# Patient Record
Sex: Female | Born: 1970 | Race: Black or African American | Hispanic: No | Marital: Single | Smoking: Never smoker
Health system: Southern US, Community
[De-identification: ages and names within clinical notes are randomized; demographics above are authoritative.]

## PROBLEM LIST (undated history)

## (undated) DIAGNOSIS — N938 Other specified abnormal uterine and vaginal bleeding: Secondary | ICD-10-CM

## (undated) DIAGNOSIS — K5900 Constipation, unspecified: Secondary | ICD-10-CM

## (undated) DIAGNOSIS — E119 Type 2 diabetes mellitus without complications: Secondary | ICD-10-CM

## (undated) DIAGNOSIS — F3281 Premenstrual dysphoric disorder: Secondary | ICD-10-CM

## (undated) DIAGNOSIS — F32A Depression, unspecified: Secondary | ICD-10-CM

## (undated) DIAGNOSIS — I10 Essential (primary) hypertension: Secondary | ICD-10-CM

## (undated) DIAGNOSIS — E785 Hyperlipidemia, unspecified: Secondary | ICD-10-CM

## (undated) DIAGNOSIS — D219 Benign neoplasm of connective and other soft tissue, unspecified: Secondary | ICD-10-CM

## (undated) DIAGNOSIS — F329 Major depressive disorder, single episode, unspecified: Secondary | ICD-10-CM

## (undated) HISTORY — DX: Other specified abnormal uterine and vaginal bleeding: N93.8

## (undated) HISTORY — DX: Essential (primary) hypertension: I10

## (undated) HISTORY — DX: Depression, unspecified: F32.A

## (undated) HISTORY — DX: Type 2 diabetes mellitus without complications: E11.9

## (undated) HISTORY — DX: Constipation, unspecified: K59.00

## (undated) HISTORY — DX: Major depressive disorder, single episode, unspecified: F32.9

## (undated) HISTORY — DX: Benign neoplasm of connective and other soft tissue, unspecified: D21.9

## (undated) HISTORY — DX: Premenstrual dysphoric disorder: F32.81

---

## 2006-02-01 HISTORY — PX: OTHER SURGICAL HISTORY: SHX169

## 2007-02-02 HISTORY — PX: APPENDECTOMY: SHX54

## 2008-10-02 ENCOUNTER — Ambulatory Visit: Payer: Self-pay | Admitting: Obstetrics & Gynecology

## 2009-02-06 ENCOUNTER — Ambulatory Visit: Payer: Self-pay | Admitting: Obstetrics and Gynecology

## 2009-02-07 ENCOUNTER — Ambulatory Visit (HOSPITAL_COMMUNITY): Admission: RE | Admit: 2009-02-07 | Discharge: 2009-02-07 | Payer: Self-pay | Admitting: Family Medicine

## 2009-02-20 ENCOUNTER — Other Ambulatory Visit: Admission: RE | Admit: 2009-02-20 | Discharge: 2009-02-20 | Payer: Self-pay | Admitting: Obstetrics & Gynecology

## 2009-02-20 ENCOUNTER — Ambulatory Visit: Payer: Self-pay | Admitting: Obstetrics and Gynecology

## 2009-03-06 ENCOUNTER — Ambulatory Visit: Payer: Self-pay | Admitting: Obstetrics and Gynecology

## 2009-03-25 ENCOUNTER — Ambulatory Visit: Payer: Self-pay | Admitting: Family Medicine

## 2009-04-28 ENCOUNTER — Encounter: Admission: RE | Admit: 2009-04-28 | Discharge: 2009-04-28 | Payer: Self-pay | Admitting: Family Medicine

## 2009-04-28 ENCOUNTER — Telehealth (INDEPENDENT_AMBULATORY_CARE_PROVIDER_SITE_OTHER): Payer: Self-pay | Admitting: *Deleted

## 2009-04-28 ENCOUNTER — Ambulatory Visit: Payer: Self-pay | Admitting: Family Medicine

## 2009-04-28 DIAGNOSIS — M545 Low back pain, unspecified: Secondary | ICD-10-CM | POA: Insufficient documentation

## 2009-04-28 DIAGNOSIS — M549 Dorsalgia, unspecified: Secondary | ICD-10-CM | POA: Insufficient documentation

## 2009-04-28 DIAGNOSIS — M19019 Primary osteoarthritis, unspecified shoulder: Secondary | ICD-10-CM | POA: Insufficient documentation

## 2009-05-02 ENCOUNTER — Encounter (INDEPENDENT_AMBULATORY_CARE_PROVIDER_SITE_OTHER): Payer: Self-pay | Admitting: *Deleted

## 2009-05-15 ENCOUNTER — Telehealth: Payer: Self-pay | Admitting: Family Medicine

## 2009-05-21 ENCOUNTER — Ambulatory Visit: Payer: Self-pay | Admitting: Family Medicine

## 2009-05-21 ENCOUNTER — Encounter: Payer: Self-pay | Admitting: Family Medicine

## 2009-05-21 LAB — CONVERTED CEMR LAB
Cholesterol: 264 mg/dL — ABNORMAL HIGH (ref 0–200)
HDL: 50 mg/dL (ref >39)
LDL Cholesterol: 183 mg/dL — ABNORMAL HIGH (ref 0–99)
Triglycerides: 156 mg/dL — ABNORMAL HIGH (ref <150)

## 2009-05-26 ENCOUNTER — Ambulatory Visit: Payer: Self-pay | Admitting: Family Medicine

## 2009-06-03 ENCOUNTER — Encounter: Admission: RE | Admit: 2009-06-03 | Discharge: 2009-06-26 | Payer: Self-pay | Admitting: Family Medicine

## 2009-06-09 ENCOUNTER — Telehealth: Payer: Self-pay | Admitting: Family Medicine

## 2009-06-09 DIAGNOSIS — E785 Hyperlipidemia, unspecified: Secondary | ICD-10-CM | POA: Insufficient documentation

## 2009-07-25 ENCOUNTER — Encounter: Payer: Self-pay | Admitting: *Deleted

## 2009-08-13 ENCOUNTER — Ambulatory Visit (HOSPITAL_COMMUNITY): Payer: Self-pay | Admitting: Psychology

## 2009-08-20 ENCOUNTER — Ambulatory Visit (HOSPITAL_COMMUNITY): Payer: Self-pay | Admitting: Psychology

## 2009-09-05 ENCOUNTER — Ambulatory Visit: Payer: Self-pay | Admitting: Obstetrics & Gynecology

## 2009-10-10 ENCOUNTER — Ambulatory Visit: Payer: Self-pay | Admitting: Obstetrics & Gynecology

## 2009-10-10 ENCOUNTER — Telehealth: Payer: Self-pay | Admitting: Psychology

## 2009-11-03 ENCOUNTER — Telehealth: Payer: Self-pay | Admitting: Psychology

## 2009-11-25 ENCOUNTER — Ambulatory Visit (HOSPITAL_COMMUNITY): Payer: Self-pay | Admitting: Psychiatry

## 2009-12-04 ENCOUNTER — Ambulatory Visit (HOSPITAL_COMMUNITY): Payer: Self-pay | Admitting: Psychology

## 2009-12-24 ENCOUNTER — Ambulatory Visit (HOSPITAL_COMMUNITY)
Admission: RE | Admit: 2009-12-24 | Discharge: 2009-12-24 | Payer: Self-pay | Source: Home / Self Care | Admitting: Family Medicine

## 2009-12-24 ENCOUNTER — Ambulatory Visit: Payer: Self-pay | Admitting: Family Medicine

## 2009-12-24 ENCOUNTER — Encounter: Payer: Self-pay | Admitting: Family Medicine

## 2009-12-24 DIAGNOSIS — Z8679 Personal history of other diseases of the circulatory system: Secondary | ICD-10-CM | POA: Insufficient documentation

## 2009-12-24 DIAGNOSIS — N943 Premenstrual tension syndrome: Secondary | ICD-10-CM | POA: Insufficient documentation

## 2009-12-24 DIAGNOSIS — B80 Enterobiasis: Secondary | ICD-10-CM

## 2009-12-24 LAB — CONVERTED CEMR LAB
ALT: 26 units/L (ref 0–35)
AST: 26 units/L (ref 0–37)
Alkaline Phosphatase: 64 units/L (ref 39–117)
Bilirubin, Direct: 0.1 mg/dL (ref 0.0–0.3)
Total Bilirubin: 0.7 mg/dL (ref 0.3–1.2)
Total Protein: 6.9 g/dL (ref 6.0–8.3)

## 2009-12-30 ENCOUNTER — Ambulatory Visit (HOSPITAL_COMMUNITY): Payer: Self-pay | Admitting: Psychology

## 2010-01-05 ENCOUNTER — Telehealth: Payer: Self-pay | Admitting: Family Medicine

## 2010-01-16 ENCOUNTER — Ambulatory Visit (HOSPITAL_COMMUNITY): Payer: Self-pay | Admitting: Psychiatry

## 2010-01-16 ENCOUNTER — Ambulatory Visit: Payer: Self-pay | Admitting: Family Medicine

## 2010-01-16 DIAGNOSIS — M79609 Pain in unspecified limb: Secondary | ICD-10-CM | POA: Insufficient documentation

## 2010-01-16 DIAGNOSIS — R109 Unspecified abdominal pain: Secondary | ICD-10-CM | POA: Insufficient documentation

## 2010-01-28 ENCOUNTER — Ambulatory Visit: Payer: Self-pay | Admitting: Family Medicine

## 2010-02-06 ENCOUNTER — Ambulatory Visit: Admit: 2010-02-06 | Payer: Self-pay | Admitting: Obstetrics & Gynecology

## 2010-02-09 ENCOUNTER — Ambulatory Visit (HOSPITAL_BASED_OUTPATIENT_CLINIC_OR_DEPARTMENT_OTHER)
Admission: RE | Admit: 2010-02-09 | Discharge: 2010-02-09 | Payer: Self-pay | Source: Home / Self Care | Attending: Family Medicine | Admitting: Family Medicine

## 2010-02-09 ENCOUNTER — Ambulatory Visit
Admission: RE | Admit: 2010-02-09 | Discharge: 2010-02-09 | Payer: Self-pay | Source: Home / Self Care | Attending: Obstetrics and Gynecology | Admitting: Obstetrics and Gynecology

## 2010-02-12 ENCOUNTER — Ambulatory Visit (HOSPITAL_COMMUNITY)
Admission: RE | Admit: 2010-02-12 | Discharge: 2010-02-12 | Payer: Self-pay | Source: Home / Self Care | Attending: Obstetrics and Gynecology | Admitting: Obstetrics and Gynecology

## 2010-02-23 ENCOUNTER — Ambulatory Visit: Admit: 2010-02-23 | Payer: Self-pay

## 2010-03-02 ENCOUNTER — Ambulatory Visit (HOSPITAL_COMMUNITY): Admit: 2010-03-02 | Payer: Self-pay | Admitting: Psychology

## 2010-03-03 NOTE — Progress Notes (Signed)
Summary: results  Phone Note Call from Patient Call back at Home Phone [phone]   Caller: Patient Summary of Call: pt is wanting results of labs  Initial call taken by: De Nurse,  Jun 09, 2009 3:20 PM  Follow-up for Phone Call        discussed the results of her lipid test. gave her suggestions & examples of how she can lower her cholesterol. urged daily exercise even if it is only 10 minutes in am & in pm. weighs about 170 per pt.   told her mds usually send a letter outlining the results & giving examples. to pcp to send this letter  she will look this up on her computer & get further examples of how to cut cholesterol Follow-up by: Golden Circle RN,  Jun 09, 2009 3:27 PM  Additional Follow-up for Phone Call Additional follow up Details #1::        Discussed results of labs w/ pt as well as plans diet/lifestyle changes in addition to likely starting statin in near future w/ planned CMET prior to starting to evaluate LFTs. Pt agreeable to plan.  Doree Albee MD   New Problems: HYPERLIPIDEMIA (ICD-272.4)   New Problems: HYPERLIPIDEMIA (ICD-272.4)

## 2010-03-03 NOTE — Progress Notes (Signed)
  Phone Note Outgoing Call   Summary of Call: Jevonte Clanton plz call her and tell her xrays were normal Thanks!  Denny Levy MD  April 28, 2009 4:28 PM

## 2010-03-03 NOTE — Progress Notes (Signed)
Summary: labs  Phone Note Call from Patient Call back at Home Phone [phone]   Caller: Patient Summary of Call: would like labs for her chlolesterol & other things that were discussed at last visit.  she made an appt for 4/20 for her labs and would like orders put in for her.  pls advise Initial call taken by: De Nurse,  May 15, 2009 1:33 PM  Follow-up for Phone Call        Lipid prrofile placed in centricity. Plan to see pt on 4/20. Thank You. Doree Albee MD  New Problems: HEALTH SCREENING (ICD-V70.0)   New Problems: HEALTH SCREENING (ICD-V70.0)

## 2010-03-03 NOTE — Progress Notes (Signed)
Summary: Lab Followup  Phone Note Call from Patient   Caller: Patient Call For: [phone] Summary of Call: Please call patient with results of labs recently taken.   Initial call taken by: Abundio Miu,  January 05, 2010 2:01 PM  Follow-up for Phone Call        Call and spoke with pt about lab results. Will start pt on pravachol. Discussed side effect red flags. Will followup in 2 weeks. Pt agreeable to plan.  Doree Albee MD January 05, 2010 3:52 PM      New/Updated Medications: PRAVACHOL 40 MG TABS (PRAVASTATIN SODIUM) 1 tab daily Prescriptions: PRAVACHOL 40 MG TABS (PRAVASTATIN SODIUM) 1 tab daily  #30 x 6   Entered and Authorized by:   Doree Albee MD   Signed by:   Doree Albee MD on 01/05/2010   Method used:   Electronically to        [facility] [address] (retail)       [address]       [place], [place]  [address]       Ph: [phone] or [phone]       Fax: [phone]   RxID:   [id number]

## 2010-03-03 NOTE — Progress Notes (Signed)
Summary: Schedule [facility]  Phone Note Call from Patient   Caller: Patient Call For: Spero Geralds, Psy.D. Summary of Call: Patient called to schedule a therapy appt.  She was seeing Dr. Denyse Amass over at [facility] and he recommended it.  We scheduled for first available for a new patient:  Thursday, October 6th at 2:00. Initial call taken by: Spero Geralds PsyD,  October 10, 2009 10:13 AM

## 2010-03-03 NOTE — Assessment & Plan Note (Signed)
Summary: pin worms/muscle chest pain,df   Vital Signs:  Patient profile:   40 year old female Weight:      176.1 pounds Temp:     98.8 degrees F oral Pulse rate:   87 / minute Pulse rhythm:   regular BP sitting:   126 / 86  (left arm) Cuff size:   large  Vitals Entered By: Loralee Pacas CMA (December 24, 2009 10:26 AM) CC: ? pin worms and muscle strain , Lipid Management   Primary Care Provider:  Doree Albee MD  CC:  ? pin worms and muscle strain  and Lipid Management.  History of Present Illness: Mood: Pt reports improved mood with zoloft. has only been taking medication for 2 weeks vs. 2 months as previously prescribed. Feels that irritability associated w/ PMDD is somewhat improved.   Chest pain: pt reports chest pain for last 4-5 days. CP central in nature, assd w/ movement. Non exetional. No N/V/diaphoresis. No radiation. Feels that pain occured after episode of "aggressive sex".   Pin worms: Pt reports anal itching for last 1-2 months. Worse at night and in am. Has had pin worms in the past and feels that sxs are similar. No recent anal intercourse, hx/o external hemmorhoids.   Lipid Management History:      Negative NCEP/ATP III risk factors include female age less than 81 years old, non-tobacco-user status, non-hypertensive, no ASHD (atherosclerotic heart disease), no prior stroke/TIA, no peripheral vascular disease, and no history of aortic aneurysm.        The patient states that she knows about the "Therapeutic Lifestyle Change" diet.  Her compliance with the TLC diet is poor.  The patient expresses understanding of adjunctive measures for cholesterol lowering.  The patient denies any symptoms to suggest myopathy or liver disease.  Comments include: Pt reports eating high fat diet. Has not done some lifestyle modifcation w/ intermittent exercise. .     Habits & Providers  Alcohol-Tobacco-Diet     Tobacco Status: never  Exercise-Depression-Behavior     Have you felt  down or hopeless? no     Have you felt little pleasure in things? no     Depression Counseling: not indicated; screening negative for depression     Seat Belt Use: always  Social History: Risk analyst Use:  always  Physical Exam  General:  alert and well-developed.   Head:  normocephalic and atraumatic.   Eyes:  vision grossly intact.   Ears:  R ear normal and L ear normal.   Nose:  no external deformity.   Mouth:  good dentition.   Neck:  supple and full ROM.   Chest Wall:  + tenderness to palpation to central chest  Lungs:  normal respiratory effort and normal breath sounds.   Heart:  normal rate, regular rhythm, and no murmur.   Abdomen:  S/NT/+ bowel sounds  Additional Exam:  EKG-NSR   Impression & Recommendations:  Problem # 1:  PMDD (ICD-625.4) Mood improved with zoloft. Will reassess in 2-4 weeks. Pt w/ overall history of poor compliance. Stressed importance of compliance with medication for adequate treamtent. Pt agreeable to plan.   Problem # 2:  CHEST PAIN, HX OF (ICD-V12.50) Likely costochondritis. Instructed to use tylenol as NSAIDs cause stomach pain. EKG in office WNL.  Orders: 12 Lead EKG (12 Lead EKG) [facility]- Est Level  3 (57846)  Problem # 3:  HYPERLIPIDEMIA (ICD-272.4) Readdressed diet and lifestyle modification. Will recheck lipids. WIll likely need statin in future.  Orders: Direct LDL-[facility] (69629-52841) [facility]- Est Level  3 (32440)  Problem # 4:  PINWORMS (ICD-127.4) Will rx antihelminthic for tx. Addressed good hygeine as well as possible need for partner tx if sxs recur. Pt agreeable to plan.   Complete Medication List: 1)  Flexeril 10 Mg Tabs (Cyclobenzaprine hcl) .Marland Kitchen.. 1 by mouth  qhs as needed for back spasm 2)  Flexeril 10 Mg Tabs (Cyclobenzaprine hcl) .... Take one half  by mouth  qhs as needed for back spasm 3)  Mebendazole 100 Mg Chew (Mebendazole) .... Take 1 tablet once and in 2 weeks  Other Orders: T-Hepatic Function (10272-53664)  Lipid  Assessment/Plan:      Based on NCEP/ATP III, the patient's risk factor category is "0-1 risk factors".  The patient's lipid goals are as follows: Total cholesterol goal is 200; LDL cholesterol goal is 160; HDL cholesterol goal is 40; Triglyceride goal is 150.    Patient Instructions: 1)  It was good to see you today 2)  Your chest pain is likely from a strain in your chest wall, but we will do an ekg given your history of high cholesterol 3)  You can use tylenol for pain as ibuprofen hurts your stomach 4)  We will also check some cholesterol labs and you may need medical treatment in the future 5)  I will prescribe some medication for pinworms. Use as directed 6)  May sure to wash all of your sheets as well as use very good hand hygiene 7)  Your mood seems to be doing better, continue with the zoloft regimen and come back to see me in 4 weeks 8)  Otherwise call with any questions,  9)  God Bless and Happy Thanksgiving 10)  Doree Albee MD  Prescriptions: MEBENDAZOLE 100 MG CHEW (MEBENDAZOLE) take 1 tablet once and in 2 weeks  #2 x 0   Entered and Authorized by:   Doree Albee MD   Signed by:   Doree Albee MD on 12/29/2009   Method used:   Electronically to        [facility] [address] (retail)       [address]       [place], [place]  [address]       Ph: [phone] or [phone]       Fax: [phone]   RxID:   [id number]    Orders Added: 1)  T-Hepatic Function [phone] 2)  12 Lead EKG [12 Lead EKG] 3)  Direct LDL-[facility] [[id number]] 4)  [facility]- Est Level  3 [83151]     Prevention & Chronic Care Immunizations   Influenza vaccine: Not documented    Tetanus booster: Not documented   Tetanus booster due: 12/01/2011    Pneumococcal vaccine: Not documented  Other Screening   Pap smear: LOW GRADE SQUAMOUS INTRAEPITHELIAL LESION: CIN-1/ VAIN-1/ HPV. (LSIL)  (02/06/2009)    Mammogram: Not documented   Mammogram due:  04/01/2010   Smoking status: never  (12/24/2009)  Lipids   Total Cholesterol: 264  (05/21/2009)   Lipid panel action/deferral: Lipid Panel ordered   LDL: 183  (05/21/2009)   LDL Direct: Not documented   HDL: 50  (05/21/2009)   Triglycerides: 156  (05/21/2009)    SGOT (AST): Not documented   BMP action: Ordered   SGPT (ALT): Not documented   Alkaline phosphatase: Not documented   Total bilirubin: Not documented    Lipid flowsheet reviewed?: Yes  Progress toward LDL goal: Unchanged  Self-Management Support :    Patient will work on the following items until the next clinic visit to reach self-care goals:     Eating: drink diet soda or water instead of juice or soda, eat more vegetables, eat foods that are low in salt, eat baked foods instead of fried foods, eat fruit for snacks and desserts, limit or avoid alcohol  (12/24/2009)     Activity: take a 30 minute walk every day  (12/24/2009)    Lipid self-management support: Referred for medical nutrition therapy, Referred for medical nutrition therapy  (12/24/2009)    Nursing Instructions: Give Flu vaccine today Refer for medical nutrition therapy (see order) Refer for medical nutrition therapy (see order)    Diabetes Self Management Training Referral Patient Name: Diane Sexton Date Of Birth: 02-23-1970 MRN: [id number] Current Diagnosis:  PINWORMS (ICD-127.4) PMDD (ICD-625.4) CHEST PAIN, HX OF (ICD-V12.50) HYPERLIPIDEMIA (ICD-272.4) HEALTH SCREENING (ICD-V70.0) DEGENERATIVE JOINT DISEASE, 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

## 2010-03-03 NOTE — Assessment & Plan Note (Signed)
Summary: SHOULDER PAIN/MJD   Vital Signs:  Patient profile:   40 year old female BP sitting:   127 / 88  Vitals Entered By: Lillia Pauls CMA (April 28, 2009 2:50 PM)  Primary Care Provider:  Doree Albee MD  CC:  L shoulder pain x 1 mo and LBP x 6 mos.  History of Present Illness: 40 y/o F with L shoulder x 1 mo and back pain x 6 mos  L Shoulder pain:  pain that is constant.  Cannot recall injury.  Cannot sleep because of pain.  Pain is described as throbbing.  Located from clavicle to [facility] joint.  No radiation to arm or hand.  Has tried otc pain meds, but they have not helped.  Has full ROM, no pain with reaching or lifting.    Low back pain: No injury.  Cramping pain that is intermittent.  No urinary or bowel symptoms.  No radiation  to lower legs, no saddle distribution numbness.  Worse when standing still for long period (10-15 minutes).  This pain is relieved with walking.  Pain worse with flexion.  Pain worse after sitting for a long time (1 hr on couch).  Has gained about 12 lbs over the past year and is wondering if that may contribute to the pain.      Past History:  Past Medical History: Last updated: 03/25/2009 Hx/o LGSIL  Hx/o uterine fibroids Hyperlipidemia hx/o PMDD   Past Surgical History: Last updated: 03/25/2009 Uterine artery embolization 2007 Appendectomy 2010  Family History: Last updated: 03/25/2009 Family History Diabetes 1st degree relative Family History Hypertension Hx/o MI in [place]   Social History: Last updated: 03/25/2009 Recently laid off 1/10 Occasional wine use Single Never Smoked Drug use-no Regular exercise-no  Review of Systems       per hpi  Physical Exam  Msk:  L shoulder extreme tenderness over AC joint and this reproduces her pain as does crossover test aggravate pain. rotator cuff muscles are intact for strength, some mild pain w supraspinatus testing. bicep and deltoid function intact. distally neurovascularly  intact  BACK: inspection revealks no deformity. flexion and extension at hips is normal. B SLR in seated and supine position negative. Area of tenderness to deep palpation around T 12-L2.lateral side bendng limited secondary to tight mscles. LE strength at hip flexors and extensors normal. 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

## 2010-03-03 NOTE — Assessment & Plan Note (Signed)
Summary: Diane Sexton,Diane Sexton   Vital Signs:  Patient profile:   40 year old female Height:      65 inches Weight:      177 pounds BMI:     29.56 BSA:     1.88 Temp:     98.4 degrees F Pulse rate:   114 / minute BP sitting:   139 / 82  Vitals Entered By: Jone Baseman CMA (March 25, 2009 2:09 PM)  Serial Vital Signs/Assessments:  Comments: 2:11 PM Manual BP: 122/80 By: Jone Baseman CMA   CC: new patient Is Patient Diabetic? No Pain Assessment Patient in pain? no        CC:  new patient.  History of Present Illness: Diane Sexton here for new pt appt. Pt currently w/o specific complaints. Pt w/ healthcare in [place] previously.  Pt denies any hx/o HTN, DM, Heart disease. Pt does report hx/o hyperlipidemia to which has been intermittently treated w/ statin medication. Pt up to date on pap smear, mammogram, and tetanus shot per pt. Pt transitioning care as pt has been unemployed for greater than 1 year and has lost previous insurance coverage. Pt does report 10-20+ weight gain as pt has been inactive since being laid off from work.   Habits & Providers  Alcohol-Tobacco-Diet     Tobacco Status: never  Exercise-Depression-Behavior     Does Patient Exercise: no     Drug Use: no  Past History:  Past Medical History: Hx/o LGSIL  Hx/o uterine fibroids Hyperlipidemia hx/o PMDD   Past Surgical History: Uterine artery embolization 2007 Appendectomy 2010  Family History: Family History Diabetes 1st degree relative Family History Hypertension Hx/o MI in [place]   Social History: Recently laid off 1/10 Occasional wine use Single Never Smoked Drug use-no Regular exercise-no Smoking Status:  never Drug Use:  no Does Patient Exercise:  no  Review of Systems       The patient complains of weight gain.  The patient denies anorexia, vision loss, decreased hearing, chest pain, syncope, dyspnea on exertion, peripheral edema, headaches, abdominal pain, melena, severe  indigestion/heartburn, incontinence, depression, abnormal bleeding, and angioedema.    Physical Exam  General:  alert, well-developed, and well-nourished.   Head:  normocephalic and atraumatic.   Eyes:  vision grossly intact.   Ears:  R ear normal and L ear normal.   Nose:  no external deformity.   Mouth:  good dentition and pharynx pink and moist.   Neck:  supple and full ROM.   Chest Wall:  no deformities.   Lungs:  normal respiratory effort, no accessory muscle use, and normal breath sounds.   Heart:  normal rate, regular rhythm, no murmur, no gallop, no rub, and no JVD.   Abdomen:  soft, non-tender, normal bowel sounds, no distention, no masses, no guarding, and no rigidity.   Msk:  normal ROM, no joint tenderness, no joint swelling, no joint warmth, and no redness over joints.   Extremities:  No edema Neurologic:  alert & oriented X3, cranial nerves II-XII intact, and strength normal in all extremities.   Skin:  turgor normal.   Cervical Nodes:  no anterior cervical adenopathy.   Axillary Nodes:  no R axillary adenopathy and no L axillary adenopathy.   Psych:  Oriented X3 and memory intact for recent and remote.     Impression & Recommendations:  Problem # 1:  Preventive Health Care (ICD-V70.0) Pt presenting asymptomatic w/ medical screening up to date per [facility] preventative care protocol. Will  plan to obtain lipid panel as pt in setting of hx/o HLD has not been on medication for >1 year and no lipid check in several years per pt. Repeat BP in clinic non concering for HTN in setting of pt denying hx/o. Will also obtain records from previous PCP. In setting of hx/o LGSIL, uterine fibroids, as well as PMDD, pt currently actively seeing [facility] for GYN care. Plan to follow OB-Gyn recomendations. Will aslo plan for pt tp followup w/ nutrition in next 1-2 months for advisement in diet and overall lifestyle management.    Prevention & Chronic Care Immunizations   Influenza  vaccine: Not documented    Tetanus booster: Not documented   Tetanus booster due: 12/01/2011    Pneumococcal vaccine: Not documented  Other Screening   Pap smear: LOW GRADE SQUAMOUS INTRAEPITHELIAL LESION: CIN-1/ VAIN-1/ HPV. (LSIL)  (02/06/2009)    Mammogram: Not documented   Smoking status: never  (03/25/2009)  Lipids   Total Cholesterol: Not documented   LDL: Not documented   LDL Direct: Not documented   HDL: Not documented   Triglycerides: Not documented

## 2010-03-03 NOTE — Assessment & Plan Note (Signed)
Summary: BP check  Nurse Visit patient in for labs and requested BP check. BP checked manually using regular adult cuff. BP LA 120/92 , RA 118/92, pulse 80. will forward message to MD. Theresia Lo RN  May 21, 2009 9:03 AM   Orders Added: 1)  No Charge Patient Arrived (NCPA0) [[id number]]

## 2010-03-03 NOTE — Progress Notes (Signed)
Summary: Diane Sexton appt  Phone Note Call from Patient   Caller: Patient Call For: Spero Geralds, Psy.D. Summary of Call: Left a VM today to cancel her Thursday appt.  I called her back to reschedule, per her request, and left a VM. Initial call taken by: Spero Geralds PsyD,  November 03, 2009 12:12 PM

## 2010-03-03 NOTE — Letter (Signed)
Summary: [facility] Ref[facility] form  [facility] Referral form   Imported By: Marily Memos 05/27/2009 09:45:17  _____________________________________________________________________  External Attachment:    Type:   Image     Comment:   External Document

## 2010-03-03 NOTE — Assessment & Plan Note (Signed)
Summary: F/U,[facility]   Vital Signs:  Patient profile:   40 year old female BP sitting:   127 / 81  Vitals Entered By: Lillia Pauls CMA (May 26, 2009 1:48 PM)  Primary Care Provider:  Doree Albee MD   History of Present Illness: f/u left shoulder pain DATE of INJURY (approximate): 03/31/2009 f/u back pain DATE of INJURY (approximate): 10/29/2009  1) shoulder---99% better. Hurt for a couple of days after injection then has essentaially totally resolved pain. has FROM.   2) back pain--no better. She did exercises for about 2 weeks and it seemed not to help. Pain is B in thoracolumbar area--worse withextended standing. Some better w the flexeril I gave her and she would like some more of that. no radiation to legs, no change in bowel or bladder habits.   Physical Exam  Msk:  B shoulders FROm (painless)  BACK. tender to palpation on thoraco lumbar spine muscles and tehre is some spasm noted B. Limited hyper-extension to about 15 degrees and increases her pain. 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 Will refer for PT and f/u 4 w ok to continue fllexeril we gave her some samples we had here today (# 20 of 10 mg tabs)  Complete Medication List: 1)  Flexeril 10 Mg Tabs (Cyclobenzaprine hcl) .Marland Kitchen.. 1 by mouth  qhs as needed for back spasm 2)  Flexeril 10 Mg Tabs (Cyclobenzaprine hcl) .... Take one half  by mouth  qhs as needed for back spasm

## 2010-03-03 NOTE — Letter (Signed)
Summary: Results Follow-up Letter  [facility]  [address]   [place], [place] [address]   Phone: [phone]  Fax: [phone]    05/02/2009  [address] [place], [place]  [address]  Dear Ms. Sainsbury,   The following are the results of your recent test(s):  Test     Result     Pap Smear    Normal_______  Not Normal_____       Comments: _________________________________________________________ Cholesterol LDL(Bad cholesterol):          Your goal is less than:         HDL (Good cholesterol):        Your goal is more than: _________________________________________________________ Other Tests:  Your x-rays were normal _________________________________________________________  Please call for an appointment Or _________________________________________________________ _________________________________________________________ _________________________________________________________  Sincerely,  Denny Levy, M.D./Alecxis Baltzell Christell Constant [facility]           Appended Document: Results Follow-up Letter

## 2010-03-03 NOTE — Letter (Signed)
Summary: Probation Letter  [facility]  [address]   [place], [place] [address]   Phone: [phone]  Fax: [phone]    07/25/2009  KAWANDA DRUMHELLER Deg[place] [address] [place], [place]  [address]  Dear Ms. Goeser,  With the goal of better serving all our patients the [facility] is following each patient's missed appointments.  You have missed at least 3 appointments with our practice.If you cannot keep your appointment, we expect you to call at least 24 hours before your appointment time.  Missing appointments prevents other patients from seeing [place] and makes it difficult to provide you with the best possible medical care.      1.   If you miss one more appointment, we will only give you limited medical services. This means we will not call in medication refills, complete a form, or make a referral for you except when you are here for a scheduled office visit.    2.   If you miss 2 or more appointments in the next year, we will dismiss you from our practice.    Our office staff can be reached at [phone] Monday through Friday from 8:30 a.m.-5:00 p.m. and will be glad to schedule your appointment as necessary.    Thank you.   [facility]  Appended Document: Probation Letter mailed

## 2010-03-05 NOTE — Assessment & Plan Note (Signed)
Summary: f/u eo   Vital Signs:  Patient profile:   40 year old female Height:      65 inches Weight:      177.9 pounds BMI:     29.71 Temp:     98.3 degrees F oral Pulse rate:   84 / minute BP sitting:   123 / 80  (right arm) Cuff size:   regular  Vitals Entered By: Jimmy Footman, CMA (January 28, 2010 2:49 PM) CC: follow up pinworm, concerned about family history of Dm Is Patient Diabetic? No Pain Assessment Patient in pain? no        Primary Care Provider:  Doree Albee MD  CC:  follow up pinworm and concerned about family history of Dm.  History of Present Illness: Broke up with partner around 12/10. Had menstrual cycle 12/15. Feels that mood has been otherwise stable, but breakup with partner made patient feel very sad and depressed for approximately 1 week.  Unsure if PMDD played a part in mood and breakup.  Doesnt think that  mood has become worse since starting zoloft, but unsure if mood has become significantly better.   Pinworms: No more anal itching/irritation per pt since completing course of mebendazole.    HLD: has not started pravachol as previously instructed. Was concerned about price since she is unemployed. Has not made significant changes in diet.   DM:  Pt concerned about having diabetes as there is a strong family history. No polyuria/polydypsia per pt. Is desiring blood sugar check.   Habits & Providers  Alcohol-Tobacco-Diet     Tobacco Status: never  Allergies: No Known Drug Allergies  Physical Exam  General:  alert and well-developed.   Head:  atraumatic.   Eyes:  vision grossly intact.   Neck:  supple and full ROM.   Lungs:  CTAB, no wheezes, rales, rhoncii Heart:  RRR, no rubs, gallops, murmurs  Abdomen:  S/non tender/+ bowel sounds  Extremities:  2+ peripheral pulses, no edema Neurologic:  cranial nerves II-XII intact.   Psych:  Oriented X3, normally interactive, and good eye contact.   PHQ-9 score of 5    Impression &  Recommendations:  Problem # 1:  PMDD (ICD-625.4) Will continue with zoloft. Will follow up in 1 month to reassess effect of zoloft on PMDD. Zoloft originally rxd by behavioral health. On subtherapeutic dose currently.  Will likely increase at next visit.  Orders: [facility]- Est  Level 4 (13086)  Problem # 2:  PINWORMS (ICD-127.4) Assessment: Improved Resolved  Orders: [facility]- Est  Level 4 (57846)  Problem # 3:  HYPERLIPIDEMIA (ICD-272.4) Assessment: Unchanged Pt instructed to begin taking pravachol. Told pt that medication is on [facility] 4 dollar list. Will follow up in 1 month. Will likely refer pt to nutrition as next step in management.  Her updated medication list for this problem includes:    Pravachol 40 Mg Tabs (Pravastatin sodium) .Marland Kitchen... 1 tab daily  Orders: [facility]- Est  Level 4 (99214)  Complete Medication List: 1)  Flexeril 10 Mg Tabs (Cyclobenzaprine hcl) .Marland Kitchen.. 1 by mouth  qhs as needed for back spasm 2)  Flexeril 10 Mg Tabs (Cyclobenzaprine hcl) .... Take one half  by mouth  qhs as needed for back spasm 3)  Pravachol 40 Mg Tabs (Pravastatin sodium) .Marland Kitchen.. 1 tab daily 4)  Zoloft 25 Mg Tabs (Sertraline hcl) .Marland Kitchen.. 1 by mouth daily per mental health for pmdd  Other Orders: Glucose Cap-[facility] (96295)  Patient Instructions: 1)  Your mood seems  stable.  2)  Come back in 1 month and we will adress your mood again.  3)  Start taking the pravachol as prescribed  4)  Followup with me in 65month 5)  Otherwise call with any questions, 6)  Happy New Year and God [place], 7)  Doree Albee MD    Orders Added: 1)  [facility]- Est  Level 4 [16109] 2)  Glucose Cap-[facility] [60454]     Prevention & Chronic Care Immunizations   Influenza vaccine: Not documented    Tetanus booster: Not documented   Tetanus booster due: 12/01/2011    Pneumococcal vaccine: Not documented  Other Screening   Pap smear: LOW GRADE SQUAMOUS INTRAEPITHELIAL LESION: CIN-1/ VAIN-1/ HPV. (LSIL)  (02/06/2009)    Mammogram: Not  documented   Mammogram due: 04/01/2010   Smoking status: never  (01/28/2010)  Lipids   Total Cholesterol: 264  (05/21/2009)   Lipid panel action/deferral: Lipid Panel ordered   LDL: 183  (05/21/2009)   LDL Direct: 174  (12/24/2009)   HDL: 50  (05/21/2009)   Triglycerides: 156  (05/21/2009)    SGOT (AST): 26  (12/24/2009)   BMP action: Ordered   SGPT (ALT): 26  (12/24/2009)   Alkaline phosphatase: 64  (12/24/2009)   Total bilirubin: 0.7  (12/24/2009)    Lipid flowsheet reviewed?: Yes   Progress toward LDL goal: Unchanged  Self-Management Support :    Lipid self-management support: Referred for medical nutrition therapy, Referred for medical nutrition therapy  (12/24/2009)     Appended Document: f/u eo CBG at 69 on spot check. WNL. Discussed with pt.  Doree Albee MD

## 2010-03-05 NOTE — Assessment & Plan Note (Signed)
Summary: consistent pain on rt side/eo   Vital Signs:  Patient profile:   40 year old female Height:      65 inches Weight:      172.3 pounds Temp:     98.2 degrees F oral Pulse rate:   75 / minute BP sitting:   118 / 87  (right arm) Cuff size:   large  Vitals Entered By: Arlyss Repress CMA, (January 16, 2010 10:14 AM) CC: pain in right bottom of foot, leg...pain started in right abdomen.  Is Patient Diabetic? No Pain Assessment Patient in pain? yes     Location: right foot Intensity: 1 Onset of pain  x1 week   Primary Care Provider:  Doree Albee MD  CC:  pain in right bottom of foot and leg...pain started in right abdomen. Marland Kitchen  History of Present Illness:    Pain in right side of abdomen started 1 week ago woke her from sleep. Intermittant sharp pain in abdomen feels like stabbing, pain radiates to rigjht thigh and sole of foot. No aggraviting factors, no associated Nausea, vomtiing, fever, diarrhea, weakness in leg, no falls, no dysuria, no discharge No history of pains before, no back pain, no recent illness  Denies heartburn or gas, no change in color of stools Note has some loose stools since starting Zoloft but not diarrheal    Pt did state she has stress over the holidays because she has broken up with her Girlfriend again and she does not want to speak to her right now, has family around for the holidays  Habits & Providers  Alcohol-Tobacco-Diet     Tobacco Status: never  Current Medications (verified): 1)  Flexeril 10 Mg Tabs (Cyclobenzaprine Hcl) .Marland Kitchen.. 1 By Mouth  Qhs As Needed For Back Spasm 2)  Flexeril 10 Mg Tabs (Cyclobenzaprine Hcl) .... Take One Half  By Mouth  Qhs As Needed For Back Spasm 3)  Pravachol 40 Mg Tabs (Pravastatin Sodium) .Marland Kitchen.. 1 Tab Daily 4)  Zoloft 25 Mg Tabs (Sertraline Hcl) .Marland Kitchen.. 1 By Mouth Daily Per Mental Health For Pmdd  Allergies (verified): No Known Drug Allergies  Past History:  Past Medical History: Last updated:  03/25/2009 Hx/o LGSIL  Hx/o uterine fibroids Hyperlipidemia hx/o PMDD   Past Surgical History: Last updated: 03/25/2009 Uterine artery embolization 2007 Appendectomy 2010    Review of Systems       Per HPI  Physical Exam  General:  alert and well-developed.  Vital signs noted  Eyes:  EOMI Abdomen:  soft, non-tender, normal bowel sounds, no distention, no masses, no guarding, and no hepatomegaly.   Msk:  No pain with external or internal rotation of hip bilat ankle - normal rom, non tender of achilles, non tender right foot Neurologic:  alert & oriented X3, strength normal in all extremities, sensation intact to light touch, sensation intact to pinprick, gait normal, and DTRs symmetrical and normal.   Skin:  in tact no lesions Psych:  Oriented X3, good eye contact, not anxious appearing, and not depressed appearing.     Impression & Recommendations:  Problem # 1:  ABDOMINAL PAIN, ACUTE (ICD-789.00) Assessment New  unclear cause, normal exam no labs needed no red flags  Orders: [facility]- Est  Level 4 (16109)  Problem # 2:  LEG PAIN (ICD-729.5) Assessment: New  normal exam motor and neurological , pt to keep in mind if any events cause her symptoms  Orders: [facility]- Est  Level 4 (60454)  Complete Medication List: 1)  Flexeril  10 Mg Tabs (Cyclobenzaprine hcl) .Marland Kitchen.. 1 by mouth  qhs as needed for back spasm 2)  Flexeril 10 Mg Tabs (Cyclobenzaprine hcl) .... Take one half  by mouth  qhs as needed for back spasm 3)  Pravachol 40 Mg Tabs (Pravastatin sodium) .Marland Kitchen.. 1 tab daily 4)  Zoloft 25 Mg Tabs (Sertraline hcl) .Marland Kitchen.. 1 by mouth daily per mental health for pmdd  Patient Instructions: 1)  If you have any fever, nausea or vomiting, please return 2)  If you notice any leg weakness or change in your symptoms then return 3)  Otherwise follow-up with Dr. Alvester Morin this month   Orders Added: 1)  [facility]- Est  Level 4 [16109]

## 2010-03-12 ENCOUNTER — Encounter: Payer: Self-pay | Admitting: *Deleted

## 2010-03-20 ENCOUNTER — Other Ambulatory Visit: Payer: Self-pay | Admitting: Obstetrics & Gynecology

## 2010-03-20 ENCOUNTER — Encounter: Payer: Self-pay | Admitting: Obstetrics & Gynecology

## 2010-03-20 DIAGNOSIS — R87619 Unspecified abnormal cytological findings in specimens from cervix uteri: Secondary | ICD-10-CM

## 2010-03-20 LAB — POCT PREGNANCY, URINE: Preg Test, Ur: NEGATIVE

## 2010-04-01 ENCOUNTER — Ambulatory Visit: Payer: Self-pay | Admitting: Family Medicine

## 2010-04-10 ENCOUNTER — Encounter: Payer: Self-pay | Admitting: Family Medicine

## 2010-04-10 ENCOUNTER — Ambulatory Visit (INDEPENDENT_AMBULATORY_CARE_PROVIDER_SITE_OTHER): Payer: Self-pay | Admitting: Family Medicine

## 2010-04-10 VITALS — BP 130/78 | HR 72 | Wt 175.0 lb

## 2010-04-10 DIAGNOSIS — F3281 Premenstrual dysphoric disorder: Secondary | ICD-10-CM

## 2010-04-10 DIAGNOSIS — N943 Premenstrual tension syndrome: Secondary | ICD-10-CM

## 2010-04-10 DIAGNOSIS — L290 Pruritus ani: Secondary | ICD-10-CM

## 2010-04-10 DIAGNOSIS — R220 Localized swelling, mass and lump, head: Secondary | ICD-10-CM

## 2010-04-10 DIAGNOSIS — R221 Localized swelling, mass and lump, neck: Secondary | ICD-10-CM

## 2010-04-10 DIAGNOSIS — E785 Hyperlipidemia, unspecified: Secondary | ICD-10-CM

## 2010-04-10 MED ORDER — LEVONORGEST-ETH ESTRAD 91-DAY 0.15-0.03 &0.01 MG PO TABS: 1.0000 | ORAL_TABLET | Freq: Every day | ORAL | Status: DC

## 2010-04-10 MED ORDER — PRAVASTATIN SODIUM 40 MG PO TABS: 40.0000 mg | ORAL_TABLET | Freq: Every day | ORAL | Status: DC

## 2010-04-10 NOTE — Patient Instructions (Addendum)
Hypertriglyceridemia  Diet for High blood levels of Triglycerides Most fats in food are triglycerides. Triglycerides in your blood are stored as fat in your body. High levels of triglycerides in your blood may put you at a greater risk for heart disease and stroke.  Normal triglyceride levels are less than 150 mg/dL. Borderline high levels are 150-199 mg/dl. High levels are 200 - 499 mg/dL, and very high triglyceride levels are greater than 500 mg/dL. The decision to treat high triglycerides is generally based on the level. For people with borderline or high triglyceride levels, treatment includes weight loss and exercise. Drugs are recommended for people with very high triglyceride levels. Many people who need treatment for high triglyceride levels have metabolic syndrome. This syndrome is a collection of disorders that often include: insulin resistance, high blood pressure, blood clotting problems, high cholesterol and triglycerides. TESTING PROCEDURE FOR TRIGLYCERIDES  You should not eat 4 hours before getting your triglycerides measured. The normal range of triglycerides is between 10 and 250 milligrams per deciliter (mg/dl). Some people may have extreme levels (1000 or above), but your triglyceride level may be too high if it is above 150 mg/dl, depending on what other risk factors you have for heart disease.   People with high blood triglycerides may also have high blood cholesterol levels. If you have high blood cholesterol as well as high blood triglycerides, your risk for heart disease is probably greater than if you only had high triglycerides. High blood cholesterol is one of the main risk factors for heart disease.  CHANGING YOUR DIET  Your weight can affect your blood triglyceride level. If you are more than 20% above your ideal body weight, you may be able to lower your blood triglycerides by losing weight. Eating less and exercising regularly is the best way to combat this. Fat provides  more calories than any other food. The best way to lose weight is to eat less fat. Only 30% of your total calories should come from fat. Less than 7% of your diet should come from saturated fat. A diet low in fat and saturated fat is the same as a diet to decrease blood cholesterol. By eating a diet lower in fat, you may lose weight, lower your blood cholesterol, and lower your blood triglyceride level.  Eating a diet low in fat, especially saturated fat, may also help you lower your blood triglyceride level. Ask your dietitian to help you figure how much fat you can eat based on the number of calories your caregiver has prescribed for you.  Exercise, in addition to helping with weight loss may also help lower triglyceride levels.   Alcohol can increase blood triglycerides. You may need to stop drinking alcoholic beverages.   Too much carbohydrate in your diet may also increase your blood triglycerides. Some complex carbohydrates are necessary in your diet. These may include bread, rice, potatoes, other starchy vegetables and cereals.   Reduce "simple" carbohydrates. These may include pure sugars, candy, honey, and jelly without losing other nutrients. If you have the kind of high blood triglycerides that is affected by the amount of carbohydrates in your diet, you will need to eat less sugar and less high-sugar foods. Your caregiver can help you with this.   Adding 2-4 grams of fish oil (EPA+ DHA) may also help lower triglycerides. Speak with your caregiver before adding any supplements to your regimen.  Following the Diet  Maintain your ideal weight. Your caregivers can help you with a diet. Generally,   eating less food and getting more exercise will help you lose weight. Joining a weight control group may also help. Ask your caregivers for a good weight control group in your area.  Eat low-fat foods instead of high-fat foods. This can help you lose weight too.  These foods are lower in fat. Eat MORE  of these:   Dried beans, peas, and lentils.   Egg whites.   Low-fat cottage cheese.   Fish.   Lean cuts of meat, such as round, sirloin, rump, and flank (cut extra fat off meat you fix).   Whole grain breads, cereals and pasta.   Skim and nonfat dry milk.   Low-fat yogurt.   Poultry without the skin.   Cheese made with skim or part-skim milk, such as mozzarella, parmesan, farmers', ricotta, or pot cheese.   These are higher fat foods. Eat LESS of these:   Whole milk and foods made from whole milk, such as American, blue, cheddar, monterey jack, and swiss cheese   High-fat meats, such as luncheon meats, sausages, knockwurst, bratwurst, hot dogs, ribs, corned beef, ground pork, and regular ground beef.   Fried foods.  Limit saturated fats in your diet. Substituting unsaturated fat for saturated fat may decrease your blood triglyceride level. You will need to read package labels to know which products contain saturated fats.  These foods are high in saturated fat. Eat LESS of these:   Fried pork skins.  Whole milk.   Skin and fat from poultry.   Palm oil.   Butter.   Shortening.   Cream cheese.   Berniece Salines.   Margarines and baked goods made from listed oils.   Vegetable shortenings.   Chitterlings.  Fat from meats.   Coconut oil.   Palm kernel oil.   Lard.   Cream.   Sour cream.   Fatback.   Coffee whiteners and non-dairy creamers made with these oils.   Cheese made from whole milk.   Use unsaturated fats (both polyunsaturated and monounsaturated) moderately. Remember, even though unsaturated fats are better than saturated fats; you still want a diet low in total fat.  These foods are high in unsaturated fat:   Canola oil.  Sunflower oil.   Mayonnaise.   Almonds.   Peanuts.   Pine nuts.   Margarines made with these oils.   Safflower oil.  Olive oil.   Avocados.   Cashews.   Peanut butter.   Sunflower seeds.   Soybean  oil.  Peanut oil.   Olives.   Pecans.   Walnuts.   Pumpkin seeds.   Avoid sugar and other high-sugar foods. This will decrease carbohydrates without decreasing other nutrients. Sugar in your food goes rapidly to your blood. When there is excess sugar in your blood, your liver may use it to make more triglycerides. Sugar also contains calories without other important nutrients.  Eat LESS of these:   Sugar, brown sugar, powdered sugar, jam, jelly, preserves, honey, syrup, molasses, pies, candy, cakes, cookies, frosting, pastries, colas, soft drinks, punches, fruit drinks, and regular gelatin.   Avoid alcohol. Alcohol, even more than sugar, may increase blood triglycerides. In addition, alcohol is high in calories and low in nutrients. Ask for sparkling water, or a diet soft drink instead of an alcoholic beverage.  Suggestions for planning and preparing meals   Bake, broil, grill or roast meats instead of frying.   Remove fat from meats and skin from poultry before cooking.   Add spices, herbs, lemon juice  or vinegar to vegetables instead of salt, rich sauces or gravies.   Use a non-stick skillet without fat or use no-stick sprays.   Cool and refrigerate stews and broth. Then remove the hardened fat floating on the surface before serving.   Refrigerate meat drippings and skim off fat to make low-fat gravies.   Serve more fish.   Use less butter, margarine and other high-fat spreads on bread or vegetables.   Use skim or reconstituted non-fat dry milk for cooking.   Cook with low-fat cheeses.   Substitute low-fat yogurt or cottage cheese for all or part of the sour cream in recipes for sauces, dips or congealed salads.   Use half yogurt/half mayonnaise in salad recipes.   Substitute evaporated skim milk for cream. Evaporated skim milk or reconstituted non-fat dry milk can be whipped and substituted for whipped cream in certain recipes.   Choose fresh fruits for dessert instead  of high-fat foods such as pies or cakes. Fruits are naturally low in fat.  When Dining Out   Order low-fat appetizers such as fruit or vegetable juice, pasta with vegetables or tomato sauce.   Select clear, rather than cream soups.   Ask that dressings and gravies be served on the side. Then use less of them.   Order foods that are baked, broiled, poached, steamed, stir-fried, or roasted.   Ask for margarine instead of butter, and use only a small amount.   Drink sparkling water, unsweetened tea or coffee, or diet soft drinks instead of alcohol or other sweet beverages.  QUESTIONS AND ANSWERS ABOUT OTHER FATS IN THE BLOOD:  SATURATED FAT, TRANS FAT, AND CHOLESTEROL What is trans fat? Trans fat is a type of fat that is formed when vegetable oil is hardened through a process called hydrogenation. This process helps makes foods more solid, gives them shape, and prolongs their shelf life. Trans fats are also called hydrogenated or partially hydrogenated oils.  What do saturated fat, trans fat, and cholesterol in foods have to do with heart disease? Saturated fat, trans fat, and cholesterol in the diet all raise the level of LDL "bad" cholesterol in the blood. The higher the LDL cholesterol, the greater the risk for coronary heart disease (CHD). Saturated fat and trans fat raise LDL similarly.  What foods contain saturated fat, trans fat, and cholesterol? High amounts of saturated fat are found in animal products, such as fatty cuts of meat, chicken skin, and full-fat dairy products like butter, whole milk, cream, and cheese, and in tropical vegetable oils such as palm, palm kernel, and coconut oil. Trans fat is found in some of the same foods as saturated fat, such as vegetable shortening, some margarines (especially hard or stick margarine), crackers, cookies, baked goods, fried foods, salad dressings, and other processed foods made with partially hydrogenated vegetable oils. Small amounts of trans  fat also occur naturally in some animal products, such as milk products, beef, and lamb. Foods high in cholesterol include liver, other organ meats, egg yolks, shrimp, and full-fat dairy products. How can I use the new food label to make heart-healthy food choices? Check the Nutrition Facts panel of the food label. Choose foods lower in saturated fat, trans fat, and cholesterol. For saturated fat and cholesterol, you can also use the Percent Daily Value (%DV): 5% DV or less is low, and 20% DV or more is high. (There is no %DV for trans fat.) Use the Nutrition Facts panel to choose foods low in saturated fat   and cholesterol, and if the trans fat is not listed, read the ingredients and limit products that list shortening or hydrogenated or partially hydrogenated vegetable oil, which tend to be high in trans fat. POINTS TO REMEMBER: YOU NEED A LITTLE TLC (THERAPEUTIC LIFESTYLE CHANGES)  Discuss your risk for heart disease with your caregivers, and take steps to reduce risk factors.   Change your diet. Choose foods that are low in saturated fat, trans fat, and cholesterol.   Add exercise to your daily routine if it is not already being done. Participate in physical activity of moderate intensity, like brisk walking, for at least 30 minutes on most, and preferably all days of the week. No time? Break the 30 minutes into three, 10-minute segments during the day.   Stop smoking. If you do smoke, contact your caregiver to discuss ways in which they can help you quit.   Do not use street drugs.   Maintain a normal weight.   Maintain a healthy blood pressure.   Keep up with your blood work for checking the fats in your blood as directed by your caregiver.  Document Released: 11/06/2003 Document Re-Released: 07/08/2009 [facility] Patient Information 2011 [place], [place]. Stop the zoloft Start on the seasonique Come back to see me in 1 month to tell me how your mood is doing God Bless, Doree Albee MD

## 2010-04-10 NOTE — Progress Notes (Signed)
  Subjective:    Patient ID: Diane Sexton, female    DOB: 05/01/1970, 40 y.o.   MRN: [id number]  HPI Anal Itching: Has recurred. Similar to previous episode. Completed whole course of medication previously for pinworms with resolution in anal itching. No rectal bleeding, hx/o hemorrhoids. No recent sexual activity per pt.   Pap Smear: Per pt, had follow up with [facility] for follow up colposcopy 03/2010, Ob-Gyn stated that repeat colpo was not indicated given low grade findings x2 on colpo per pt.   HLD: Has not been taking pravachol. Pt is afraid that medication will affect her liver. Has made minimal changes in diet. Still high-normal fat diet.   PMDD: Pt states that she d/c'd zoloft because she didn't like the way the medication made her feel. Pt feels that medication made her feel more depressed. No HI/SI. Pt has nt tried other SSRIs in the past.  Pt states that mood stabilizes when not menstruating. Pt has also not tried prolonged OCPs in the past.   Review of Systems     Objective:   Physical Exam Gen: up in bed, NAD HEENT: NCAT CV: RRR PULM: CTAB ABD: S/NT/ND/+ bowel sounds  EXT: 2+ peripheral pulses.        Assessment & Plan:  Anal Itching: Will give additional course of mebendazole HLD: Stressed importance of statin adherence. Spent approx 15 mins discussing meds with patient. Pt agreeable to continuing statin.  PMDD: Will d/c zoloft. WIll place pt on seasonique to see if menstrual avoidance helps with mood.  Pap Smear: Being followed by GYN. Currently no indication for pap smear. Has follow up appt with [facility] in next 5-6 months. Will defer to faculty OB-Gyn

## 2010-04-12 MED ORDER — MEBENDAZOLE 100 MG PO CHEW: CHEWABLE_TABLET | ORAL | Status: DC

## 2010-04-13 LAB — GLUCOSE, CAPILLARY: Glucose-Capillary: 69 mg/dL — ABNORMAL LOW (ref 70–99)

## 2010-04-17 ENCOUNTER — Encounter: Payer: Self-pay | Admitting: Family Medicine

## 2010-04-19 LAB — POCT PREGNANCY, URINE: Preg Test, Ur: NEGATIVE

## 2010-04-21 ENCOUNTER — Telehealth: Payer: Self-pay | Admitting: Family Medicine

## 2010-04-21 DIAGNOSIS — F3281 Premenstrual dysphoric disorder: Secondary | ICD-10-CM

## 2010-04-21 DIAGNOSIS — Z309 Encounter for contraceptive management, unspecified: Secondary | ICD-10-CM

## 2010-04-21 NOTE — Telephone Encounter (Signed)
Called pt and she reports that the meds that Dr.Newton rx'd cost $145 and she has no insurance. I told the pt that I would let Dr.Newton know. Pt said, that she would make OV with Dr.Newton to change meds. I told the pt about the MAP program, but do not know if they have that medication. Fwd. To Dr.Newton Arlyss Repress

## 2010-04-21 NOTE — Telephone Encounter (Signed)
Pt asking to speak with MD re: rx seasonique?

## 2010-04-24 NOTE — Progress Notes (Signed)
NAMEMarland Kitchen  Diane Sexton, Diane Sexton NO.:  [id number]  MEDICAL RECORD NO.:  [id number]           PATIENT TYPE:  A  LOCATION:  [facility]                   FACILITY:  [facility]  PHYSICIAN:  Allie Bossier, MD        DATE OF BIRTH:  [birth year]  DATE OF SERVICE:  03/20/2010                                 CLINIC NOTE  Diane Sexton is a 40 year old lady who has been seen here since January 2011 for low-grade dysplasia Pap smears.  She had a colposcopy done a year ago which had an ECC that was negative and 2 biopsies that showed low-grade dysplasia.  Of note, she is a nonsmoker.  She had a followup Pap smear in July 2011 that showed not surprisingly low-grade dysplasia and she had her Pap smear in December 2011 that had showed low- grade dysplasia.  I feel that in an immunocompetent nonsmoking patients who has not changed their level of dysplasia on Pap smear that she would not benefit from a repeat colposcopy today.  She is a reliable patient who promises to return in 6 months for followup Pap smear.  I have explained that should her Pap smears become of a higher grade (more abnormal), that I would do a repeat colposcopy.     Allie Bossier, MD    MCD/MEDQ  D:  03/20/2010  T:  03/20/2010  Job:  [id number]

## 2010-04-27 ENCOUNTER — Other Ambulatory Visit: Payer: Self-pay

## 2010-04-27 MED ORDER — NORGESTIMATE-ETH ESTRADIOL 0.25-35 MG-MCG PO TABS: ORAL_TABLET | ORAL | Status: DC

## 2010-04-27 NOTE — Telephone Encounter (Signed)
Spoke with pt over the phone about medication. Pt cannot afford seasonique. Will rx sprintec as this is on [facility] 4 dollar list. Told pt to skip blank pills q 2 months to mimic effect of seasonique. Pt agreeable to plan.

## 2010-04-27 NOTE — Telephone Encounter (Signed)
Addended by: Doree Albee on: 04/27/2010 02:20 PM   Modules accepted: Orders

## 2010-04-28 ENCOUNTER — Other Ambulatory Visit: Payer: Self-pay

## 2010-05-04 ENCOUNTER — Ambulatory Visit: Payer: Self-pay | Admitting: Family Medicine

## 2010-05-05 ENCOUNTER — Other Ambulatory Visit: Payer: Self-pay

## 2010-05-05 DIAGNOSIS — R7989 Other specified abnormal findings of blood chemistry: Secondary | ICD-10-CM

## 2010-05-05 LAB — T4, FREE: Free T4: 0.85 ng/dL (ref 0.80–1.80)

## 2010-05-05 LAB — T3, FREE: T3, Free: 2.4 pg/mL (ref 2.3–4.2)

## 2010-05-06 ENCOUNTER — Ambulatory Visit (INDEPENDENT_AMBULATORY_CARE_PROVIDER_SITE_OTHER): Payer: Self-pay | Admitting: Family Medicine

## 2010-05-06 ENCOUNTER — Encounter: Payer: Self-pay | Admitting: Family Medicine

## 2010-05-06 VITALS — BP 125/87 | HR 76 | Temp 98.5°F | Ht 65.5 in | Wt 169.0 lb

## 2010-05-06 DIAGNOSIS — R7989 Other specified abnormal findings of blood chemistry: Secondary | ICD-10-CM

## 2010-05-06 NOTE — Patient Instructions (Signed)
Hypertriglyceridemia    Diet for High blood levels of Triglycerides  Most fats in food are triglycerides. Triglycerides in your blood are stored as fat in your body. High levels of triglycerides in your blood may put you at a greater risk for heart disease and stroke.    Normal triglyceride levels are less than 150 mg/dL. Borderline high levels are 150-199 mg/dl. High levels are 200 - 499 mg/dL, and very high triglyceride levels are greater than 500 mg/dL. The decision to treat high triglycerides is generally based on the level. For people with borderline or high triglyceride levels, treatment includes weight loss and exercise. Drugs are recommended for people with very high triglyceride levels.  Many people who need treatment for high triglyceride levels have metabolic syndrome. This syndrome is a collection of disorders that often include: insulin resistance, high blood pressure, blood clotting problems, high cholesterol and triglycerides.  TESTING PROCEDURE FOR TRIGLYCERIDES   You should not eat 4 hours before getting your triglycerides measured. The normal range of triglycerides is between 10 and 250 milligrams per deciliter (mg/dl). Some people may have extreme levels (1000 or above), but your triglyceride level may be too high if it is above 150 mg/dl, depending on what other risk factors you have for heart disease.    People with high blood triglycerides may also have high blood cholesterol levels. If you have high blood cholesterol as well as high blood triglycerides, your risk for heart disease is probably greater than if you only had high triglycerides. High blood cholesterol is one of the main risk factors for heart disease.   CHANGING YOUR DIET     Your weight can affect your blood triglyceride level. If you are more than 20% above your ideal body weight, you may be able to lower your blood triglycerides by losing weight. Eating less and exercising regularly is the best way to combat this. Fat provides more calories than any other food. The best way to lose weight is to eat less fat. Only 30% of your total calories should come from fat. Less than 7% of your diet should come from saturated fat. A diet low in fat and saturated fat is the same as a diet to decrease blood cholesterol. By eating a diet lower in fat, you may lose weight, lower your blood cholesterol, and lower your blood triglyceride level.    Eating a diet low in fat, especially saturated fat, may also help you lower your blood triglyceride level. Ask your dietitian to help you figure how much fat you can eat based on the number of calories your caregiver has prescribed for you.    Exercise, in addition to helping with weight loss may also help lower triglyceride levels.    Alcohol can increase blood triglycerides. You may need to stop drinking alcoholic beverages.    Too much carbohydrate in your diet may also increase your blood triglycerides. Some complex carbohydrates are necessary in your diet. These may include bread, rice, potatoes, other starchy vegetables and cereals.    Reduce "simple" carbohydrates. These may include pure sugars, candy, honey, and jelly without losing other nutrients. If you have the kind of high blood triglycerides that is affected by the amount of carbohydrates in your diet, you will need to eat less sugar and less high-sugar foods. Your caregiver can help you with this.    Adding 2-4 grams of fish oil (EPA+ DHA) may also help lower triglycerides. Speak with your caregiver before adding any supplements to   your regimen.   Following the Diet    Maintain your ideal weight. Your caregivers can help you with a diet. Generally, eating less food and getting more exercise will help you lose weight. Joining a weight control group may also help. Ask your caregivers for a good weight control group in your area.    Eat low-fat foods instead of high-fat foods. This can help you lose weight too.    These foods are lower in fat. Eat MORE of these:    Dried beans, peas, and lentils.      Egg whites.      Low-fat cottage cheese.      Fish.     Lean cuts of meat, such as round, sirloin, rump, and flank (cut extra fat off meat you fix).     Whole grain breads, cereals and pasta.      Skim and nonfat dry milk.      Low-fat yogurt.      Poultry without the skin.      Cheese made with skim or part-skim milk, such as mozzarella, parmesan, farmers', ricotta, or pot cheese.      These are higher fat foods. Eat LESS of these:    Whole milk and foods made from whole milk, such as American, blue, cheddar, monterey jack, and swiss cheese    High-fat meats, such as luncheon meats, sausages, knockwurst, bratwurst, hot dogs, ribs, corned beef, ground pork, and regular ground beef.    Fried foods.   Limit saturated fats in your diet. Substituting unsaturated fat for saturated fat may decrease your blood triglyceride level. You will need to read package labels to know which products contain saturated fats.    These foods are high in saturated fat. Eat LESS of these:    Fried pork skins.    Whole milk.      Skin and fat from poultry.      Palm oil.      Butter.     Shortening.     Cream cheese.      Bacon.     Margarines and baked goods made from listed oils.      Vegetable shortenings.     Chitterlings.    Fat from meats.      Coconut oil.      Palm kernel oil.      Lard.     Cream.     Sour cream.      Fatback.     Coffee whiteners and non-dairy creamers made with these oils.      Cheese made from whole milk.       Use unsaturated fats (both polyunsaturated and monounsaturated) moderately. Remember, even though unsaturated fats are better than saturated fats; you still want a diet low in total fat.    These foods are high in unsaturated fat:    Canola oil.    Sunflower oil.      Mayonnaise.     Almonds.     Peanuts.     Pine nuts.      Margarines made with these oils.     Safflower oil.    Olive oil.      Avocados.     Cashews.     Peanut butter.      Sunflower seeds.     Soybean oil.    Peanut oil.      Olives.     Pecans.     Walnuts.     Pumpkin seeds.        Avoid sugar and other high-sugar foods. This will decrease carbohydrates without decreasing other nutrients. Sugar in your food goes rapidly to your blood. When there is excess sugar in your blood, your liver may use it to make more triglycerides. Sugar also contains calories without other important nutrients.    Eat LESS of these:    Sugar, brown sugar, powdered sugar, jam, jelly, preserves, honey, syrup, molasses, pies, candy, cakes, cookies, frosting, pastries, colas, soft drinks, punches, fruit drinks, and regular gelatin.    Avoid alcohol. Alcohol, even more than sugar, may increase blood triglycerides. In addition, alcohol is high in calories and low in nutrients. Ask for sparkling water, or a diet soft drink instead of an alcoholic beverage.   Suggestions for planning and preparing meals    Bake, broil, grill or roast meats instead of frying.    Remove fat from meats and skin from poultry before cooking.    Add spices, herbs, lemon juice or vinegar to vegetables instead of salt, rich sauces or gravies.    Use a non-stick skillet without fat or use no-stick sprays.    Cool and refrigerate stews and broth. Then remove the hardened fat floating on the surface before serving.    Refrigerate meat drippings and skim off fat to make low-fat gravies.    Serve more fish.     Use less butter, margarine and other high-fat spreads on bread or vegetables.    Use skim or reconstituted non-fat dry milk for cooking.    Cook with low-fat cheeses.    Substitute low-fat yogurt or cottage cheese for all or part of the sour cream in recipes for sauces, dips or congealed salads.    Use half yogurt/half mayonnaise in salad recipes.    Substitute evaporated skim milk for cream. Evaporated skim milk or reconstituted non-fat dry milk can be whipped and substituted for whipped cream in certain recipes.    Choose fresh fruits for dessert instead of high-fat foods such as pies or cakes. Fruits are naturally low in fat.   When Dining Out    Order low-fat appetizers such as fruit or vegetable juice, pasta with vegetables or tomato sauce.    Select clear, rather than cream soups.    Ask that dressings and gravies be served on the side. Then use less of them.    Order foods that are baked, broiled, poached, steamed, stir-fried, or roasted.    Ask for margarine instead of butter, and use only a small amount.    Drink sparkling water, unsweetened tea or coffee, or diet soft drinks instead of alcohol or other sweet beverages.   QUESTIONS AND ANSWERS ABOUT OTHER FATS IN THE BLOOD:   SATURATED FAT, TRANS FAT, AND CHOLESTEROL  What is trans fat?  Trans fat is a type of fat that is formed when vegetable oil is hardened through a process called hydrogenation. This process helps makes foods more solid, gives them shape, and prolongs their shelf life. Trans fats are also called hydrogenated or partially hydrogenated oils.    What do saturated fat, trans fat, and cholesterol in foods have to do with heart disease?  Saturated fat, trans fat, and cholesterol in the diet all raise the level of LDL "bad" cholesterol in the blood. The higher the LDL cholesterol, the greater the risk for coronary heart disease (CHD). Saturated fat and trans fat raise LDL similarly.     What foods contain saturated fat, trans fat, and cholesterol?  High amounts of saturated fat   are found in animal products, such as fatty cuts of meat, chicken skin, and full-fat dairy products like butter, whole milk, cream, and cheese, and in tropical vegetable oils such as palm, palm kernel, and coconut oil. Trans fat is found in some of the same foods as saturated fat, such as vegetable shortening, some margarines (especially hard or stick margarine), crackers, cookies, baked goods, fried foods, salad dressings, and other processed foods made with partially hydrogenated vegetable oils. Small amounts of trans fat also occur naturally in some animal products, such as milk products, beef, and lamb. Foods high in cholesterol include liver, other organ meats, egg yolks, shrimp, and full-fat dairy products.  How can I use the new food label to make heart-healthy food choices?  Check the Nutrition Facts panel of the food label. Choose foods lower in saturated fat, trans fat, and cholesterol. For saturated fat and cholesterol, you can also use the Percent Daily Value (%DV): 5% DV or less is low, and 20% DV or more is high. (There is no %DV for trans fat.) Use the Nutrition Facts panel to choose foods low in saturated fat and cholesterol, and if the trans fat is not listed, read the ingredients and limit products that list shortening or hydrogenated or partially hydrogenated vegetable oil, which tend to be high in trans fat.  POINTS TO REMEMBER: YOU NEED A LITTLE TLC (THERAPEUTIC LIFESTYLE CHANGES)   Discuss your risk for heart disease with your caregivers, and take steps to reduce risk factors.    Change your diet. Choose foods that are low in saturated fat, trans fat, and cholesterol.     Add exercise to your daily routine if it is not already being done. Participate in physical activity of moderate intensity, like brisk walking, for at least 30 minutes on most, and preferably all days of the week. No time? Break the 30 minutes into three, 10-minute segments during the day.    Stop smoking. If you do smoke, contact your caregiver to discuss ways in which they can help you quit.    Do not use street drugs.    Maintain a normal weight.    Maintain a healthy blood pressure.    Keep up with your blood work for checking the fats in your blood as directed by your caregiver.   Document Released: 11/06/2003 Document Re-Released: 07/08/2009  [employer] Patient Information 2011 [employer].

## 2010-05-06 NOTE — Progress Notes (Signed)
  Subjective:    Patient ID: Diane Sexton, female    DOB: 02/18/1970, 40 y.o.   MRN: [id number]  HPI Dental Pain- Pt with intermittent post mouth dental pain. This has been recurrent over the last year. Feels that she may need her wisdom teeth taken out. No fevers, acute worsening in tooth pain, drainage. Is desiring dental referral.   Mood/PMDD- Recently started sprintec approx 2-3 weeks. On cycle of skipping blanks x 2 sets to mimic menstrual cycle of seasonique. Most recent LMP 1-2 weeks ago per pt. Mood overall stable.   HLD- Most recent LDL @ 190. Still eating high fat diet. Was not taking pravachol because " someone said it woruld hurt my liver".   Review of Systems See HPI     Objective:   Physical Exam Gen: up in bed, NAD  HEENT: NCAT, no intra-oral lesions  CV: RRR  PULM: CTAB  ABD: S/NT/ND/+ bowel sounds  EXT: 2+ peripheral pulses.     Assessment & Plan:  Dental Referral- Discussed with pt that given insurance situation, only indication for dental referral would be acute pain and infection. Pt denies both. Pt is willing to wait for referral.  PMDD- WIll continue with sprintec HLD- Reinforced importance of statin. Approx 20 mins spent of HLD education. Pt agreeable to continuing on statin.  Elevated TSH- Free T3 and T4 checked. Will follow up pending results. Currently asymptomatic.

## 2010-05-15 ENCOUNTER — Telehealth: Payer: Self-pay | Admitting: Family Medicine

## 2010-05-15 NOTE — Telephone Encounter (Signed)
Pt was suppose to have an appt scheduled with the [facility] and haven't heard anything yet.  Please call pt to let her know details on scheduling for clinic and to have appt set up.

## 2010-05-18 ENCOUNTER — Emergency Department (HOSPITAL_BASED_OUTPATIENT_CLINIC_OR_DEPARTMENT_OTHER): Payer: Self-pay

## 2010-05-18 ENCOUNTER — Emergency Department (HOSPITAL_BASED_OUTPATIENT_CLINIC_OR_DEPARTMENT_OTHER)
Admission: EM | Admit: 2010-05-18 | Discharge: 2010-05-18 | Disposition: A | Discharge status: Home / Self Care | Payer: Self-pay | Attending: Emergency Medicine | Admitting: Emergency Medicine

## 2010-05-18 DIAGNOSIS — M79609 Pain in unspecified limb: Secondary | ICD-10-CM | POA: Insufficient documentation

## 2010-05-18 DIAGNOSIS — M25569 Pain in unspecified knee: Secondary | ICD-10-CM | POA: Insufficient documentation

## 2010-05-19 ENCOUNTER — Ambulatory Visit (HOSPITAL_BASED_OUTPATIENT_CLINIC_OR_DEPARTMENT_OTHER)
Admission: RE | Admit: 2010-05-19 | Discharge: 2010-05-19 | Disposition: A | Discharge status: Home / Self Care | Payer: Self-pay | Source: Ambulatory Visit | Attending: Emergency Medicine | Admitting: Emergency Medicine

## 2010-05-19 DIAGNOSIS — M79609 Pain in unspecified limb: Secondary | ICD-10-CM | POA: Insufficient documentation

## 2010-05-22 ENCOUNTER — Telehealth: Payer: Self-pay | Admitting: Family Medicine

## 2010-05-22 ENCOUNTER — Ambulatory Visit: Payer: Self-pay | Admitting: Family Medicine

## 2010-05-22 DIAGNOSIS — R7989 Other specified abnormal findings of blood chemistry: Secondary | ICD-10-CM | POA: Insufficient documentation

## 2010-05-22 NOTE — Telephone Encounter (Signed)
Called and left message for pt. Reiterated that dental referral could not be made unless pt with acute dental pain or infection. Told pt that switching PCP was perfectly acceptable if she desired. Told pt to call with any other questions.

## 2010-05-22 NOTE — Telephone Encounter (Signed)
Message copied by Doree Albee on Fri May 22, 2010 10:00 AM ------      Message from: Charm Rings      Created: Fri May 22, 2010  8:47 AM      Regarding: FW: Requesting PCP change      Contact: [phone]       Could you please call her to discuss this?  Thanks!      ----- Message -----         From: Knox Royalty         Sent: 05/21/2010  10:09 AM           To: Adalberto Cole, RN      Subject: Requesting PCP change                                    Patient is asking to be re-assigned b/c her doctor does not return  phone calls. Is currently assigned to Dr. Alvester Morin.

## 2010-06-11 ENCOUNTER — Ambulatory Visit (INDEPENDENT_AMBULATORY_CARE_PROVIDER_SITE_OTHER): Payer: Self-pay | Admitting: Family Medicine

## 2010-06-11 ENCOUNTER — Encounter: Payer: Self-pay | Admitting: Family Medicine

## 2010-06-11 VITALS — BP 122/86 | HR 118 | Temp 98.1°F | Wt 174.0 lb

## 2010-06-11 DIAGNOSIS — N943 Premenstrual tension syndrome: Secondary | ICD-10-CM

## 2010-06-11 DIAGNOSIS — E785 Hyperlipidemia, unspecified: Secondary | ICD-10-CM

## 2010-06-11 DIAGNOSIS — H6120 Impacted cerumen, unspecified ear: Secondary | ICD-10-CM

## 2010-06-11 DIAGNOSIS — Z202 Contact with and (suspected) exposure to infections with a predominantly sexual mode of transmission: Secondary | ICD-10-CM

## 2010-06-11 NOTE — Assessment & Plan Note (Signed)
Still having mood swings, does not think that Sprintec or zoloft are helping.  Is interested in trying a different mood stabilizer. No SI/HI. Will make f/u appt to discuss this.

## 2010-06-11 NOTE — Progress Notes (Signed)
SUBJECTIVE: Pt comes in today because she is changing PCP's, mostly because she would prefer a female provider for "female" issues.  Biggest concern today is a weird sensation in her left ear sometimes.  Feels like her ear is "swimming" at times.  Usually occurs when she is sitting, not after quick head movements or changing positions.  No pain, no hearing loss, no ear drainage.  Was noted to have large amounts of cerumen in left ear canal at last office visit; curettage and flushing were attempted, but unsuccessful.  Pt has been trying to clean her ear out at home, but feels like she is only pushing cerumen in deeper.   Pt still having mood swings from PMDD, does not feel OCPs or SSRI are helping.  In agreement to set up another appt to discuss this.   Pt would also like to have HIV testing done.  States she gets it every 2 years "just to check" since she was promiscuous in her 70's.  She currently only has one partner and is not worried about re-exposure.  Explained to pt that if she has been exposed to HIV >15 yrs ago, we likely would have a positive test at this point.  However, pt very adamant about having testing done and since this is our first visit together, I told pt that we can do it, but we will discuss if she needs to continue being tested regularly at a future appt.   OBJECTIVE: Physical Exam  Constitutional: She is well-developed, well-nourished, and in no distress. No distress.  HENT:  Head: Normocephalic and atraumatic.  Right Ear: Tympanic membrane, external ear and ear canal normal.  Left Ear: Tympanic membrane, external ear and ear canal normal. No lacerations. No drainage. No foreign bodies.  Eyes: Pupils are equal, round, and reactive to light. Right eye exhibits no discharge.  Neck: Normal range of motion.  Cardiovascular: Normal rate, regular rhythm and normal heart sounds.   No murmur heard. Pulmonary/Chest: Effort normal and breath sounds normal. No respiratory distress.  She has no wheezes.  Lymphadenopathy:    She has no cervical adenopathy.   ASSESSMENT/PLAN: 40 yo F p/w abnormal feeling in her left ear, found to have cerumen impaction with total obstruction of TM and canal s/p irrigation w/ resolution of cerumen impaction and normal TM on repeat exam.  -suggested cerumolytic drops PRN ear fullness in the future -will f/u about PMDD and mood swings -will recheck FLP and make decision about statin at future visit -will check HIV per pt request

## 2010-06-11 NOTE — Assessment & Plan Note (Signed)
Last time pt was here, Dr. Alvester Morin and nursing tried to flush out ear but had very little success.  Has been having odd sensations at times, likely from otoliths, no dizziness or headache, no ear drainage, just annoying.  Ear flushed by Asha and cerumen extracted. L TM is normal, intact light reflex, no signs of trauma to canal or TM.  Suggested cerumolytic drops PRN clogged ears in the future.

## 2010-06-11 NOTE — Assessment & Plan Note (Signed)
Pt has not started taking statin.  Has been 13 months since last FLP.  Will recheck; pt states she has been trying diet and exercise.  If FLP is not improved, pt is agreeable to starting statin.  If it has improved, we will continue w/ diet and exercise, and start OTC fish oil before progressing to statin.

## 2010-06-11 NOTE — Patient Instructions (Signed)
It was great to meet you! Make an appt up front to come back and talk to me about the mood swings/PMDD so we have a whole visit to discuss it. We flushed your ear today.  I would suggest going to [facility] or [employer] or another drug store and getting some over the counter ear drops, called cerumenolytics.  Some examples of these drops are Auro, Cerumol or  CleanEars but  I am sure that there are much cheaper generic brands that work just as well.  Try not to put anything in your ears, to help prevent pushing the wax in deeper!

## 2010-06-12 ENCOUNTER — Ambulatory Visit: Payer: Self-pay | Admitting: Family Medicine

## 2010-06-16 ENCOUNTER — Other Ambulatory Visit: Payer: Self-pay

## 2010-06-16 NOTE — Group Therapy Note (Signed)
NAME:  ALEXSYS, ESKIN NO.:  [id number]   MEDICAL RECORD NO.:  [id number]          PATIENT TYPE:  WOC   LOCATION:  [facility]                   FACILITY:  [facility]   PHYSICIAN:  Elsie Lincoln, MD      DATE OF BIRTH:  Dec 15, 1970   DATE OF SERVICE:                                  CLINIC NOTE   The patient is a 40 year old nulliparous female who was sent to me by  Noland Fordyce at [facility].  The patient was known to have a  torsed fibroid that was diagnosed on laparoscopy.  The patient's fibroid  was untorsed.  The patient was going to have a myomectomy done by Dr.  Ernestina Penna, but she postponed it and then lost her insurance, so Dr.  Ernestina Penna sent the patient to [facility] at [place] back to the  practice.  We discussed the patient's general status pain when she did  not have this large fibroid, in general she does not have a large amount  of pelvic pain.  She has had uterine artery embolization in the past but  would not be apprised as she did not have children.  She talked by  hysterectomy versus myomectomy and she would like to proceed with  myomectomy of the largest fibroid.  The two of the small fibroids do not  bother her and we will leave those in situ.  The patient understands the  risks of procedure, include not limited to bleeding, infection, and  damage to surrounding abdominal organs.  The patient needs to pop  financial aid package to the [facility] for the sliding  scale, she will do that today.   PAST MEDICAL HISTORY:  High cholesterol.   PAST SURGICAL HISTORY:  Uterine artery embolization and appendectomy.   ALLERGIES:  None.   MEDICATIONS:  None.   SOCIAL HISTORY:  Same sex relationship.  No alcohol, drugs, or tobacco  abuse.  The patient is unemployed.   OBSTETRICAL HISTORY:  Pregnant twice with no lab birth.   GYNECOLOGIC HISTORY:  She has had abnormal Pap smear in the past with  LEEP.  Her last normal Pap smear  was in February with Dr. Algie Coffer.  Last mammogram was in 2010 and normal.   FAMILY HISTORY:  Positive for diabetes in her father, heart attack in  her grandmother, high blood pressure in her mother, grandmother, uncles,  and aunt and her mother's side, blood clots.   In this review, it is positive for blood in her stool.  She is to follow  up with her primary care physician for that.   PHYSICAL EXAMINATION:  GENERAL:  Well nourished, well developed in no  apparent distress.  HEENT:  Normocephalic, atraumatic.  VITAL SIGNS:  Temperature 97.3, pulse 78, blood pressure 121/83, weight  173, height 5 feet 5 inches.  ABDOMEN:  Soft, nontender.  No organomegaly.  No hernia.  GENITALIA:  Tanner V.  Urethra and bladder nontender.  Cervix and uterus  mildly tender on deep palpation.  No masses palpated, but difficult  secondary to habitus.  EXTREMITIES:  No edema.   ASSESSMENT/PLAN:  1. A 40 year old female  with pedunculated fibroid for myomectomy.      Myomectomy to be scheduled.  2. Financial package to be filled out.           ______________________________  Elsie Lincoln, MD     KL/MEDQ  D:  10/02/2008  T:  10/03/2008  Job:  [id number]

## 2010-06-24 ENCOUNTER — Ambulatory Visit: Payer: Self-pay | Admitting: Family Medicine

## 2010-06-26 ENCOUNTER — Emergency Department (INDEPENDENT_AMBULATORY_CARE_PROVIDER_SITE_OTHER): Payer: Self-pay

## 2010-06-26 ENCOUNTER — Emergency Department (HOSPITAL_BASED_OUTPATIENT_CLINIC_OR_DEPARTMENT_OTHER)
Admission: EM | Admit: 2010-06-26 | Discharge: 2010-06-26 | Disposition: A | Discharge status: Home / Self Care | Payer: Self-pay | Attending: Emergency Medicine | Admitting: Emergency Medicine

## 2010-06-26 DIAGNOSIS — R748 Abnormal levels of other serum enzymes: Secondary | ICD-10-CM | POA: Insufficient documentation

## 2010-06-26 DIAGNOSIS — R1011 Right upper quadrant pain: Secondary | ICD-10-CM

## 2010-06-28 LAB — HEPATITIS PANEL, ACUTE
HCV Ab: NEGATIVE
Hep A IgM: NEGATIVE
Hep B C IgM: NEGATIVE
Hepatitis B Surface Ag: NEGATIVE

## 2010-07-01 LAB — COMPREHENSIVE METABOLIC PANEL
AST: 56 U/L — ABNORMAL HIGH (ref 0–37)
Alkaline Phosphatase: 73 U/L (ref 39–117)
CO2: 24 mEq/L (ref 19–32)
Chloride: 102 mEq/L (ref 96–112)
Creatinine, Ser: 0.6 mg/dL (ref 0.4–1.2)
GFR calc Af Amer: >60 mL/min (ref >60)
GFR calc non Af Amer: >60 mL/min (ref >60)
Total Bilirubin: 0.7 mg/dL (ref 0.3–1.2)

## 2010-07-01 LAB — CBC
HCT: 38.8 % (ref 36.0–46.0)
Hemoglobin: 13.5 g/dL (ref 12.0–15.0)
MCH: 26.9 pg (ref 26.0–34.0)
MCV: 77.4 fL — ABNORMAL LOW (ref 78.0–100.0)
RBC: 5.01 MIL/uL (ref 3.87–5.11)
WBC: 13.6 10*3/uL — ABNORMAL HIGH (ref 4.0–10.5)

## 2010-07-01 LAB — URINALYSIS, ROUTINE W REFLEX MICROSCOPIC
Glucose, UA: NEGATIVE mg/dL
Ketones, ur: NEGATIVE mg/dL
Nitrite: NEGATIVE
Urobilinogen, UA: 0.2 mg/dL (ref 0.0–1.0)
pH: 6.5 (ref 5.0–8.0)

## 2010-07-01 LAB — DIFFERENTIAL
Eosinophils Absolute: 0.2 10*3/uL (ref 0.0–0.7)
Eosinophils Relative: 2 % (ref 0–5)
Lymphocytes Relative: 24 % (ref 12–46)
Lymphs Abs: 3.3 10*3/uL (ref 0.7–4.0)
Monocytes Absolute: 0.9 10*3/uL (ref 0.1–1.0)
Monocytes Relative: 7 % (ref 3–12)

## 2010-07-01 LAB — URINE MICROSCOPIC-ADD ON

## 2010-07-12 ENCOUNTER — Emergency Department (INDEPENDENT_AMBULATORY_CARE_PROVIDER_SITE_OTHER): Payer: Self-pay

## 2010-07-12 ENCOUNTER — Emergency Department (HOSPITAL_BASED_OUTPATIENT_CLINIC_OR_DEPARTMENT_OTHER)
Admission: EM | Admit: 2010-07-12 | Discharge: 2010-07-12 | Disposition: A | Discharge status: Home / Self Care | Payer: Self-pay | Attending: Emergency Medicine | Admitting: Emergency Medicine

## 2010-07-12 DIAGNOSIS — M545 Low back pain: Secondary | ICD-10-CM

## 2010-07-12 DIAGNOSIS — D259 Leiomyoma of uterus, unspecified: Secondary | ICD-10-CM

## 2010-07-12 DIAGNOSIS — S20229A Contusion of unspecified back wall of thorax, initial encounter: Secondary | ICD-10-CM | POA: Insufficient documentation

## 2010-07-12 DIAGNOSIS — W07XXXA Fall from chair, initial encounter: Secondary | ICD-10-CM | POA: Insufficient documentation

## 2010-07-22 ENCOUNTER — Emergency Department (HOSPITAL_BASED_OUTPATIENT_CLINIC_OR_DEPARTMENT_OTHER)
Admission: EM | Admit: 2010-07-22 | Discharge: 2010-07-22 | Disposition: A | Discharge status: Home / Self Care | Payer: Self-pay | Attending: Emergency Medicine | Admitting: Emergency Medicine

## 2010-07-22 DIAGNOSIS — M549 Dorsalgia, unspecified: Secondary | ICD-10-CM | POA: Insufficient documentation

## 2010-09-18 DIAGNOSIS — D219 Benign neoplasm of connective and other soft tissue, unspecified: Secondary | ICD-10-CM | POA: Insufficient documentation

## 2010-10-02 ENCOUNTER — Ambulatory Visit (INDEPENDENT_AMBULATORY_CARE_PROVIDER_SITE_OTHER): Payer: Self-pay | Admitting: Obstetrics & Gynecology

## 2010-10-02 ENCOUNTER — Encounter: Payer: Self-pay | Admitting: Obstetrics & Gynecology

## 2010-10-02 VITALS — Ht 65.0 in | Wt 170.9 lb

## 2010-10-02 DIAGNOSIS — IMO0002 Reserved for concepts with insufficient information to code with codable children: Secondary | ICD-10-CM

## 2010-10-02 DIAGNOSIS — R87612 Low grade squamous intraepithelial lesion on cytologic smear of cervix (LGSIL): Secondary | ICD-10-CM

## 2010-10-02 NOTE — Progress Notes (Signed)
  Subjective:    Patient ID: Assunta Found, female    DOB: 11-27-1970, 40 y.o.   MRN: [id number]  HPI  Ms. Slivinski has had LGSIL paps since1/2010. She had a colposcopy 12/10 and has been followed with pap smears every six months.  Her annual exam was last done 02/2010.  Review of Systems Her mammogram is due 2013.  She is currently not sexually active. Objective:   Physical Exam   Her vulva, vagina, and cervix appear normal.     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 Mammogram in the Spring.

## 2010-10-14 ENCOUNTER — Telehealth: Payer: Self-pay | Admitting: *Deleted

## 2010-10-14 NOTE — Telephone Encounter (Signed)
Pt left message requesting Pap results from last week.  I called pt and left message that her Pap result is the same as her previous paps from the last 2 years. She should stick with the plan from Dr. Marice Potter of having a repeat pap in 6 mos. She may call back if she has further questions.

## 2010-10-22 NOTE — Progress Notes (Signed)
Quick Note:  Please schedule for a repeat pap in 6 months. ______

## 2010-11-18 ENCOUNTER — Encounter: Payer: Self-pay | Admitting: Family Medicine

## 2010-11-18 ENCOUNTER — Ambulatory Visit (INDEPENDENT_AMBULATORY_CARE_PROVIDER_SITE_OTHER): Payer: Self-pay | Admitting: Family Medicine

## 2010-11-18 ENCOUNTER — Encounter (HOSPITAL_BASED_OUTPATIENT_CLINIC_OR_DEPARTMENT_OTHER): Payer: Self-pay

## 2010-11-18 ENCOUNTER — Other Ambulatory Visit: Payer: Self-pay

## 2010-11-18 ENCOUNTER — Emergency Department (HOSPITAL_BASED_OUTPATIENT_CLINIC_OR_DEPARTMENT_OTHER)
Admission: EM | Admit: 2010-11-18 | Discharge: 2010-11-18 | Disposition: A | Discharge status: Home / Self Care | Payer: Self-pay | Attending: Emergency Medicine | Admitting: Emergency Medicine

## 2010-11-18 ENCOUNTER — Emergency Department (INDEPENDENT_AMBULATORY_CARE_PROVIDER_SITE_OTHER): Payer: Self-pay

## 2010-11-18 VITALS — BP 122/82 | HR 73 | Ht 65.5 in | Wt 174.0 lb

## 2010-11-18 DIAGNOSIS — M549 Dorsalgia, unspecified: Secondary | ICD-10-CM | POA: Insufficient documentation

## 2010-11-18 DIAGNOSIS — R079 Chest pain, unspecified: Secondary | ICD-10-CM

## 2010-11-18 DIAGNOSIS — R0789 Other chest pain: Secondary | ICD-10-CM

## 2010-11-18 DIAGNOSIS — R7989 Other specified abnormal findings of blood chemistry: Secondary | ICD-10-CM

## 2010-11-18 DIAGNOSIS — R071 Chest pain on breathing: Secondary | ICD-10-CM | POA: Insufficient documentation

## 2010-11-18 HISTORY — DX: Hyperlipidemia, unspecified: E78.5

## 2010-11-18 LAB — CARDIAC PANEL(CRET KIN+CKTOT+MB+TROPI)
CK, MB: 1.7 ng/mL (ref 0.3–4.0)
Total CK: 159 U/L (ref 7–177)

## 2010-11-18 LAB — CBC
MCH: 27.2 pg (ref 26.0–34.0)
MCHC: 34.9 g/dL (ref 30.0–36.0)
Platelets: 329 10*3/uL (ref 150–400)
RBC: 5.03 MIL/uL (ref 3.87–5.11)
RDW: 14.5 % (ref 11.5–15.5)

## 2010-11-18 LAB — BASIC METABOLIC PANEL
Calcium: 9.1 mg/dL (ref 8.4–10.5)
GFR calc Af Amer: >90 mL/min (ref >90)
GFR calc non Af Amer: >90 mL/min (ref >90)
Sodium: 139 mEq/L (ref 135–145)

## 2010-11-18 MED ORDER — NITROGLYCERIN 0.4 MG SL SUBL: 0.4000 mg | SUBLINGUAL_TABLET | SUBLINGUAL | Status: DC | PRN

## 2010-11-18 MED ORDER — IBUPROFEN 800 MG PO TABS
800.0000 mg | ORAL_TABLET | Freq: Once | ORAL | Status: AC
  Administered 2010-11-18: 800 mg via ORAL
  Filled 2010-11-18: qty 1

## 2010-11-18 NOTE — Progress Notes (Addendum)
Diane Sexton presents to clinic today for followup of chest pain. She's had a weeks worth of left chest nonradiating sharp short duration and nonexertional chest pain. They come and go. This morning she became frustrated and worried and went to [facility] emergency room. There cardiac enzymes EKG and chest x-ray were normal. She was asked to followup with her primary care provider for a stress test. In clinic today she feels well.  PMH reviewed.  ROS as above otherwise neg Medications reviewed.  Exam:  BP 122/82  Pulse 73  Ht 5' 5.5" (1.664 m)  Wt 174 lb (78.926 kg)  BMI 28.51 kg/m2  LMP 11/06/2010 Gen: Well NAD HEENT: EOMI,  MMM, Enlarged and smooth thyroid Lungs: CTABL Nl WOB Heart: RRR no MRG Abd: NABS, NT, ND Exts: Non edematous BL  LE, warm and well perfused.

## 2010-11-18 NOTE — ED Notes (Signed)
Pt transported to and from radiology. 

## 2010-11-18 NOTE — ED Notes (Signed)
Pt reported pain at IV site.  No redness or signs of infiltration.  Line d/c'd per pt request.

## 2010-11-18 NOTE — ED Notes (Signed)
Pt reports a 1 week history of intermittent chest pressure.  She also reports low back pain from previous injury, unrelieved by Flexeril.

## 2010-11-18 NOTE — ED Provider Notes (Signed)
History     CSN: [id number] Arrival date & time: 11/18/2010  8:08 AM   First MD Initiated Contact with Patient 11/18/10 [phone]      Chief Complaint  Patient presents with  . Chest Pain  . Back Pain    (Consider location/radiation/quality/duration/timing/severity/associated sxs/prior treatment) Patient is a 40 y.o. female presenting with chest pain. The history is provided by the patient.  Chest Pain The chest pain began more than 2 weeks ago. Duration of episode(s) is 2 weeks. Chest pain occurs constantly. The chest pain is unchanged. At its most intense, the pain is at 3/10. The pain is currently at 3/10. The severity of the pain is mild. The quality of the pain is described as aching, pleuritic and tightness. The pain does not radiate. Chest pain is worsened by deep breathing. Pertinent negatives for primary symptoms include no fever, no syncope, no shortness of breath, no cough, no wheezing, no abdominal pain, no nausea, no vomiting and no altered mental status.  Pertinent negatives for associated symptoms include no numbness and no weakness. She tried nothing for the symptoms.  Her past medical history is significant for hyperlipidemia.  Pertinent negatives for past medical history include no CAD, no diabetes and no hypertension.  Her family medical history is significant for CAD in family.     Past Medical History  Diagnosis Date  . Depression     mood swings  . Hyperlipemia     Past Surgical History  Procedure Date  . Appendectomy 2009  . [place] 2008    Family History  Problem Relation Age of Onset  . Hypertension Mother   . Hypertension Father   . Diabetes Father     History  Substance Use Topics  . Smoking status: Never Smoker   . Smokeless tobacco: Never Used  . Alcohol Use: Yes    OB History    Grav Para Term Preterm Abortions TAB SAB Ect Mult Living   3    3 3     0      Review of Systems  Constitutional: Negative for fever.  Respiratory: Negative for  cough, shortness of breath and wheezing.   Cardiovascular: Positive for chest pain. Negative for syncope.  Gastrointestinal: Negative for nausea, vomiting and abdominal pain.  Musculoskeletal: Positive for back pain.       Has had left lower back pain for several months after a fall that is unchanged but no new upper back pain  Neurological: Negative for weakness and numbness.  Psychiatric/Behavioral: Negative for altered mental status.  All other systems reviewed and are negative.    Allergies  Oxycodone  Home Medications   Current Outpatient Rx  Name Route Sig Dispense Refill  . CYCLOBENZAPRINE HCL 10 MG PO TABS Oral Take 10 mg by mouth at bedtime as needed. for back spasm     . CYCLOBENZAPRINE HCL 10 MG PO TABS Oral Take by mouth at bedtime as needed. 1/2 for back spasm     . LEVONORGEST-ETH ESTRAD 91-DAY 0.15-0.03 &0.01 MG PO TABS Oral Take 1 tablet by mouth daily. 91 each 0  . MEBENDAZOLE 100 MG PO CHEW  1 pill once 1 tablet 0  . NORGESTIMATE-ETH ESTRADIOL 0.25-35 MG-MCG PO TABS  Take as prescribed, skipping blank pills every 2 packs as to avoid menstruation 30 tablet 12  . PRAVASTATIN SODIUM 40 MG PO TABS Oral Take 1 tablet (40 mg total) by mouth daily. 30 tablet 3  . SERTRALINE HCL 25 MG PO TABS Oral  Take 25 mg by mouth daily. per mental health for PMDD       BP 129/85  Pulse 91  Temp(Src) 99.3 F (37.4 C) (Oral)  Resp 16  Ht 5\' 5"  (1.651 m)  Wt 171 lb (77.565 kg)  BMI 28.46 kg/m2  SpO2 99%  LMP 11/06/2010  Physical Exam  Nursing note and vitals reviewed. Constitutional: She is oriented to person, place, and time. She appears well-developed and well-nourished. No distress.  HENT:  Head: Normocephalic and atraumatic.  Eyes: EOM are normal. Pupils are equal, round, and reactive to light.  Neck: Normal range of motion. Neck supple.  Cardiovascular: Normal rate, regular rhythm, normal heart sounds and intact distal pulses.  Exam reveals no friction rub.   No murmur  heard. Pulmonary/Chest: Effort normal and breath sounds normal. She has no wheezes. She has no rales. She exhibits tenderness.    Abdominal: Soft. Bowel sounds are normal. She exhibits no distension. There is no tenderness. There is no rebound and no guarding.  Musculoskeletal: Normal range of motion. She exhibits no tenderness.       Right shoulder: She exhibits tenderness.       Arms:      No edema  Neurological: She is alert and oriented to person, place, and time. No cranial nerve deficit.  Skin: Skin is warm and dry. No rash noted.  Psychiatric: She has a normal mood and affect. Her behavior is normal.    ED Course  Procedures (including critical care time)  Labs Reviewed  CBC - Abnormal; Notable for the following:    WBC 10.9 (*)    All other components within normal limits  BASIC METABOLIC PANEL  CARDIAC PANEL(CRET KIN+CKTOT+MB+TROPI)   No results found.   No diagnosis found.   Date: 11/18/2010  Rate: 86  Rhythm: normal sinus rhythm  QRS Axis: normal  Intervals: normal  ST/T Wave abnormalities: normal  Conduction Disutrbances:none  Narrative Interpretation:   Old EKG Reviewed: unchanged    MDM  Pt with atypical story for CP that has now been constant for 2 weeks.  TIMI 0 but risk factors include family hx and HLD.  PERC neg.  EKG wnl.  Ibuprofen given to see if relieves pain.  Doubt dissection, MI, PE given hx and exam and no infectious sx.  CXR, CBC, BMP, CE (0,23min) pending. Pt has not been on oral hormones for 1 year.  All labs wnl.  EKG and CXR wnl.  Pt feeling better after ibuprofen.  Discussed with pt result and to f/u with PCP for stress test given family hx even though TIMI 0.  Pt also understands to return for worsening sx and f/u.        Gwyneth Sprout, MD 11/18/10 [phone]

## 2010-11-18 NOTE — Assessment & Plan Note (Signed)
Chest pain is certainly atypical possibly even non-anginal.  Calculated pretest probability and found to be less than 20% based on age and risk factors. Therefore an exercise stress EKG is unlikely to be helpful. Plan to refer to cardiology where they can determine if she needs and stress echo or nuclear stress test or just a Holter monitor. We'll followup. Also we'll provide nitroglycerin sublingually for use with continued chest pain.

## 2010-11-18 NOTE — Assessment & Plan Note (Signed)
Enlarged thyroid with history of elevated TSH. We'll order TSH and free T4 and follow

## 2010-11-18 NOTE — ED Notes (Signed)
MD at bedside. 

## 2010-11-18 NOTE — Patient Instructions (Addendum)
Thank you for coming in today. We will call about your cardiology appointment.  I wrote a rx for nitroglycerin. If your pain last over 1-2 minutes. Take a nitroglycerin and place it under your tongue. You can repeat this every 5 minutes for three times in a row.  If you do this twice in a row and have pain call 911.  I think you will not need to use this.  Please start taking a baby aspirin a day.  Come back and see Dr. Fara Boros in 2 weeks.

## 2010-11-19 ENCOUNTER — Encounter: Payer: Self-pay | Admitting: Cardiology

## 2010-11-19 ENCOUNTER — Encounter: Payer: Self-pay | Admitting: *Deleted

## 2010-11-19 ENCOUNTER — Ambulatory Visit (INDEPENDENT_AMBULATORY_CARE_PROVIDER_SITE_OTHER): Payer: Self-pay | Admitting: Cardiology

## 2010-11-19 DIAGNOSIS — Z8249 Family history of ischemic heart disease and other diseases of the circulatory system: Secondary | ICD-10-CM

## 2010-11-19 DIAGNOSIS — E785 Hyperlipidemia, unspecified: Secondary | ICD-10-CM

## 2010-11-19 DIAGNOSIS — Z8679 Personal history of other diseases of the circulatory system: Secondary | ICD-10-CM

## 2010-11-19 DIAGNOSIS — R079 Chest pain, unspecified: Secondary | ICD-10-CM

## 2010-11-19 NOTE — Progress Notes (Signed)
   Diane Sexton Date of Birth: [birth year] Medical Record #[id number]  History of Present Illness: Diane Sexton is a pleasant 40 year old African American female seen at the request of Dr. Denyse Amass for evaluation of chest pain. She reports a two-week history of left pectoral pain. It feels like fingers pinching and sharp. It is constant. It lasts throughout the day and at night. She has not taken anything for relief. She became concerned enough that she went to the emergency department at which point blood work, ECG, and x-ray were unremarkable. She relates that she is under a lot of stress. She does have a history of hyperlipidemia and a family history of coronary disease.  Current Outpatient Prescriptions on File Prior to Visit  Medication Sig Dispense Refill  . cyclobenzaprine (FLEXERIL) 10 MG tablet Take 10 mg by mouth at bedtime as needed. for back spasm       . nitroGLYCERIN (NITROSTAT) 0.4 MG SL tablet Place 1 tablet (0.4 mg total) under the tongue every 5 (five) minutes as needed for chest pain.  90 tablet  12    Allergies  Allergen Reactions  . Oxycodone Other (See Comments)    Upset stomach    Past Medical History  Diagnosis Date  . Depression     mood swings  . Hyperlipemia     Past Surgical History  Procedure Date  . Appendectomy 2009  . [place] 2008    History  Smoking status  . Never Smoker   Smokeless tobacco  . Never Used    History  Alcohol Use  . Yes    Family History  Problem Relation Age of Onset  . Hypertension Mother   . Heart attack Mother 48    light MI  . Hypertension Father   . Diabetes Father     Review of Systems: The review of systems is positive for increased stress related to a high stress job and going to school part-time. She is not sleeping well. She is unable to get much exercise..  All other systems were reviewed and are negative.  Physical Exam: BP 140/91  Pulse 89  Ht 5\' 5"  (1.651 m)  Wt 173 lb 1.9 oz (78.527 kg)  BMI 28.81  kg/m2  LMP 11/06/2010 She is an overweight black female in no acute distress.The patient is alert and oriented x 3.  The mood and affect are normal.  The skin is warm and dry.  Color is normal.  The HEENT exam reveals that the sclera are nonicteric.  The mucous membranes are moist.  The carotids are 2+ without bruits.  There is no thyromegaly.  There is no JVD.  The lungs are clear.  The chest wall is non tender.  The heart exam reveals a regular rate with a normal S1 and S2.  There are no murmurs, gallops, or rubs.  The PMI is not displaced.   Abdominal exam reveals good bowel sounds.  There is no guarding or rebound.  There is no hepatosplenomegaly or tenderness.  There are no masses.  Exam of the legs reveal no clubbing, cyanosis, or edema.  The legs are without rashes.  The distal pulses are intact.  Cranial nerves II - XII are intact.  Motor and sensory functions are intact.  The gait is normal.  LABORATORY DATA: ECG was reviewed and demonstrates normal sinus rhythm with nonspecific T-wave abnormality.  Assessment / Plan:

## 2010-11-19 NOTE — Patient Instructions (Signed)
We will schedule you for a stress echo.   

## 2010-11-19 NOTE — Assessment & Plan Note (Signed)
Her chest pain is somewhat atypical. She does have some mild precordial pain to palpation. She does have risk factors of hyperlipidemia and family history of coronary disease. I suspect that her symptoms are more somatic from stress. Given her concerns we will schedule her for a stress echo evaluation.

## 2010-11-27 ENCOUNTER — Other Ambulatory Visit (HOSPITAL_COMMUNITY): Payer: Self-pay

## 2010-11-27 ENCOUNTER — Other Ambulatory Visit (HOSPITAL_COMMUNITY): Payer: Self-pay | Admitting: Radiology

## 2010-12-08 ENCOUNTER — Ambulatory Visit (HOSPITAL_COMMUNITY): Payer: Self-pay | Attending: Internal Medicine

## 2010-12-08 ENCOUNTER — Other Ambulatory Visit (HOSPITAL_COMMUNITY): Payer: Self-pay | Admitting: Radiology

## 2010-12-08 ENCOUNTER — Ambulatory Visit (HOSPITAL_BASED_OUTPATIENT_CLINIC_OR_DEPARTMENT_OTHER): Payer: Self-pay | Admitting: Radiology

## 2010-12-08 DIAGNOSIS — R5381 Other malaise: Secondary | ICD-10-CM | POA: Insufficient documentation

## 2010-12-08 DIAGNOSIS — R0609 Other forms of dyspnea: Secondary | ICD-10-CM | POA: Insufficient documentation

## 2010-12-08 DIAGNOSIS — R0989 Other specified symptoms and signs involving the circulatory and respiratory systems: Secondary | ICD-10-CM | POA: Insufficient documentation

## 2010-12-08 DIAGNOSIS — E785 Hyperlipidemia, unspecified: Secondary | ICD-10-CM | POA: Insufficient documentation

## 2010-12-08 DIAGNOSIS — R079 Chest pain, unspecified: Secondary | ICD-10-CM

## 2010-12-08 DIAGNOSIS — R5383 Other fatigue: Secondary | ICD-10-CM | POA: Insufficient documentation

## 2010-12-08 DIAGNOSIS — R072 Precordial pain: Secondary | ICD-10-CM | POA: Insufficient documentation

## 2010-12-08 MED ORDER — PERFLUTREN LIPID MICROSPHERE 6.52 MG/ML IV SUSP
10.0000 uL/kg | Freq: Once | INTRAVENOUS | Status: AC
  Administered 2010-12-08: 08:00:00 via INTRAVENOUS

## 2010-12-08 MED ORDER — PERFLUTREN LIPID MICROSPHERE 6.52 MG/ML IV SUSP: 10.0000 uL/kg | Freq: Once | INTRAVENOUS | Status: DC

## 2010-12-09 ENCOUNTER — Telehealth: Payer: Self-pay | Admitting: *Deleted

## 2010-12-09 NOTE — Telephone Encounter (Signed)
Lm w/Echo results. Advised to call back if has questions.

## 2010-12-29 ENCOUNTER — Encounter: Payer: Self-pay | Admitting: Family Medicine

## 2011-01-05 ENCOUNTER — Other Ambulatory Visit: Payer: Self-pay | Admitting: Family Medicine

## 2011-01-05 DIAGNOSIS — Z1231 Encounter for screening mammogram for malignant neoplasm of breast: Secondary | ICD-10-CM

## 2011-01-08 ENCOUNTER — Encounter: Payer: Self-pay | Admitting: Family Medicine

## 2011-01-28 ENCOUNTER — Emergency Department (HOSPITAL_BASED_OUTPATIENT_CLINIC_OR_DEPARTMENT_OTHER)
Admission: EM | Admit: 2011-01-28 | Discharge: 2011-01-28 | Disposition: A | Discharge status: Home / Self Care | Payer: Self-pay | Attending: Emergency Medicine | Admitting: Emergency Medicine

## 2011-01-28 ENCOUNTER — Encounter (HOSPITAL_BASED_OUTPATIENT_CLINIC_OR_DEPARTMENT_OTHER): Payer: Self-pay | Admitting: *Deleted

## 2011-01-28 ENCOUNTER — Other Ambulatory Visit: Payer: Self-pay

## 2011-01-28 DIAGNOSIS — R0789 Other chest pain: Secondary | ICD-10-CM | POA: Insufficient documentation

## 2011-01-28 LAB — BASIC METABOLIC PANEL
CO2: 24 mEq/L (ref 19–32)
Chloride: 103 mEq/L (ref 96–112)
Creatinine, Ser: 0.7 mg/dL (ref 0.50–1.10)

## 2011-01-28 LAB — CBC
HCT: 37.2 % (ref 36.0–46.0)
MCV: 78 fL (ref 78.0–100.0)
Platelets: 311 10*3/uL (ref 150–400)
RBC: 4.77 MIL/uL (ref 3.87–5.11)
WBC: 13.6 10*3/uL — ABNORMAL HIGH (ref 4.0–10.5)

## 2011-01-28 LAB — CARDIAC PANEL(CRET KIN+CKTOT+MB+TROPI)
Relative Index: 1.2 (ref 0.0–2.5)
Total CK: 136 U/L (ref 7–177)

## 2011-01-28 NOTE — ED Notes (Signed)
Pt states she is suffering from depression that she was previously treated for by a psychiatrist. States she had counseling sessions and tried zoloft for a couple months "but it made me feel worse". States she has frequent feelings of hopelessness, but denies having ever had any specific plan of self harm. Pt states "i just feel like i dont have anybody there to listen to me when i go through these moods, like nobody will ever understand. And the scary part is that i dont know why i feel this way. I just feel like i have such a dark mood, and i wonder if im ever going to get to a place where im happy again.". Pt denies SI/HI at this time. States intent to work towards emotional health and to f/u with her PMD regarding depression and future treatment.

## 2011-01-28 NOTE — ED Provider Notes (Signed)
History     CSN: [id number]  Arrival date & time 01/28/11  0421   First MD Initiated Contact with Patient 01/28/11 0440      Chief Complaint  Patient presents with  . Chest Pain    (Consider location/radiation/quality/duration/timing/severity/associated sxs/prior treatment) HPI Comments: Pt with 3 month hx of frequent episodes of sharp pain under left breast.  Has episodes every day, all day  Patient is a 40 y.o. female presenting with chest pain. The history is provided by the patient.  Chest Pain Episode onset: 3 months. Chest pain occurs frequently. The chest pain is unchanged. Associated with: not assoicated with breathing, movement, exertion. The severity of the pain is moderate. The quality of the pain is described as brief and sharp. The pain does not radiate. Pertinent negatives for primary symptoms include no fever, no fatigue, no shortness of breath, no cough, no abdominal pain, no nausea, no vomiting and no dizziness.  Pertinent negatives for associated symptoms include no diaphoresis, no numbness and no weakness.     Past Medical History  Diagnosis Date  . Depression     mood swings  . Hyperlipemia     Past Surgical History  Procedure Date  . Appendectomy 2009  . [place] 2008    Family History  Problem Relation Age of Onset  . Hypertension Mother   . Heart attack Mother 48    light MI  . Hypertension Father   . Diabetes Father     History  Substance Use Topics  . Smoking status: Never Smoker   . Smokeless tobacco: Never Used  . Alcohol Use: Yes    OB History    Grav Para Term Preterm Abortions TAB SAB Ect Mult Living   3    3 3     0      Review of Systems  Constitutional: Negative for fever, chills, diaphoresis and fatigue.  HENT: Negative for congestion, rhinorrhea and sneezing.   Eyes: Negative.   Respiratory: Negative for cough, chest tightness and shortness of breath.   Cardiovascular: Positive for chest pain. Negative for leg swelling.    Gastrointestinal: Negative for nausea, vomiting, abdominal pain, diarrhea and blood in stool.  Genitourinary: Negative for frequency, hematuria, flank pain and difficulty urinating.  Musculoskeletal: Negative for back pain and arthralgias.  Skin: Negative for rash.  Neurological: Negative for dizziness, speech difficulty, weakness, numbness and headaches.    Allergies  Oxycodone  Home Medications   Current Outpatient Rx  Name Route Sig Dispense Refill  . CYCLOBENZAPRINE HCL 10 MG PO TABS Oral Take 10 mg by mouth at bedtime as needed. for back spasm     . NITROGLYCERIN 0.4 MG SL SUBL Sublingual Place 1 tablet (0.4 mg total) under the tongue every 5 (five) minutes as needed for chest pain. 90 tablet 12    BP 131/85  Pulse 78  Temp(Src) 98.5 F (36.9 C) (Oral)  Resp 18  Ht 5\' 5"  (1.651 m)  Wt 174 lb (78.926 kg)  BMI 28.96 kg/m2  SpO2 99%  LMP 01/16/2011  Physical Exam  Constitutional: She is oriented to person, place, and time. She appears well-developed and well-nourished.  HENT:  Head: Normocephalic and atraumatic.  Eyes: Pupils are equal, round, and reactive to light.  Neck: Normal range of motion. Neck supple.  Cardiovascular: Normal rate, regular rhythm and normal heart sounds.   Pulmonary/Chest: Effort normal and breath sounds normal. No respiratory distress. She has no wheezes. She has no rales. She exhibits tenderness.  Some reproducible pain to left mid chest  Abdominal: Soft. Bowel sounds are normal. There is no tenderness. There is no rebound and no guarding.  Musculoskeletal: Normal range of motion. She exhibits no edema.  Lymphadenopathy:    She has no cervical adenopathy.  Neurological: She is alert and oriented to person, place, and time.  Skin: Skin is warm and dry. No rash noted.  Psychiatric: She has a normal mood and affect.    ED Course  Procedures (including critical care time) Results for orders placed during the hospital encounter of  01/28/11  CBC      Component Value Range   WBC 13.6 (*) 4.0 - 10.5 (K/uL)   RBC 4.77  3.87 - 5.11 (MIL/uL)   Hemoglobin 12.9  12.0 - 15.0 (g/dL)   HCT 16.1  09.6 - 04.5 (%)   MCV 78.0  78.0 - 100.0 (fL)   MCH 27.0  26.0 - 34.0 (pg)   MCHC 34.7  30.0 - 36.0 (g/dL)   RDW 40.9  81.1 - 91.4 (%)   Platelets 311  150 - 400 (K/uL)  BASIC METABOLIC PANEL      Component Value Range   Sodium 137  135 - 145 (mEq/L)   Potassium 3.3 (*) 3.5 - 5.1 (mEq/L)   Chloride 103  96 - 112 (mEq/L)   CO2 24  19 - 32 (mEq/L)   Glucose, Bld 90  70 - 99 (mg/dL)   BUN 10  6 - 23 (mg/dL)   Creatinine, Ser 7.82  0.50 - 1.10 (mg/dL)   Calcium 8.9  8.4 - 95.6 (mg/dL)   GFR calc non Af Amer >90  >90 (mL/min)   GFR calc Af Amer >90  >90 (mL/min)  CARDIAC PANEL(CRET KIN+CKTOT+MB+TROPI)      Component Value Range   Total CK 136  7 - 177 (U/L)   CK, MB 1.6  0.3 - 4.0 (ng/mL)   Troponin I <0.30  <0.30 (ng/mL)   Relative Index 1.2  0.0 - 2.5    No results found.     Date: 01/28/2011  Rate: 77  Rhythm: normal sinus rhythm  QRS Axis: normal  Intervals: normal  ST/T Wave abnormalities: nonspecific T wave changes  Conduction Disutrbances:none  Narrative Interpretation:   Old EKG Reviewed: unchanged   1. Chest pain, atypical       MDM  Pt with 3 month hx of chest pain, atypical, some reproducibility with palpation, no other associated symptoms.  Has been seen by [facility] cardiology who per notes, felt this to be non-cardiac, stress echo was done last month which was negative.  Pt has FH of CAD, but her pain sounds very atypical.  Nothing to suggest PE, PTX, dissection.  EKG unchanged.  Will check troponin, but have low suspicion of ACS  Troponin negative.  Advised pt to f/u with her cardiologist        Rolan Bucco, MD 01/28/11 [phone]

## 2011-01-28 NOTE — ED Notes (Signed)
Pt states was woken up from her sleep tonight with mid-left sided chest pains that radiates around to the back. Pain is constant. Described as squeezing. Denies associated symptoms. Worsens with laying down. States has been having these same chest pains for the last three months and she has been evaluated for same. She is here tonight because this is the first time she's had the pain radiate around to the back.

## 2011-02-11 ENCOUNTER — Ambulatory Visit (HOSPITAL_BASED_OUTPATIENT_CLINIC_OR_DEPARTMENT_OTHER)
Admission: RE | Admit: 2011-02-11 | Discharge: 2011-02-11 | Disposition: A | Discharge status: Home / Self Care | Payer: Self-pay | Source: Ambulatory Visit | Attending: Family Medicine | Admitting: Family Medicine

## 2011-02-11 DIAGNOSIS — Z1231 Encounter for screening mammogram for malignant neoplasm of breast: Secondary | ICD-10-CM | POA: Insufficient documentation

## 2011-03-20 ENCOUNTER — Encounter (HOSPITAL_BASED_OUTPATIENT_CLINIC_OR_DEPARTMENT_OTHER): Payer: Self-pay

## 2011-03-20 ENCOUNTER — Emergency Department (HOSPITAL_BASED_OUTPATIENT_CLINIC_OR_DEPARTMENT_OTHER)
Admission: EM | Admit: 2011-03-20 | Discharge: 2011-03-20 | Disposition: A | Discharge status: Home / Self Care | Payer: Self-pay | Attending: Emergency Medicine | Admitting: Emergency Medicine

## 2011-03-20 DIAGNOSIS — K089 Disorder of teeth and supporting structures, unspecified: Secondary | ICD-10-CM | POA: Insufficient documentation

## 2011-03-20 DIAGNOSIS — J329 Chronic sinusitis, unspecified: Secondary | ICD-10-CM | POA: Insufficient documentation

## 2011-03-20 DIAGNOSIS — R51 Headache: Secondary | ICD-10-CM | POA: Insufficient documentation

## 2011-03-20 DIAGNOSIS — E785 Hyperlipidemia, unspecified: Secondary | ICD-10-CM | POA: Insufficient documentation

## 2011-03-20 MED ORDER — AZITHROMYCIN 250 MG PO TABS: 250.0000 mg | ORAL_TABLET | Freq: Every day | ORAL | Status: AC

## 2011-03-20 MED ORDER — HYDROCODONE-ACETAMINOPHEN 5-325 MG PO TABS: 2.0000 | ORAL_TABLET | ORAL | Status: AC | PRN

## 2011-03-20 NOTE — Discharge Instructions (Signed)

## 2011-03-20 NOTE — ED Provider Notes (Signed)
Evaluation and management procedures were performed by the PA/NP under my supervision/collaboration.   Felisa Bonier, MD 03/20/11 2039

## 2011-03-20 NOTE — ED Notes (Signed)
Pt states that she woke up this morning with headache, sinus pressure, toothache and sore gums.  Pt states that she took a goody's powder about an hour ago.  No relief.

## 2011-03-20 NOTE — ED Provider Notes (Signed)
History     CSN: [id number]  Arrival date & time 03/20/11  1138   First MD Initiated Contact with Patient 03/20/11 1316      Chief Complaint  Patient presents with  . Dental Pain  . Facial Pain    (Consider location/radiation/quality/duration/timing/severity/associated sxs/prior treatment) Patient is a 41 y.o. female presenting with tooth pain and sinusitis. The history is provided by the patient. No language interpreter was used.  Dental PainThe primary symptoms include mouth pain, headaches and cough. The symptoms began 2 days ago. The symptoms are worsening. The symptoms occur constantly.  Additional symptoms include: gum swelling and gum tenderness.   Sinusitis  Associated symptoms include cough.   Pt reports she has had sinus infection symptoms all week.  Pt reports now wisdom teeth are causing her pain Past Medical History  Diagnosis Date  . Depression     mood swings  . Hyperlipemia     Past Surgical History  Procedure Date  . Appendectomy 2009  . [place] 2008    Family History  Problem Relation Age of Onset  . Hypertension Mother   . Heart attack Mother 48    light MI  . Hypertension Father   . Diabetes Father     History  Substance Use Topics  . Smoking status: Never Smoker   . Smokeless tobacco: Never Used  . Alcohol Use: Yes     occasionally    OB History    Grav Para Term Preterm Abortions TAB SAB Ect Mult Living   3    3 3     0      Review of Systems  HENT: Positive for mouth sores and dental problem.   Respiratory: Positive for cough.   Neurological: Positive for headaches.  All other systems reviewed and are negative.    Allergies  Oxycodone  Home Medications   Current Outpatient Rx  Name Route Sig Dispense Refill  . CYCLOBENZAPRINE HCL 10 MG PO TABS Oral Take 10 mg by mouth at bedtime as needed. for back spasm     . AZITHROMYCIN 250 MG PO TABS Oral Take 1 tablet (250 mg total) by mouth daily. Take first 2 tablets together, then 1  every day until finished. 6 tablet 0  . HYDROCODONE-ACETAMINOPHEN 5-325 MG PO TABS Oral Take 2 tablets by mouth every 4 (four) hours as needed for pain. 10 tablet 0  . NITROGLYCERIN 0.4 MG SL SUBL Sublingual Place 1 tablet (0.4 mg total) under the tongue every 5 (five) minutes as needed for chest pain. 90 tablet 12    BP 122/93  Pulse 89  Temp(Src) 98.6 F (37 C) (Oral)  Resp 16  Ht 5\' 5"  (1.651 m)  Wt 174 lb (78.926 kg)  BMI 28.96 kg/m2  SpO2 99%  LMP 03/04/2011  Physical Exam  Vitals reviewed. Constitutional: She is oriented to person, place, and time. She appears well-developed and well-nourished.  HENT:  Head: Normocephalic and atraumatic.  Eyes: Conjunctivae are normal. Pupils are equal, round, and reactive to light.  Neck: Normal range of motion.  Cardiovascular: Normal rate.   Pulmonary/Chest: Effort normal.  Abdominal: Soft.  Musculoskeletal: Normal range of motion.  Neurological: She is alert and oriented to person, place, and time. She has normal reflexes.  Skin: Skin is warm.  Psychiatric: She has a normal mood and affect.    ED Course  Procedures (including critical care time)  Labs Reviewed - No data to display No results found.   1. Sinusitis  MDM  Pt ask for zithromax.  Pt reports this works better than other antibiotics.   I advised pt to follow up with her MD on Monday for recheck  Also see Oral surgeon for evaluation       Langston Masker, [place] 03/20/11 1439

## 2011-03-25 ENCOUNTER — Encounter: Payer: Self-pay | Admitting: Obstetrics & Gynecology

## 2011-03-25 ENCOUNTER — Ambulatory Visit (INDEPENDENT_AMBULATORY_CARE_PROVIDER_SITE_OTHER): Payer: Self-pay | Admitting: Obstetrics & Gynecology

## 2011-03-25 VITALS — BP 131/85 | HR 83 | Temp 98.2°F | Ht 65.5 in | Wt 179.2 lb

## 2011-03-25 DIAGNOSIS — Z01419 Encounter for gynecological examination (general) (routine) without abnormal findings: Secondary | ICD-10-CM

## 2011-03-25 DIAGNOSIS — Z0000 Encounter for general adult medical examination without abnormal findings: Secondary | ICD-10-CM

## 2011-03-25 DIAGNOSIS — R87612 Low grade squamous intraepithelial lesion on cytologic smear of cervix (LGSIL): Secondary | ICD-10-CM

## 2011-03-25 MED ORDER — CITALOPRAM HYDROBROMIDE 20 MG PO TABS: 20.0000 mg | ORAL_TABLET | Freq: Every day | ORAL | Status: DC

## 2011-03-25 NOTE — Progress Notes (Signed)
Subjective:    Diane Sexton is a 41 y.o. female who presents for an annual exam. She complains of PMDD. She would like to start an SSRI. The patient is sexually active in a same sex relationship. GYN screening history: last pap: was normal. The patient wears seatbelts: yes. The patient participates in regular exercise: not asked. Has the patient ever been transfused or tattooed?: not asked. The patient reports that there is not domestic violence in her life.   Menstrual History: OB History    Grav Para Term Preterm Abortions TAB SAB Ect Mult Living   3    3 3     0      Menarche age: 25 Patient's last menstrual period was 03/04/2011.    The following portions of the patient's history were reviewed and updated as appropriate: allergies, current medications, past family history, past medical history, past social history, past surgical history and problem list.  Review of Systems A comprehensive review of systems was negative. mammogram UTD   Objective:    BP 131/85  Pulse 83  Temp(Src) 98.2 F (36.8 C) (Oral)  Ht 5' 5.5" (1.664 m)  Wt 179 lb 3.2 oz (81.285 kg)  BMI 29.37 kg/m2  LMP 03/04/2011  General Appearance:    Alert, cooperative, no distress, appears stated age  Head:    Normocephalic, without obvious abnormality, atraumatic  Eyes:    PERRL, conjunctiva/corneas clear, EOM's intact, fundi    benign, both eyes  Ears:    Normal TM's and external ear canals, both ears  Nose:   Nares normal, septum midline, mucosa normal, no drainage    or sinus tenderness  Throat:   Lips, mucosa, and tongue normal; teeth and gums normal  Neck:   Supple, symmetrical, trachea midline, no adenopathy;    thyroid:  no enlargement/tenderness/nodules; no carotid   bruit or JVD  Back:     Symmetric, no curvature, ROM normal, no CVA tenderness  Lungs:     Clear to auscultation bilaterally, respirations unlabored  Chest Wall:    No tenderness or deformity   Heart:    Regular rate and rhythm, S1  and S2 normal, no murmur, rub   or gallop  Breast Exam:    No tenderness, masses, or nipple abnormality  Abdomen:     Soft, non-tender, bowel sounds active all four quadrants,    no masses, no organomegaly  Genitalia:    Normal female without lesion, discharge or tenderness     Extremities:   Extremities normal, atraumatic, no cyanosis or edema  Pulses:   2+ and symmetric all extremities  Skin:   Skin color, texture, turgor normal, no rashes or lesions  Lymph nodes:   Cervical, supraclavicular, and axillary nodes normal  Neurologic:   CNII-XII intact, normal strength, sensation and reflexes    throughout  .    Assessment:    Healthy female exam.  PMDD   Plan:     Pap smear.  Celexa 20 mg after she finishes her erythromycin F/U 2 months

## 2011-04-01 ENCOUNTER — Telehealth: Payer: Self-pay | Admitting: *Deleted

## 2011-04-01 NOTE — Telephone Encounter (Signed)
Pt called wanting results of pap smear.

## 2011-04-02 NOTE — Telephone Encounter (Signed)
Called pt and informed her of Pap result is still the same as her previous results. Per Dr. Marice Potter, pt needs to continue with Pap every 6 mos and no further procedures or tests are indicated @ this time. Pt also informed of Neg GC/Chlamydia result. Pt voiced understanding of all information.

## 2011-04-05 ENCOUNTER — Encounter: Payer: Self-pay | Admitting: Family Medicine

## 2011-04-05 ENCOUNTER — Ambulatory Visit (INDEPENDENT_AMBULATORY_CARE_PROVIDER_SITE_OTHER): Payer: Self-pay | Admitting: Family Medicine

## 2011-04-05 VITALS — BP 129/80 | HR 80 | Ht 65.5 in | Wt 174.5 lb

## 2011-04-05 DIAGNOSIS — E785 Hyperlipidemia, unspecified: Secondary | ICD-10-CM

## 2011-04-05 DIAGNOSIS — E669 Obesity, unspecified: Secondary | ICD-10-CM | POA: Insufficient documentation

## 2011-04-05 DIAGNOSIS — N943 Premenstrual tension syndrome: Secondary | ICD-10-CM

## 2011-04-05 DIAGNOSIS — K089 Disorder of teeth and supporting structures, unspecified: Secondary | ICD-10-CM

## 2011-04-05 DIAGNOSIS — K0889 Other specified disorders of teeth and supporting structures: Secondary | ICD-10-CM

## 2011-04-05 NOTE — Assessment & Plan Note (Signed)
No obvious infection or abscess, no need for antibiotics at this time.  Will make routine dental referral for pt for possible wisdom tooth extraction.

## 2011-04-05 NOTE — Patient Instructions (Signed)
It was great to see you today! Keep taking the Celexa, give it a few more weeks to see if the nausea gets better.  If not, we can talk about other medication options.  Come back to have some FASTING labs drawn, we will check your cholesterol, blood sugar, and kidneys to make sure everything looks ok. For your weight loss, you can try a phone app like My Fitness Pal, and see if that helps.  Or, if you would like, you can come back in and we can discuss diet and exercise strategies.  We will put in a referral for the dentist to try to get your wisdom teeth out.  Come back and see me in 2-3 months.

## 2011-04-05 NOTE — Assessment & Plan Note (Signed)
Started on Celexa a few weeks ago by GYN.  Having some nausea/intolerance but willing to continue for another few weeks to see if this subsides.  No SI/HI.

## 2011-04-05 NOTE — Assessment & Plan Note (Addendum)
Weight stable, BMI elevated at 28.6.  Informed pt to f/u if she would like assistance.   Wt Readings from Last 3 Encounters:  04/05/11 174 lb 8 oz (79.153 kg)  03/25/11 179 lb 3.2 oz (81.285 kg)  03/20/11 174 lb (78.926 kg)

## 2011-04-05 NOTE — Assessment & Plan Note (Signed)
Last FLP in 2011, pt not on statin or fish oil.  Pt will return for fasting lipids.

## 2011-04-05 NOTE — Progress Notes (Signed)
S: Pt comes in today for f/u.  TOOTH PAIN Wisdom teeth are giving her problems.  Has soreness in her mouth and face.  No fevers or chills.  No redness in face.  Has noticed swelling on jaw-line area.  Pain is worse first thing in the morning.  Will use Goody powder occasionally when needed, uses this 2x/week.     PMDD Started on Celexa 2/21 for this, but is making her sick-- nauseated.  Is still taking it currently but wondering if she should continue.  Is able to eat and keep food down, just some nausea.  Almost feels like motion sickness.  2 weeks before cycle starts she is crying uncontrollably or very mean/angry or very sensitive.  Occasionally feels like she wants to hurt herself, but has no plans.  No current SI/HI.   HYPERLIPIDEMIA and OBESITY Meds: NONE Last FLP or LDL:  Lab Results  Component Value Date   LDLCALC 183* 05/21/2009   Lab Results  Component Value Date   CHOL 264* 05/21/2009   HDL 50 05/21/2009   LDLCALC 183* 05/21/2009   LDLDIRECT 174* 12/24/2009   TRIG 156* 05/21/2009   CHOLHDL 5.3 Ratio 05/21/2009    Diet: lots of dairy, lots of ranch dressing on salads; mostly chicken and fish, less red meats  Exercise: walks 1/week for 30 min; goes to gym 2x/week  Weight:  Wt Readings from Last 3 Encounters:  04/05/11 174 lb 8 oz (79.153 kg)  03/25/11 179 lb 3.2 oz (81.285 kg)  03/20/11 174 lb (78.926 kg)     ROS: Per HPI  History  Smoking status  . Never Smoker   Smokeless tobacco  . Never Used    O:  Filed Vitals:   04/05/11 1344  BP: 129/80  Pulse: 80    Gen: NAD CV: RRR, no murmur Pulm: CTA bilat, no wheezes or crackles Ext: Warm, no chronic skin changes, no edema   A/P: 41 y.o. female p/w HLD, obesity, dental pain -See problem list -f/u in 3 months

## 2011-04-06 ENCOUNTER — Other Ambulatory Visit: Payer: Self-pay

## 2011-04-07 ENCOUNTER — Telehealth: Payer: Self-pay | Admitting: Family Medicine

## 2011-04-07 NOTE — Telephone Encounter (Signed)
Is asking about referral to dentist to have wisdom teeth pulled - she is having headaches and thinks that's what is causing it.

## 2011-04-07 NOTE — Telephone Encounter (Signed)
Advised pt referral has been sent to Dental Clinic and that she will have to wait for a call from them. Pt understood.

## 2011-04-26 ENCOUNTER — Encounter (HOSPITAL_COMMUNITY): Payer: Self-pay | Admitting: Emergency Medicine

## 2011-04-26 ENCOUNTER — Emergency Department (HOSPITAL_COMMUNITY)
Admission: EM | Admit: 2011-04-26 | Discharge: 2011-04-27 | Disposition: A | Discharge status: Home / Self Care | Payer: Self-pay | Attending: Emergency Medicine | Admitting: Emergency Medicine

## 2011-04-26 DIAGNOSIS — R45851 Suicidal ideations: Secondary | ICD-10-CM | POA: Insufficient documentation

## 2011-04-26 DIAGNOSIS — E785 Hyperlipidemia, unspecified: Secondary | ICD-10-CM | POA: Insufficient documentation

## 2011-04-26 DIAGNOSIS — F329 Major depressive disorder, single episode, unspecified: Secondary | ICD-10-CM | POA: Insufficient documentation

## 2011-04-26 DIAGNOSIS — F3289 Other specified depressive episodes: Secondary | ICD-10-CM | POA: Insufficient documentation

## 2011-04-26 DIAGNOSIS — Z79899 Other long term (current) drug therapy: Secondary | ICD-10-CM | POA: Insufficient documentation

## 2011-04-26 LAB — CBC
HCT: 37.9 % (ref 36.0–46.0)
Hemoglobin: 13.2 g/dL (ref 12.0–15.0)
MCV: 79.3 fL (ref 78.0–100.0)
RBC: 4.78 MIL/uL (ref 3.87–5.11)
RDW: 14.1 % (ref 11.5–15.5)
WBC: 13.2 10*3/uL — ABNORMAL HIGH (ref 4.0–10.5)

## 2011-04-26 LAB — DIFFERENTIAL
Eosinophils Relative: 2 % (ref 0–5)
Lymphocytes Relative: 30 % (ref 12–46)
Lymphs Abs: 4 10*3/uL (ref 0.7–4.0)
Monocytes Absolute: 0.6 10*3/uL (ref 0.1–1.0)
Monocytes Relative: 4 % (ref 3–12)

## 2011-04-26 LAB — BASIC METABOLIC PANEL
CO2: 24 mEq/L (ref 19–32)
Calcium: 9.1 mg/dL (ref 8.4–10.5)
Creatinine, Ser: 0.7 mg/dL (ref 0.50–1.10)
Glucose, Bld: 84 mg/dL (ref 70–99)

## 2011-04-26 LAB — ETHANOL: Alcohol, Ethyl (B): <11 mg/dL (ref 0–11)

## 2011-04-26 MED ORDER — ONDANSETRON HCL 8 MG PO TABS: 4.0000 mg | ORAL_TABLET | Freq: Three times a day (TID) | ORAL | Status: DC | PRN

## 2011-04-26 MED ORDER — ALUM & MAG HYDROXIDE-SIMETH 200-200-20 MG/5ML PO SUSP: 30.0000 mL | ORAL | Status: DC | PRN

## 2011-04-26 MED ORDER — ZOLPIDEM TARTRATE 5 MG PO TABS: 5.0000 mg | ORAL_TABLET | Freq: Every evening | ORAL | Status: DC | PRN

## 2011-04-26 MED ORDER — LORAZEPAM 1 MG PO TABS: 1.0000 mg | ORAL_TABLET | Freq: Three times a day (TID) | ORAL | Status: DC | PRN

## 2011-04-26 MED ORDER — NICOTINE 21 MG/24HR TD PT24: 21.0000 mg | MEDICATED_PATCH | Freq: Every day | TRANSDERMAL | Status: DC

## 2011-04-26 MED ORDER — CITALOPRAM HYDROBROMIDE 20 MG PO TABS
20.0000 mg | ORAL_TABLET | Freq: Every day | ORAL | Status: DC
  Filled 2011-04-26: qty 1

## 2011-04-26 NOTE — ED Notes (Signed)
PT. REPORTS SUICIDAL IDEATION /DEPRESSION / FREQUENT CRYING SPELLS FOR SEVERAL DAYS , (PLANS TO OD ON PILLS). SECURITY NOTIFIED TO WAND PT. PAPER SCRUBS GIVEN TO PT. AT TRIAGE.

## 2011-04-26 NOTE — ED Notes (Signed)
PT [facility] AND SITTER REQUESTED.

## 2011-04-26 NOTE — ED Provider Notes (Signed)
History     CSN: [id number]  Arrival date & time 04/26/11  2219   First MD Initiated Contact with Patient 04/26/11 2303      Chief Complaint  Patient presents with  . Suicidal    (Consider location/radiation/quality/duration/timing/severity/associated sxs/prior treatment) HPI  41yoF history of depression presents with suicidal ideation and worsening depression. She states that she usually has premenstrual depression. She states she's been crying for several days which has been worse today. She's reports uncontrolled crying today. She states she's had thoughts of going to hurt herself. She plans to overdose on pills "to sleep forever". Denies SI currently. She states she had one attempted suicide several years ago with overdose by pills. She reports poor family support "I personally". She denies ethanol, illicit drugs. She denies homicidal ideation, auditory visual hallucinations. She states she has been mostly compliant with her Celexa but that she hasn't taken it for the last 2 days. Denies history of psychiatric admissions. Denies headache/dizziness/cp/sob/abd pain/n/v/f/c   ED Notes, ED Provider Notes from 04/26/11 0000 to 04/26/11 22:28:05       Nada Libman, RN 04/26/2011 22:26      PT. REPORTS SUICIDAL IDEATION /DEPRESSION / FREQUENT CRYING SPELLS FOR SEVERAL DAYS , (PLANS TO OD ON PILLS). SECURITY NOTIFIED TO WAND PT. PAPER SCRUBS GIVEN TO PT. AT TRIAGE.     Past Medical History  Diagnosis Date  . Depression     mood swings  . Hyperlipemia     Past Surgical History  Procedure Date  . Appendectomy 2009  . [place] 2008    Family History  Problem Relation Age of Onset  . Hypertension Mother   . Heart attack Mother 48    light MI  . Hypertension Father   . Diabetes Father     History  Substance Use Topics  . Smoking status: Never Smoker   . Smokeless tobacco: Never Used  . Alcohol Use: Yes     occasionally    OB History    Grav Para Term Preterm  Abortions TAB SAB Ect Mult Living   3    3 3     0      Review of Systems  All other systems reviewed and are negative.   except as noted HPI   Allergies  Oxycodone  Home Medications   Current Outpatient Rx  Name Route Sig Dispense Refill  . CITALOPRAM HYDROBROMIDE 20 MG PO TABS Oral Take 20 mg by mouth daily.    . CYCLOBENZAPRINE HCL 10 MG PO TABS Oral Take 10 mg by mouth at bedtime as needed. for back spasm    . NITROGLYCERIN 0.4 MG SL SUBL Sublingual Place 0.4 mg under the tongue every 5 (five) minutes as needed. For chest pain    . FLUOXETINE HCL 20 MG PO TABS Oral Take 1 tablet (20 mg total) by mouth daily. 30 tablet 0    BP 134/98  Pulse 77  Temp(Src) 98.3 F (36.8 C) (Oral)  Resp 19  SpO2 100%  LMP 03/29/2011  Physical Exam  Nursing note and vitals reviewed. Constitutional: She is oriented to person, place, and time. She appears well-developed.  HENT:  Head: Atraumatic.  Mouth/Throat: Oropharynx is clear and moist.  Eyes: Conjunctivae and EOM are normal. Pupils are equal, round, and reactive to light.  Neck: Normal range of motion. Neck supple.  Cardiovascular: Normal rate, regular rhythm, normal heart sounds and intact distal pulses.   Pulmonary/Chest: Effort normal and breath sounds normal. No respiratory distress.  She has no wheezes. She has no rales.  Abdominal: Soft. She exhibits no distension. There is no tenderness. There is no rebound and no guarding.  Musculoskeletal: Normal range of motion.  Neurological: She is alert and oriented to person, place, and time.  Skin: Skin is warm and dry. No rash noted.  Psychiatric: She has a normal mood and affect.       Good insight    ED Course  Procedures (including critical care time)  Labs Reviewed  CBC - Abnormal; Notable for the following:    WBC 13.2 (*)    All other components within normal limits  DIFFERENTIAL - Abnormal; Notable for the following:    Neutro Abs 8.3 (*)    All other components  within normal limits  BASIC METABOLIC PANEL - Abnormal; Notable for the following:    Potassium 3.3 (*)    All other components within normal limits  SALICYLATE LEVEL - Abnormal; Notable for the following:    Salicylate Lvl <2.0 (*)    All other components within normal limits  ETHANOL  URINE RAPID DRUG SCREEN (HOSP PERFORMED)  ACETAMINOPHEN LEVEL  POCT PREGNANCY, URINE  URINALYSIS, ROUTINE W REFLEX MICROSCOPIC  PREGNANCY, URINE   No results found.   1. Suicidal ideation   2. Depression     MDM  Soft SI and worsening depression with menstrual cycles. ACT consult pending. Needs telepsych. Anticipate medical clearance. Labs pending.   Medically cleared. Telepsych consult complete. Recommend d/c celexa and change to prozac. Ok for discharge home. Discussed with ACT who will give pt outpatient resources for follow up.        Forbes Cellar, MD 04/27/11 [phone]

## 2011-04-26 NOTE — ED Notes (Signed)
Pt reports having hx of "PMDD" and has being having "different emotions every month, sometimes I am angry and sometimes I cry all day for nothing or just don't want to be bother and sit in my house and go no where". Pt reports having suicidal thoughts today and states "today I went to school and came home. I sat in my car and didn't want to go in the house, I was feeling depressed, lonely and feeling nothing. I wanted to go in the house go to sleep and never wake up. I am tired of feeling this way every month".  Pt states "either I went in the house and went to sleep and not wake up or I come here for some help, so I came here". Pt reports having these emotions every month "for years", have seek help but has not seem to find anything for help including medications. Pt is depressed, verbalized suicidal thoughts, denies any HI.

## 2011-04-26 NOTE — ED Notes (Signed)
ACT Team at bedside for pt evaluation. 

## 2011-04-27 LAB — RAPID URINE DRUG SCREEN, HOSP PERFORMED: Benzodiazepines: NOT DETECTED

## 2011-04-27 MED ORDER — FLUOXETINE HCL 20 MG PO TABS: 20.0000 mg | ORAL_TABLET | Freq: Every day | ORAL | Status: DC

## 2011-04-27 MED ORDER — POTASSIUM CHLORIDE 20 MEQ/15ML (10%) PO LIQD
20.0000 meq | Freq: Once | ORAL | Status: AC
  Administered 2011-04-27: 20 meq via ORAL
  Filled 2011-04-27: qty 15

## 2011-04-27 NOTE — ED Notes (Signed)
Tele-psych eval in progress.

## 2011-04-27 NOTE — Discharge Instructions (Signed)
Depression You have signs of depression. This is a common problem. It can occur at any age. It is often hard to recognize. People can suffer from depression and still have moments of enjoyment. Depression interferes with your basic ability to function in life. It upsets your relationships, sleep, eating, and work habits. CAUSES  Depression is believed to be caused by an imbalance in brain chemicals. It may be triggered by an unpleasant event. Relationship crises, a death in the family, financial worries, retirement, or other stressors are normal causes of depression. Depression may also start for no known reason. Other factors that may play a part include medical illnesses, some medicines, genetics, and alcohol or drug abuse. SYMPTOMS   Feeling unhappy or worthless.   Long-lasting (chronic) tiredness or worn-out feeling.   Self-destructive thoughts and actions.   Not being able to sleep or sleeping too much.   Eating more than usual or not eating at all.   Headaches or feeling anxious.   Trouble concentrating or making decisions.   Unexplained physical problems and substance abuse.  TREATMENT  Depression usually gets better with treatment. This can include:  Antidepressant medicines. It can take weeks before the proper dose is achieved and benefits are reached.   Talking with a therapist, clergyperson, counselor, or friend. These people can help you gain insight into your problem and regain control of your life.   Eating a good diet.   Getting regular physical exercise, such as walking for 30 minutes every day.   Not abusing alcohol or drugs.  Treating depression often takes 6 months or longer. This length of treatment is needed to keep symptoms from returning. Call your caregiver and arrange for follow-up care as suggested. SEEK IMMEDIATE MEDICAL CARE IF:   You start to have thoughts of hurting yourself or others.   Call your local emergency services (911 in [place]   Go to  your local medical emergency department.   Call the National Suicide Prevention Lifeline: 1-800-273-TALK (984)827-3452).  Document Released: 01/18/2005 Document Revised: 01/07/2011 Document Reviewed: 06/20/2009 [facility] Patient Information 2012 [place], [place].  RESOURCE GUIDE  Dental Problems  Patients with Medicaid: [facility] [id number]                                           [phone] [address] Phone:  [phone]                                                   Phone:  [phone]  If unable to pay or uninsured, contact:  [facility] or [facility]. to become qualified for the adult dental clinic.  Chronic Pain Problems Contact [facility]  [phone] Patients need to be referred by their primary care doctor.  Insufficient Money for Medicine Contact United Way:  call "211" or [facility] 934-864-2008.  No Primary Care Doctor Call [facility]  [phone] Other agencies that provide inexpensive medical care    [facility]  [phone]    [facility]  [phone]    [facility]  [phone]    [facility]  [phone]    [facility]  [phone]    [facility]  [phone]  Psychological Services [facility]  [phone] [facility]  [phone] [facility]   [phone] (emergency services [phone])  Abuse/Neglect [facility] Child Abuse Hotline [phone] [facility] Child Abuse Hotline 8144072480 (After Hours)  Emergency Shelter [facility] [phone]  Maternity Homes Room at the [place] [facility] [place] [phone] [facility] [phone]  MRSA Hotline #:   680-633-7691    [facility] Resources  [facility]  [employer]                           [facility] Dept. [address] [place]                      [address]         [address]  [place]ell Reveal Phone:  [phone]                                  Phone:  [phone]                   Phone:  [phone]  [facility] Phone:  [phone]  [facility] Child Abuse Hotline 216-296-1368 [phone] (After Hours)
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
## 2011-05-07 ENCOUNTER — Encounter (HOSPITAL_COMMUNITY): Payer: Self-pay | Admitting: Psychiatry

## 2011-05-07 ENCOUNTER — Ambulatory Visit (INDEPENDENT_AMBULATORY_CARE_PROVIDER_SITE_OTHER): Payer: Self-pay | Admitting: Psychiatry

## 2011-05-07 VITALS — BP 138/89 | HR 82 | Ht 65.0 in | Wt 168.0 lb

## 2011-05-07 DIAGNOSIS — F3181 Bipolar II disorder: Secondary | ICD-10-CM

## 2011-05-07 DIAGNOSIS — F3189 Other bipolar disorder: Secondary | ICD-10-CM

## 2011-05-07 MED ORDER — LITHIUM CARBONATE 150 MG PO CAPS: 150.0000 mg | ORAL_CAPSULE | Freq: Two times a day (BID) | ORAL | Status: DC
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
## 2011-05-11 ENCOUNTER — Other Ambulatory Visit: Payer: Self-pay

## 2011-05-11 DIAGNOSIS — N943 Premenstrual tension syndrome: Secondary | ICD-10-CM

## 2011-05-11 DIAGNOSIS — E669 Obesity, unspecified: Secondary | ICD-10-CM

## 2011-05-11 DIAGNOSIS — E785 Hyperlipidemia, unspecified: Secondary | ICD-10-CM

## 2011-05-11 DIAGNOSIS — F3181 Bipolar II disorder: Secondary | ICD-10-CM | POA: Insufficient documentation

## 2011-05-11 LAB — LIPID PANEL
Cholesterol: 239 mg/dL — ABNORMAL HIGH (ref 0–200)
Triglycerides: 193 mg/dL — ABNORMAL HIGH (ref <150)
VLDL: 39 mg/dL (ref 0–40)

## 2011-05-11 LAB — BASIC METABOLIC PANEL
BUN: 10 mg/dL (ref 6–23)
CO2: 22 mEq/L (ref 19–32)
Chloride: 103 mEq/L (ref 96–112)
Glucose, Bld: 85 mg/dL (ref 70–99)
Potassium: 3.7 mEq/L (ref 3.5–5.3)

## 2011-05-11 NOTE — Progress Notes (Signed)
Bmp,flp and hiv done today [facility] Jeliyah Middlebrooks

## 2011-05-12 ENCOUNTER — Encounter: Payer: Self-pay | Admitting: Family Medicine

## 2011-05-13 ENCOUNTER — Telehealth: Payer: Self-pay | Admitting: Family Medicine

## 2011-05-13 NOTE — Telephone Encounter (Signed)
Patient is calling for her lab results.  

## 2011-05-13 NOTE — Telephone Encounter (Signed)
Called pt. Dr.McGill will look at her labs and we will call her. Pt agreed. Marland KitchenLorenda Hatchet, Renato Battles

## 2011-05-14 NOTE — Telephone Encounter (Signed)
Patient is calling back because she still has not heard back from anyone.

## 2011-05-16 NOTE — Telephone Encounter (Signed)
I sent her a letter last week.  Her labs are all normal with the exception of her cholesterol being high.  I would like for her to make an appt with  Me to discuss medication vs lifestyle change options.

## 2011-05-17 NOTE — Telephone Encounter (Signed)
LMOM advising pt of results and to make an appt to F/U.

## 2011-05-18 ENCOUNTER — Telehealth (HOSPITAL_COMMUNITY): Payer: Self-pay

## 2011-05-18 ENCOUNTER — Encounter (HOSPITAL_COMMUNITY): Payer: Self-pay | Admitting: Psychiatry

## 2011-05-18 NOTE — Telephone Encounter (Signed)
Called patient regarding school note.

## 2011-05-19 NOTE — Telephone Encounter (Signed)
Letter has been completed

## 2011-05-21 ENCOUNTER — Ambulatory Visit (HOSPITAL_COMMUNITY): Payer: Self-pay | Admitting: Psychiatry

## 2011-05-27 ENCOUNTER — Encounter (HOSPITAL_COMMUNITY): Payer: Self-pay | Admitting: Psychiatry

## 2011-05-27 ENCOUNTER — Ambulatory Visit (INDEPENDENT_AMBULATORY_CARE_PROVIDER_SITE_OTHER): Payer: Self-pay | Admitting: Psychiatry

## 2011-05-27 VITALS — BP 125/85 | HR 75 | Ht 65.0 in | Wt 168.0 lb

## 2011-05-27 DIAGNOSIS — F3181 Bipolar II disorder: Secondary | ICD-10-CM

## 2011-05-27 DIAGNOSIS — F3189 Other bipolar disorder: Secondary | ICD-10-CM
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
## 2011-06-04 ENCOUNTER — Ambulatory Visit: Payer: Self-pay | Admitting: Obstetrics & Gynecology

## 2011-06-09 ENCOUNTER — Emergency Department (HOSPITAL_BASED_OUTPATIENT_CLINIC_OR_DEPARTMENT_OTHER)
Admission: EM | Admit: 2011-06-09 | Discharge: 2011-06-09 | Disposition: A | Discharge status: Home / Self Care | Payer: Self-pay | Attending: Emergency Medicine | Admitting: Emergency Medicine

## 2011-06-09 ENCOUNTER — Encounter (HOSPITAL_BASED_OUTPATIENT_CLINIC_OR_DEPARTMENT_OTHER): Payer: Self-pay

## 2011-06-09 ENCOUNTER — Emergency Department (INDEPENDENT_AMBULATORY_CARE_PROVIDER_SITE_OTHER): Payer: Self-pay

## 2011-06-09 DIAGNOSIS — IMO0001 Reserved for inherently not codable concepts without codable children: Secondary | ICD-10-CM | POA: Insufficient documentation

## 2011-06-09 DIAGNOSIS — R059 Cough, unspecified: Secondary | ICD-10-CM | POA: Insufficient documentation

## 2011-06-09 DIAGNOSIS — R6883 Chills (without fever): Secondary | ICD-10-CM | POA: Insufficient documentation

## 2011-06-09 DIAGNOSIS — R05 Cough: Secondary | ICD-10-CM

## 2011-06-09 DIAGNOSIS — B9789 Other viral agents as the cause of diseases classified elsewhere: Secondary | ICD-10-CM | POA: Insufficient documentation

## 2011-06-09 DIAGNOSIS — B349 Viral infection, unspecified: Secondary | ICD-10-CM

## 2011-06-09 DIAGNOSIS — R509 Fever, unspecified: Secondary | ICD-10-CM | POA: Insufficient documentation

## 2011-06-09 DIAGNOSIS — R070 Pain in throat: Secondary | ICD-10-CM | POA: Insufficient documentation

## 2011-06-09 LAB — RAPID STREP SCREEN (MED CTR MEBANE ONLY): Streptococcus, Group A Screen (Direct): NEGATIVE

## 2011-06-09 MED ORDER — BENZONATATE 100 MG PO CAPS: 100.0000 mg | ORAL_CAPSULE | Freq: Three times a day (TID) | ORAL | Status: AC

## 2011-06-09 MED ORDER — IBUPROFEN 800 MG PO TABS
ORAL_TABLET | ORAL | Status: AC
  Filled 2011-06-09: qty 1

## 2011-06-09 MED ORDER — IBUPROFEN 800 MG PO TABS
800.0000 mg | ORAL_TABLET | Freq: Once | ORAL | Status: AC
  Administered 2011-06-09: 800 mg via ORAL

## 2011-06-09 NOTE — ED Provider Notes (Signed)
History     CSN: [id number]  Arrival date & time 06/09/11  1851   First MD Initiated Contact with Patient 06/09/11 1907      No chief complaint on file.   (Consider location/radiation/quality/duration/timing/severity/associated sxs/prior treatment) Patient is a 41 y.o. female presenting with URI. The history is provided by the patient. No language interpreter was used.  URI The primary symptoms include fever, sore throat, cough and myalgias. Primary symptoms do not include rash. The current episode started yesterday. This is a new problem. The problem has been rapidly worsening.  Symptoms associated with the illness include chills.    Past Medical History  Diagnosis Date  . Depression     mood swings  . Hyperlipemia     Past Surgical History  Procedure Date  . Appendectomy 2009  . [place] 2008    Family History  Problem Relation Age of Onset  . Hypertension Mother   . Heart attack Mother 48    light MI  . Hypertension Father   . Diabetes Father     History  Substance Use Topics  . Smoking status: Never Smoker   . Smokeless tobacco: Never Used  . Alcohol Use: Yes     occasionally    OB History    Grav Para Term Preterm Abortions TAB SAB Ect Mult Living   3    3 3     0      Review of Systems  Constitutional: Positive for fever and chills.  HENT: Positive for sore throat.   Respiratory: Positive for cough.   Cardiovascular: Negative.   Musculoskeletal: Positive for myalgias.  Skin: Negative for rash.  Neurological: Negative.     Allergies  Oxycodone  Home Medications   Current Outpatient Rx  Name Route Sig Dispense Refill  . CYCLOBENZAPRINE HCL 10 MG PO TABS Oral Take 10 mg by mouth at bedtime as needed. for back spasm    . LITHIUM CARBONATE 150 MG PO CAPS Oral Take 1 capsule (150 mg total) by mouth 2 (two) times daily with a meal. Take one capsule daily for 7 days, then increase to two capsules daily. 60 capsule 1    BP 139/96  Pulse 109  Temp  99.3 F (37.4 C)  Resp 20  Ht 5\' 5"  (1.651 m)  Wt 170 lb (77.111 kg)  BMI 28.29 kg/m2  SpO2 98%  LMP 05/27/2011  Physical Exam  Nursing note and vitals reviewed. Constitutional: She appears well-developed and well-nourished.  HENT:  Right Ear: External ear normal.  Left Ear: External ear normal.  Mouth/Throat: Posterior oropharyngeal erythema present.  Eyes: Conjunctivae and EOM are normal. Pupils are equal, round, and reactive to light.  Cardiovascular: Normal rate and regular rhythm.   Pulmonary/Chest: Effort normal and breath sounds normal.  Musculoskeletal: Normal range of motion.  Neurological: She is alert.  Skin: Skin is warm and dry.    ED Course  Procedures (including critical care time)   Labs Reviewed  RAPID STREP SCREEN   Dg Chest 2 View  06/09/2011  *RADIOLOGY REPORT*  Clinical Data: Cough, fever.  CHEST - 2 VIEW  Comparison: 11/18/2010  Findings: Lungs clear.  Heart size and pulmonary vascularity normal.  No effusion.  Visualized bones unremarkable.  IMPRESSION: No acute disease  Original Report Authenticated By: Thora Lance III, M.D.     1. Viral illness       MDM  No sign of bacterial illness will treat symptomatically  Teressa Lower, NP 06/09/11 2030

## 2011-06-09 NOTE — Discharge Instructions (Signed)
Antibiotic Nonuse  Your caregiver felt that the infection or problem was not one that would be helped with an antibiotic. Infections may be caused by viruses or bacteria. Only a caregiver can tell which one of these is the likely cause of an illness. A cold is the most common cause of infection in both adults and children. A cold is a virus. Antibiotic treatment will have no effect on a viral infection. Viruses can lead to many lost days of work caring for sick children and many missed days of school. Children may catch as many as 10 "colds" or "flus" per year during which they can be tearful, cranky, and uncomfortable. The goal of treating a virus is aimed at keeping the ill person comfortable. Antibiotics are medications used to help the body fight bacterial infections. There are relatively few types of bacteria that cause infections but there are hundreds of viruses. While both viruses and bacteria cause infection they are very different types of germs. A viral infection will typically go away by itself within 7 to 10 days. Bacterial infections may spread or get worse without antibiotic treatment. Examples of bacterial infections are:  Sore throats (like strep throat or tonsillitis).   Infection in the lung (pneumonia).   Ear and skin infections.  Examples of viral infections are:  Colds or flus.   Most coughs and bronchitis.   Sore throats not caused by Strep.   Runny noses.  It is often best not to take an antibiotic when a viral infection is the cause of the problem. Antibiotics can kill off the helpful bacteria that we have inside our body and allow harmful bacteria to start growing. Antibiotics can cause side effects such as allergies, nausea, and diarrhea without helping to improve the symptoms of the viral infection. Additionally, repeated uses of antibiotics can cause bacteria inside of our body to become resistant. That resistance can be passed onto harmful bacterial. The next time  you have an infection it may be harder to treat if antibiotics are used when they are not needed. Not treating with antibiotics allows our own immune system to develop and take care of infections more efficiently. Also, antibiotics will work better for us when they are prescribed for bacterial infections. Treatments for a child that is ill may include:  Give extra fluids throughout the day to stay hydrated.   Get plenty of rest.   Only give your child over-the-counter or prescription medicines for pain, discomfort, or fever as directed by your caregiver.   The use of a cool mist humidifier may help stuffy noses.   Cold medications if suggested by your caregiver.  Your caregiver may decide to start you on an antibiotic if:  The problem you were seen for today continues for a longer length of time than expected.   You develop a secondary bacterial infection.  SEEK MEDICAL CARE IF:  Fever lasts longer than 5 days.   Symptoms continue to get worse after 5 to 7 days or become severe.   Difficulty in breathing develops.   Signs of dehydration develop (poor drinking, rare urinating, dark colored urine).   Changes in behavior or worsening tiredness (listlessness or lethargy).  Document Released: 03/29/2001 Document Revised: 01/07/2011 Document Reviewed: 09/25/2008 Exit[employer] Patient Information 2012 [employer].Viral Infections A viral infection can be caused by different types of viruses.Most viral infections are not serious and resolve on their own. However, some infections may cause severe symptoms and may lead to further complications. SYMPTOMS Viruses   can frequently cause:  Minor sore throat.   Aches and pains.   Headaches.   Runny nose.   Different types of rashes.   Watery eyes.   Tiredness.   Cough.   Loss of appetite.   Gastrointestinal infections, resulting in nausea, vomiting, and diarrhea.  These symptoms do not respond to antibiotics because the infection  is not caused by bacteria. However, you might catch a bacterial infection following the viral infection. This is sometimes called a "superinfection." Symptoms of such a bacterial infection may include:  Worsening sore throat with pus and difficulty swallowing.   Swollen neck glands.   Chills and a high or persistent fever.   Severe headache.   Tenderness over the sinuses.   Persistent overall ill feeling (malaise), muscle aches, and tiredness (fatigue).   Persistent cough.   Yellow, green, or brown mucus production with coughing.  HOME CARE INSTRUCTIONS   Only take over-the-counter or prescription medicines for pain, discomfort, diarrhea, or fever as directed by your caregiver.   Drink enough water and fluids to keep your urine clear or pale yellow. Sports drinks can provide valuable electrolytes, sugars, and hydration.   Get plenty of rest and maintain proper nutrition. Soups and broths with crackers or rice are fine.  SEEK IMMEDIATE MEDICAL CARE IF:   You have severe headaches, shortness of breath, chest pain, neck pain, or an unusual rash.   You have uncontrolled vomiting, diarrhea, or you are unable to keep down fluids.   You or your child has an oral temperature above 102 F (38.9 C), not controlled by medicine.   Your baby is older than 3 months with a rectal temperature of 102 F (38.9 C) or higher.   Your baby is 3 months old or younger with a rectal temperature of 100.4 F (38 C) or higher.  MAKE SURE YOU:   Understand these instructions.   Will watch your condition.   Will get help right away if you are not doing well or get worse.  Document Released: 10/28/2004 Document Revised: 01/07/2011 Document Reviewed: 05/25/2010 Exit[employer] Patient Information 2012 [employer]. 

## 2011-06-09 NOTE — ED Notes (Signed)
Fever, sore throat, bilat ear aches, cough, generalized aching started yesterday

## 2011-06-11 ENCOUNTER — Encounter: Payer: Self-pay | Admitting: Family Medicine

## 2011-06-11 ENCOUNTER — Ambulatory Visit (INDEPENDENT_AMBULATORY_CARE_PROVIDER_SITE_OTHER): Payer: Self-pay | Admitting: Family Medicine

## 2011-06-11 VITALS — BP 123/90 | HR 81 | Temp 98.8°F | Ht 65.0 in | Wt 176.9 lb

## 2011-06-11 DIAGNOSIS — J029 Acute pharyngitis, unspecified: Secondary | ICD-10-CM

## 2011-06-11 DIAGNOSIS — J069 Acute upper respiratory infection, unspecified: Secondary | ICD-10-CM

## 2011-06-11 MED ORDER — GUAIFENESIN-CODEINE 100-10 MG/5ML PO SYRP: 5.0000 mL | ORAL_SOLUTION | Freq: Three times a day (TID) | ORAL | Status: AC | PRN

## 2011-06-11 MED ORDER — PSEUDOEPHEDRINE HCL 60 MG PO TABS: 60.0000 mg | ORAL_TABLET | ORAL | Status: AC | PRN

## 2011-06-11 NOTE — Progress Notes (Signed)
  Subjective:     Diane Sexton is a 41 y.o. female who presents for evaluation of sore throat. Associated symptoms include nasal blockage, post nasal drip, sinus and nasal congestion, sore throat and ear pain. Onset of symptoms was 2 days ago, and have been gradually worsening since that time. She is drinking plenty of fluids. She has not had a recent close exposure to someone with proven streptococcal pharyngitis. Strep test negative today.  Subjective fever. Alleviating: ibuprofen - didn't help Aggravating: swallowing, talking  Review of Systems Pertinent items are noted in HPI.  NON SMOKER  Objective:   Filed Vitals:   06/11/11 0925  BP: 123/90  Pulse: 81  Temp: 98.8 F (37.1 C)  TempSrc: Oral  Height: 5\' 5"  (1.651 m)  Weight: 176 lb 14.4 oz (80.241 kg)  Lungs:  Normal respiratory effort, chest expands symmetrically. Lungs are clear to auscultation, no crackles or wheezes. Throat: normal mucosa, no exudate, uvula midline, no redness Nose:  External nasal examination shows no deformity or inflammation. Nasal mucosa are pink and moist without lesions or exudates. No septal dislocation or dislocation.No obstruction to airflow. Eye - Pupils Equal Round Reactive to light, Extraocular movements intact, Fundi without hemorrhage or visible lesions, Conjunctiva without redness or discharge Ears:  External ear exam shows no significant lesions or deformities.  Otoscopic examination reveals clear canals, tympanic membranes are intact bilaterally with some bulging, no retraction, inflammation or discharge. Hearing is grossly normal bilaterally Skin:  Intact without suspicious lesions or rashes Laboratory Strep test done. Results:negative.    Assessment:    Acute pharyngitis, likely  Sinusitis with post nasal drip.    Plan:    Use of decongestant recommended. Follow up as needed.

## 2011-06-11 NOTE — Patient Instructions (Signed)
It was great to see you today!  Schedule an appointment to see me as needed.  Meds ordered this encounter  Medications  . ibuprofen (ADVIL,MOTRIN) 800 MG tablet    Sig: Take 800 mg by mouth every 8 (eight) hours as needed.  Marland Kitchen guaiFENesin-codeine (ROBITUSSIN AC) 100-10 MG/5ML syrup    Sig: Take 5 mLs by mouth 3 (three) times daily as needed for cough.    Dispense:  120 mL    Refill:  0  . pseudoephedrine (SUDAFED) 60 MG tablet    Sig: Take 1 tablet (60 mg total) by mouth every 4 (four) hours as needed for congestion.    Dispense:  30 tablet    Refill:  0   If you start to have thick nasal discharge, if your lungs become involved with shortness of breath, or if you start to have high fevers, please come back to the clinic.

## 2011-06-13 ENCOUNTER — Emergency Department (HOSPITAL_BASED_OUTPATIENT_CLINIC_OR_DEPARTMENT_OTHER)
Admission: EM | Admit: 2011-06-13 | Discharge: 2011-06-13 | Disposition: A | Discharge status: Home / Self Care | Payer: Self-pay | Attending: Emergency Medicine | Admitting: Emergency Medicine

## 2011-06-13 ENCOUNTER — Encounter (HOSPITAL_BASED_OUTPATIENT_CLINIC_OR_DEPARTMENT_OTHER): Payer: Self-pay

## 2011-06-13 DIAGNOSIS — F329 Major depressive disorder, single episode, unspecified: Secondary | ICD-10-CM | POA: Insufficient documentation

## 2011-06-13 DIAGNOSIS — H6690 Otitis media, unspecified, unspecified ear: Secondary | ICD-10-CM

## 2011-06-13 DIAGNOSIS — H9209 Otalgia, unspecified ear: Secondary | ICD-10-CM | POA: Insufficient documentation

## 2011-06-13 DIAGNOSIS — F3289 Other specified depressive episodes: Secondary | ICD-10-CM | POA: Insufficient documentation

## 2011-06-13 DIAGNOSIS — R070 Pain in throat: Secondary | ICD-10-CM | POA: Insufficient documentation

## 2011-06-13 DIAGNOSIS — E785 Hyperlipidemia, unspecified: Secondary | ICD-10-CM | POA: Insufficient documentation

## 2011-06-13 MED ORDER — IBUPROFEN 800 MG PO TABS: 800.0000 mg | ORAL_TABLET | Freq: Once | ORAL | Status: DC

## 2011-06-13 MED ORDER — HYDROCODONE-ACETAMINOPHEN 5-325 MG PO TABS: 2.0000 | ORAL_TABLET | ORAL | Status: AC | PRN

## 2011-06-13 MED ORDER — AMOXICILLIN 500 MG PO CAPS: 500.0000 mg | ORAL_CAPSULE | Freq: Three times a day (TID) | ORAL | Status: AC

## 2011-06-13 MED ORDER — IBUPROFEN 800 MG PO TABS
800.0000 mg | ORAL_TABLET | Freq: Once | ORAL | Status: AC
  Administered 2011-06-13: 800 mg via ORAL

## 2011-06-13 MED ORDER — IBUPROFEN 800 MG PO TABS
ORAL_TABLET | ORAL | Status: AC
  Administered 2011-06-13: 800 mg via ORAL
  Filled 2011-06-13: qty 1

## 2011-06-13 NOTE — ED Provider Notes (Signed)
History     CSN: [id number]  Arrival date & time 06/13/11  1154   First MD Initiated Contact with Patient 06/13/11 1215      Chief Complaint  Patient presents with  . Otalgia    (Consider location/radiation/quality/duration/timing/severity/associated sxs/prior treatment) Patient is a 41 y.o. female presenting with ear pain. The history is provided by the patient. No language interpreter was used.  Otalgia This is a new problem. The current episode started yesterday. There is pain in the left ear. The problem occurs constantly. The problem has been gradually worsening. There has been no fever. The pain is moderate. Associated symptoms include sore throat. Her past medical history does not include chronic ear infection.  Pt reports she recently has had a viral illness.   Pt reports she now has pain in left ear  Past Medical History  Diagnosis Date  . Depression     mood swings  . Hyperlipemia     Past Surgical History  Procedure Date  . Appendectomy 2009  . [place] 2008    Family History  Problem Relation Age of Onset  . Hypertension Mother   . Heart attack Mother 48    light MI  . Hypertension Father   . Diabetes Father     History  Substance Use Topics  . Smoking status: Never Smoker   . Smokeless tobacco: Never Used  . Alcohol Use: Yes     occasionally    OB History    Grav Para Term Preterm Abortions TAB SAB Ect Mult Living   3    3 3     0      Review of Systems  HENT: Positive for ear pain and sore throat.   All other systems reviewed and are negative.    Allergies  Oxycodone  Home Medications   Current Outpatient Rx  Name Route Sig Dispense Refill  . BENZONATATE 100 MG PO CAPS Oral Take 1 capsule (100 mg total) by mouth every 8 (eight) hours. 21 capsule 0  . GUAIFENESIN-CODEINE 100-10 MG/5ML PO SYRP Oral Take 5 mLs by mouth 3 (three) times daily as needed for cough. 120 mL 0  . IBUPROFEN 800 MG PO TABS Oral Take 800 mg by mouth every 8 (eight)  hours as needed.    Marland Kitchen LITHIUM CARBONATE 150 MG PO CAPS Oral Take 1 capsule (150 mg total) by mouth 2 (two) times daily with a meal. Take one capsule daily for 7 days, then increase to two capsules daily. 60 capsule 1  . PSEUDOEPHEDRINE HCL 60 MG PO TABS Oral Take 1 tablet (60 mg total) by mouth every 4 (four) hours as needed for congestion. 30 tablet 0    BP 139/98  Pulse 80  Temp(Src) 98.7 F (37.1 C) (Oral)  Resp 20  Ht 5\' 5"  (1.651 m)  Wt 170 lb (77.111 kg)  BMI 28.29 kg/m2  SpO2 98%  LMP 05/27/2011  Physical Exam  Nursing note and vitals reviewed. Constitutional: She appears well-developed and well-nourished.  HENT:  Head: Normocephalic and atraumatic.  Right Ear: External ear normal.  Nose: Nose normal.  Mouth/Throat: Oropharynx is clear and moist.       Left tm erythematous  Eyes: Conjunctivae and EOM are normal. Pupils are equal, round, and reactive to light.  Neck: Normal range of motion. Neck supple.  Cardiovascular: Normal rate.   Pulmonary/Chest: Effort normal.  Abdominal: Soft.  Musculoskeletal: Normal range of motion.  Neurological: She is alert.  Skin: Skin is warm.  Psychiatric: She has a normal mood and affect.    ED Course  Procedures (including critical care time)  Labs Reviewed - No data to display No results found.   No diagnosis found.    MDM  Amox   500 Hydrocodone         Lonia Skinner [place], [place] 06/13/11 1234

## 2011-06-13 NOTE — Discharge Instructions (Signed)
Otitis Media, Adult  A middle ear infection is an infection in the space behind the eardrum. The medical name for this is "otitis media." It may happen after a common cold. It is caused by a germ that starts growing in that space. You may feel swollen glands in your neck on the side of the ear infection.  HOME CARE INSTRUCTIONS   · Take your medicine as directed until it is gone, even if you feel better after the first few days.  · Only take over-the-counter or prescription medicines for pain, discomfort, or fever as directed by your caregiver.  · Occasional use of a nasal decongestant a couple times per day may help with discomfort and help the eustachian tube to drain better.  Follow up with your caregiver in 10 to 14 days or as directed, to be certain that the infection has cleared. Not keeping the appointment could result in a chronic or permanent injury, pain, hearing loss and disability. If there is any problem keeping the appointment, you must call back to this facility for assistance.  SEEK IMMEDIATE MEDICAL CARE IF:   · You are not getting better in 2 to 3 days.  · You have pain that is not controlled with medication.  · You feel worse instead of better.  · You cannot use the medication as directed.  · You develop swelling, redness or pain around the ear or stiffness in your neck.  MAKE SURE YOU:   · Understand these instructions.  · Will watch your condition.  · Will get help right away if you are not doing well or get worse.  Document Released: 10/24/2003 Document Revised: 01/07/2011 Document Reviewed: 08/25/2007  ExitCare® Patient Information ©2012 [employer].

## 2011-06-13 NOTE — ED Provider Notes (Signed)
Medical screening examination/treatment/procedure(s) were performed by non-physician practitioner and as supervising physician I was immediately available for consultation/collaboration.   Dayon Witt, MD 06/13/11 1612 

## 2011-06-13 NOTE — ED Notes (Signed)
Pt states that she was dx with viral URI, pt states that she has severe ear pain at this time, c/o severe earache and pressure on L ear.

## 2011-06-14 NOTE — ED Provider Notes (Signed)
Medical screening examination/treatment/procedure(s) were conducted as a shared visit with non-physician practitioner(s) and myself.  I personally evaluated the patient during the encounter.  I saw the patient along with Lonna Cobb and agree with her note, assessment, and plan.  The patient presents with complaints of cough, congestion, sore throat for the past several days.  On exam, vitals are stable, she is afebrile, and oxygen sats are adequate.  She is in no distress.  The ENT, heart and lung exams are all unremarkable.  She was originally seen by the midlevel but was apparently not happy with what she told her.  I agree with Vrinda's assessment of a viral uri and do not believe that further workup or antibiotics are indicated.  She will be discharged to home with otc medications.  Geoffery Lyons, MD 06/14/11 [phone]

## 2011-06-16 ENCOUNTER — Ambulatory Visit: Payer: Self-pay | Admitting: Family Medicine

## 2011-06-23 ENCOUNTER — Ambulatory Visit (INDEPENDENT_AMBULATORY_CARE_PROVIDER_SITE_OTHER): Payer: Self-pay | Admitting: Family Medicine

## 2011-06-23 ENCOUNTER — Encounter: Payer: Self-pay | Admitting: Family Medicine

## 2011-06-23 VITALS — BP 130/86 | HR 71 | Temp 98.9°F | Ht 65.0 in | Wt 177.0 lb

## 2011-06-23 DIAGNOSIS — H6690 Otitis media, unspecified, unspecified ear: Secondary | ICD-10-CM

## 2011-06-23 DIAGNOSIS — R05 Cough: Secondary | ICD-10-CM

## 2011-06-23 MED ORDER — ANTIPYRINE-BENZOCAINE 5.4-1.4 % OT SOLN: 3.0000 [drp] | Freq: Four times a day (QID) | OTIC | Status: AC | PRN

## 2011-06-23 NOTE — Progress Notes (Signed)
  Subjective:    Patient ID: Diane Sexton, female    DOB: 17-May-1970, 41 y.o.   MRN: [id number]  HPI Left ear pain and cough: Patient reports viral syndrome that started 3 weeks ago. Initially started with chills, fever, cough, sore throat. Seen in the ER at start of symptoms-diagnosed with viral URI. Did not seem to be improving. Came into the office for recheck-diagnosed also with viral URI. A week later with 2 the ER again for left ear pain. Diagnosed with left ear infection at that time. Started on amoxicillin. All symptoms have improved. But continues to have some left ear sensitivity and throbbing. Also has decreased hearing in left ear. In continues to have dry cough. No nausea. No vomiting. No rash. No fever. No wheezing. No shortness of breath.  Smoking status reviewed.   Review of Systems As per above.    Objective:   Physical Exam  Constitutional: She appears well-developed and well-nourished.  HENT:  Head: Normocephalic and atraumatic.  Nose: Nose normal.  Mouth/Throat: No oropharyngeal exudate.       Hearing grossly normal.  TM intact bilateral, normal tm on right.  Left TM with mild erythema and + fluid behind TM.   Eyes: Pupils are equal, round, and reactive to light. Right eye exhibits no discharge. Left eye exhibits no discharge.  Neck: Normal range of motion.  Cardiovascular: Normal rate, regular rhythm and normal heart sounds.   No murmur heard. Pulmonary/Chest: Effort normal and breath sounds normal. No respiratory distress. She has no wheezes. She has no rales.  Abdominal: Soft. She exhibits no distension. There is no tenderness.  Musculoskeletal: She exhibits no edema.  Neurological: She is alert.  Skin: No rash noted.  Psychiatric: She has a normal mood and affect.          Assessment & Plan:

## 2011-06-24 ENCOUNTER — Ambulatory Visit (HOSPITAL_COMMUNITY): Payer: Self-pay | Admitting: Psychiatry

## 2011-06-24 DIAGNOSIS — R05 Cough: Secondary | ICD-10-CM | POA: Insufficient documentation

## 2011-06-24 DIAGNOSIS — H9202 Otalgia, left ear: Secondary | ICD-10-CM | POA: Insufficient documentation

## 2011-06-24 NOTE — Assessment & Plan Note (Addendum)
Some fluid present behind left tm with mild erythema.  Improved now s/p antibiotics.  Persistent decrease in hearing in left ear most likely due to presence of fluid.  Encouraged watchful waiting for the next 2-3 weeks to give time for ear to completely heal and fluid to resolve.  If hearing continues to be altered in the left ear in 2-3 weeks from now, or if new or worsening of symptoms- pt to return.  Could, at that time, consider referral to ENT.  For ear discomfort/sensitivity/irritation, pt requesting something for pain relief.  Pt to try auralgan otic drops.

## 2011-06-24 NOTE — Assessment & Plan Note (Signed)
Symptoms improved.  Persistent dry cough, slowly improving.  Exam and history consistent with post- viral cough.  Discussed in detail with patient.  Reviewed red flags for return.  Pt to Return if no improvement or if new or worsening of symptoms.

## 2011-06-30 ENCOUNTER — Ambulatory Visit (HOSPITAL_COMMUNITY): Payer: Self-pay | Admitting: Psychiatry

## 2011-07-05 ENCOUNTER — Ambulatory Visit (INDEPENDENT_AMBULATORY_CARE_PROVIDER_SITE_OTHER): Payer: Self-pay | Admitting: Family Medicine

## 2011-07-05 ENCOUNTER — Ambulatory Visit (HOSPITAL_COMMUNITY)
Admission: RE | Admit: 2011-07-05 | Discharge: 2011-07-05 | Disposition: A | Discharge status: Home / Self Care | Payer: Self-pay | Source: Ambulatory Visit | Attending: Family Medicine | Admitting: Family Medicine

## 2011-07-05 ENCOUNTER — Encounter: Payer: Self-pay | Admitting: Family Medicine

## 2011-07-05 VITALS — BP 137/93 | HR 99 | Temp 98.5°F | Ht 65.5 in | Wt 178.0 lb

## 2011-07-05 DIAGNOSIS — H9209 Otalgia, unspecified ear: Secondary | ICD-10-CM

## 2011-07-05 DIAGNOSIS — H9202 Otalgia, left ear: Secondary | ICD-10-CM

## 2011-07-05 DIAGNOSIS — H6690 Otitis media, unspecified, unspecified ear: Secondary | ICD-10-CM

## 2011-07-05 DIAGNOSIS — R058 Other specified cough: Secondary | ICD-10-CM

## 2011-07-05 DIAGNOSIS — R0789 Other chest pain: Secondary | ICD-10-CM

## 2011-07-05 DIAGNOSIS — R079 Chest pain, unspecified: Secondary | ICD-10-CM | POA: Insufficient documentation

## 2011-07-05 DIAGNOSIS — R05 Cough: Secondary | ICD-10-CM

## 2011-07-05 DIAGNOSIS — R059 Cough, unspecified: Secondary | ICD-10-CM

## 2011-07-05 MED ORDER — HYDROCODONE-HOMATROPINE 5-1.5 MG/5ML PO SYRP: 2.5000 mL | ORAL_SOLUTION | ORAL | Status: AC | PRN

## 2011-07-05 NOTE — Assessment & Plan Note (Signed)
Persistent. Patient told this would take several weeks to resolve, and possibly months. She was instructed to take Robitussin-DM as first line treatment, but also given a Rx for Hycodan syrup in case she needed it.

## 2011-07-05 NOTE — Assessment & Plan Note (Signed)
[place] 10 year risk of CHD is 1%. Based upon story and EKG, there is no evidence of ACS. Likely musculoskeletal from coughing. Told to use ibuprofen. Case discussed with Dr. Deirdre Priest, who agreed that work-up for ACS was not indicated. Follow-up with PCP.

## 2011-07-05 NOTE — Assessment & Plan Note (Addendum)
Patient is concerned about persistent pain; No evidence of infection today and s/p amoxicllin, but likely persistent effusion is causing otalgia. Patient would like referral, and I explained that this is likely not something which needs any intervention, but was agreeable to it if she still wanted it. She still requested referral, so I will make one.

## 2011-07-05 NOTE — Progress Notes (Signed)
  Subjective:    Patient ID: Diane Sexton, female    DOB: 19-Mar-1970, 41 y.o.   MRN: [id number]  HPI Patient presented with myriad complaints. Only 3 addressed.   Chest Pain and Pressure - pressure 5/10, left side, radiating to her left upper arm; started this AM; not worsened by walking or movement; worsened by lying down; denies hx of chest pain (but noted several times in her chart); not associated with SOB, nausea, vomiting, diaphoresis, anxiety, injury or trauma to the area; hx of grandmother with MI in 30's otherwise negative family hx; personal risk factors include only hyperlipidemia   Otalgia - left sided, 1 month duration, exacerbated by head movement, feels like there is fluid behind her ear, has not improved since viral infection; no decreased hearing; would like referral to ENT as she was told if it was persistent then she would be referred  Persistent Cough - duration 1 month, coinciding with viral URI, very bothersome, currently dry but previously productive, occurs at all time, has not tried OTC cough medicine   Review of Systems Negative unless stated in HPI    Objective:   Physical Exam BP 137/93  Pulse 99  Temp(Src) 98.5 F (36.9 C) (Oral)  Ht 5' 5.5" (1.664 m)  Wt 178 lb (80.74 kg)  BMI 29.17 kg/m2 Gen: middle age female, non distressed, very talkative and pleasant HEENT: OP red but no tonsillar exudate; no lymphadenopathy; right ear pristine; left ear with erythema of TM Chest: non-tender to palpation along chest wall Cardiac: RRR, no murmurs, pain relieved with lying down on exam table  EKG: sinus rhythm, low voltage, unchanged from previous, no evidence of ischemia        Assessment & Plan:  41 year old female with chest pain and pressure that has no evidence of being ACS as well as multiple post-viral illness sequelae.

## 2011-07-05 NOTE — Patient Instructions (Signed)
Dear Mrs. Diane Sexton,   Thank you for coming to clinic today. Please read below regarding the issues that we discussed.   1. Chest Pressure - There is no indication that this is your heart based on the story and the EKG. It is most likely secondary to coughing or other inflammation from your virus. I recommend taking ibuprofen 400 mg 4 times a day for 5 days. Be sure to take it with food, as it can upset your stomach.   2. Cough - This is likely post-viral. I will prescribed you cough syrup today if you would like, but the cough will persist for weeks. I would use robitussin-DM, which is over the counter and may be cheater .  3. Ear Pain - This is likely secondary to fluid behind your ear. This can last for months after viruses. It should get better with ibuprofen. If you would like referral to the ENT, then I am happy to provide one.   Please follow up in clinic in as needed . Please call earlier if you have any questions or concerns.   Sincerely,   Dr. Clinton Sawyer

## 2011-07-07 ENCOUNTER — Telehealth: Payer: Self-pay | Admitting: Family Medicine

## 2011-07-07 NOTE — Telephone Encounter (Signed)
[facility] to schedule patient an appointment and was told Janiylah would need to call [phone] before they would schedule her an appointment.  Information relayed to [facility].  If I don't hear back from her, we will continue to process her referral through [facility].   Ileana Ladd

## 2011-07-07 NOTE — Telephone Encounter (Signed)
Patient called back because she had a balance that she is not able to pay through [facility] so she would like her referral to go through [facility] [facility].

## 2011-07-07 NOTE — Telephone Encounter (Signed)
Patient called to check status of referral for ENT.  After speaking to Lupita Leash, I explained that due to having the [facility] her referral would have to go through [facility] [facility] which could take weeks or months unless she is able to pay out of pocket.  Patient is able to able to pay for ENT out of pocket so she would like to move forward with going to an ENT as soon as possible.

## 2011-07-12 ENCOUNTER — Telehealth: Payer: Self-pay | Admitting: *Deleted

## 2011-07-12 NOTE — Telephone Encounter (Signed)
Received fax today from [employer].  They are unable to fill this referral due to balance patient owes at [facility].  Ileana Ladd

## 2011-07-26 ENCOUNTER — Ambulatory Visit (INDEPENDENT_AMBULATORY_CARE_PROVIDER_SITE_OTHER): Payer: Self-pay | Admitting: Obstetrics & Gynecology

## 2011-07-26 ENCOUNTER — Encounter: Payer: Self-pay | Admitting: Obstetrics & Gynecology

## 2011-07-26 VITALS — BP 139/95 | HR 81 | Temp 98.2°F | Ht 66.0 in | Wt 179.6 lb

## 2011-07-26 DIAGNOSIS — N938 Other specified abnormal uterine and vaginal bleeding: Secondary | ICD-10-CM

## 2011-07-26 DIAGNOSIS — D219 Benign neoplasm of connective and other soft tissue, unspecified: Secondary | ICD-10-CM

## 2011-07-26 DIAGNOSIS — IMO0002 Reserved for concepts with insufficient information to code with codable children: Secondary | ICD-10-CM | POA: Insufficient documentation

## 2011-07-26 DIAGNOSIS — N949 Unspecified condition associated with female genital organs and menstrual cycle: Secondary | ICD-10-CM

## 2011-07-26 DIAGNOSIS — R87612 Low grade squamous intraepithelial lesion on cytologic smear of cervix (LGSIL): Secondary | ICD-10-CM

## 2011-07-26 DIAGNOSIS — D259 Leiomyoma of uterus, unspecified: Secondary | ICD-10-CM

## 2011-07-26 LAB — TSH: TSH: 4.055 u[IU]/mL (ref 0.350–4.500)

## 2011-07-26 NOTE — Addendum Note (Signed)
Addended by: Allie Bossier on: 07/26/2011 04:11 PM   Modules accepted: Orders

## 2011-07-26 NOTE — Progress Notes (Signed)
  Subjective:    Patient ID: Diane Sexton, female    DOB: 23-Aug-1970, 41 y.o.   MRN: [id number]  HPI  She is here because her periods have changed, now longer lasting 5 days and heavier. She had her [place] in 2007. She describes the blood as "bright red, thin, mucusy"  Review of Systems Pap normal 1//13 She has some dysparunia, positional.    Objective:   Physical Exam  Pap smear obtained  14 week uterus, non-palpable adnexa      Assessment & Plan:  Worsening dysmenorrhea- history of fibroid, [place] Check U/S, cultures, TSH

## 2011-07-26 NOTE — Addendum Note (Signed)
Addended by: Gerome Apley on: 07/26/2011 04:29 PM   Modules accepted: Orders

## 2011-07-29 ENCOUNTER — Ambulatory Visit (HOSPITAL_COMMUNITY)
Admission: RE | Admit: 2011-07-29 | Discharge: 2011-07-29 | Disposition: A | Discharge status: Home / Self Care | Payer: Self-pay | Source: Ambulatory Visit | Attending: Obstetrics & Gynecology | Admitting: Obstetrics & Gynecology

## 2011-07-29 DIAGNOSIS — N938 Other specified abnormal uterine and vaginal bleeding: Secondary | ICD-10-CM | POA: Insufficient documentation

## 2011-07-29 DIAGNOSIS — N949 Unspecified condition associated with female genital organs and menstrual cycle: Secondary | ICD-10-CM | POA: Insufficient documentation

## 2011-07-29 DIAGNOSIS — IMO0002 Reserved for concepts with insufficient information to code with codable children: Secondary | ICD-10-CM

## 2011-07-29 DIAGNOSIS — D219 Benign neoplasm of connective and other soft tissue, unspecified: Secondary | ICD-10-CM

## 2011-07-29 DIAGNOSIS — D259 Leiomyoma of uterus, unspecified: Secondary | ICD-10-CM | POA: Insufficient documentation

## 2011-08-02 ENCOUNTER — Telehealth: Payer: Self-pay

## 2011-08-02 NOTE — Telephone Encounter (Signed)
Called pt and informed pt of abnormal results and that according to her [place] per Dr. Jolayne Panther her fibroids has not changed since 02/2010.  Per Dr. Marice Potter pt is to continue to have repeat paps every six months to monitor for change.  Dr.  Marice Potter spoke with the pt and informed her of this info.  Pt stated understanding and had no further questions.

## 2011-08-02 NOTE — Telephone Encounter (Signed)
Pt called and was wanting test results of [place] and pap smear.

## 2011-08-25 ENCOUNTER — Encounter: Payer: Self-pay | Admitting: Physician Assistant

## 2011-08-25 ENCOUNTER — Ambulatory Visit (INDEPENDENT_AMBULATORY_CARE_PROVIDER_SITE_OTHER): Payer: Self-pay | Admitting: Physician Assistant

## 2011-08-25 VITALS — BP 127/80 | HR 78 | Temp 96.9°F | Ht 65.5 in | Wt 173.5 lb

## 2011-08-25 DIAGNOSIS — IMO0002 Reserved for concepts with insufficient information to code with codable children: Secondary | ICD-10-CM

## 2011-08-25 DIAGNOSIS — R87612 Low grade squamous intraepithelial lesion on cytologic smear of cervix (LGSIL): Secondary | ICD-10-CM

## 2011-08-25 DIAGNOSIS — D259 Leiomyoma of uterus, unspecified: Secondary | ICD-10-CM

## 2011-08-25 NOTE — Patient Instructions (Addendum)
Hysterectomy Information  A hysterectomy is a procedure where your uterus is surgically removed. It will no longer be possible to have menstrual periods or to become pregnant. The tubes and ovaries can be removed (bilateral salpingo-oopherectomy) during this surgery as well.  REASONS FOR A HYSTERECTOMY  Persistent, abnormal bleeding.   Lasting (chronic) pelvic pain or infection.   The lining of the uterus (endometrium) starts growing outside the uterus (endometriosis).   The endometrium starts growing in the muscle of the uterus (adenomyosis).   The uterus falls down into the vagina (pelvic organ prolapse).   Symptomatic uterine fibroids.   Precancerous cells.   Cervical cancer or uterine cancer.  TYPES OF HYSTERECTOMIES  Supracervical hysterectomy. This type removes the top part of the uterus, but not the cervix.   Total hysterectomy. This type removes the uterus and cervix.   Radical hysterectomy. This type removes the uterus, cervix, and the fibrous tissue that holds the uterus in place in the pelvis (parametrium).  WAYS A HYSTERECTOMY CAN BE PERFORMED  Abdominal hysterectomy. A large surgical cut (incision) is made in the abdomen. The uterus is removed through this incision.   Vaginal hysterectomy. An incision is made in the vagina. The uterus is removed through this incision. There are no abdominal incisions.   Conventional laparoscopic hysterectomy. A thin, lighted tube with a camera (laparoscope) is inserted into 3 or 4 small incisions in the abdomen. The uterus is cut into small pieces. The small pieces are removed through the incisions, or they are removed through the vagina.   Laparoscopic assisted vaginal hysterectomy (LAVH). Three or four small incisions are made in the abdomen. Part of the surgery is performed laparoscopically and part vaginally. The uterus is removed through the vagina.   Robot-assisted laparoscopic hysterectomy. A laparoscope is inserted into 3 or 4  small incisions in the abdomen. A computer-controlled device is used to give the surgeon a 3D image. This allows for more precise movements of surgical instruments. The uterus is cut into small pieces and removed through the incisions or removed through the vagina.  RISKS OF HYSTERECTOMY   Bleeding and risk of blood transfusion. Tell your caregiver if you do not want to receive any blood products.   Blood clots in the legs or lung.   Infection.   Injury to surrounding organs.   Anesthesia problems or side effects.   Conversion to an abdominal hysterectomy.  WHAT TO EXPECT AFTER A HYSTERECTOMY  You will be given pain medicine.   You will need to have someone with you for the first 3 to 5 days after you go home.   You will need to follow up with your surgeon in 2 to 4 weeks after surgery to evaluate your progress.   You may have early menopause symptoms like hot flashes, night sweats, and insomnia.   If you had a hysterectomy for a problem that was not a cancer or a condition that could lead to cancer, then you no longer need Pap tests. However, even if you no longer need a Pap test, a regular exam is a good idea to make sure no other problems are starting.  Document Released: 07/14/2000 Document Revised: 01/07/2011 Document Reviewed: 08/29/2010 Exit[employer] Patient Information 2012 [employer]. 

## 2011-08-25 NOTE — Progress Notes (Signed)
Chief Complaint:  Follow-up   Diane Sexton is  41 y.o. G3P0030.  Patient's last menstrual period was 08/19/2011..  She presents complaining of Follow-up  Pt presents for further discussion of previous visits test results. Also, pt is desiring to scheduled hysterectomy for permanent treatment. States after [place] on 2 days of spotting monthly. Now having 4 days of light bleeding requiring her to change pad 3 times daily.  Obstetrical/Gynecological History: OB History    Grav Para Term Preterm Abortions TAB SAB Ect Mult Living   3    3 3     0      Past Medical History: Past Medical History  Diagnosis Date  . Depression     mood swings  . Hyperlipemia     Past Surgical History: Past Surgical History  Procedure Date  . Appendectomy 2009  . [place] 2008    Family History: Family History  Problem Relation Age of Onset  . Hypertension Mother   . Heart attack Mother 48    light MI  . Hypertension Father   . Diabetes Father     Social History: History  Substance Use Topics  . Smoking status: Never Smoker   . Smokeless tobacco: Never Used  . Alcohol Use: Yes     occasionally    Allergies:  Allergies  Allergen Reactions  . Oxycodone Other (See Comments)    Upset stomach     (Not in a hospital admission)  Review of Systems - Negative except what has been reviewed in HPI  Physical Exam   Blood pressure 127/80, pulse 78, temperature 96.9 F (36.1 C), temperature source Oral, height 5' 5.5" (1.664 m), weight 173 lb 8 oz (78.699 kg), last menstrual period 08/19/2011.  General: General appearance - alert, well appearing, and in no distress and oriented to person, place, and time Mental status - alert, oriented to person, place, and time, normal mood, behavior, speech, dress, motor activity, and thought processes, affect appropriate to mood Focused Gynecological Exam: examination not indicated  Labs: Diagnosis LOW GRADE SQUAMOUS INTRAEPITHELIAL LESION: CIN-1/ HPV  (LSIL). ORGANISM(S) FUNGAL ORGANISMS PRESENT CONSISTENT WITH CANDIDA. RECOMMENDATION  TSH: 4.05  Imaging Studies:  US Transvaginal Non-ob  07/29/2011  *RADIOLOGY REPORT*  Clinical Data: Fibroids.  LMP 07/22/2011  TRANSABDOMINAL AND TRANSVAGINAL ULTRASOUND OF PELVIS Technique:  Both transabdominal and transvaginal ultrasound examinations of the pelvis were performed. Transabdominal technique was performed for global imaging of the pelvis including uterus, ovaries, adnexal regions, and pelvic cul-de-sac.  It was necessary to proceed with endovaginal exam following the transabdominal exam to visualize the endometrium, myometrium and adnexa.  Comparison:  02/12/2010  Findings:  Uterus: The uterus is anteverted and anteflexed and demonstrates a sagittal length of 9.8 cm, depth of 4.7 cm and width of 7.7 cm. Multiple fibroids are identified with the largest located in the fundus measuring 4 x 4.2 x 6.0 cm on the left with a predominant subserosal location.  Smaller fibroids are seen in the posterior upper uterine segment measuring 1.7 x 1.4 x 1.9 cm, in the posterior fundal region measuring 1.1 x 0.9 x 1.0 cm, in the right upper uterine segment measuring 2.3 x 1.7 x 1.5 cm and in the fundal region measuring 1.3 x 1.4 x 1.2 cm.  Several of the fibroids demonstrate calcification.  Endometrium: Appears homogeneously echogenic with a width of 6.2 mm.  No areas of focal thickening or heterogeneity are seen and no definite submucosal component is noted associated with any of the fibroids.  Right ovary:  The right ovary measures 2.2 x 1.9 x 2.0 cm and has a normal appearance  Left ovary: The left ovary measures 3.1 x 1.5 x 2.6 cm and has a normal appearance  Other findings: No pelvic fluid or separate adnexal masses are seen.  IMPRESSION: Fibroid uterus with fibroid sizes and locations as noted above. Little interval change is noted in comparison with the prior exam.  Normal endometrial lining and ovaries.  Original  Report Authenticated By: Bertha Stakes, M.D.   US Pelvis Complete  07/29/2011  *RADIOLOGY REPORT*  Clinical Data: Fibroids.  LMP 07/22/2011  TRANSABDOMINAL AND TRANSVAGINAL ULTRASOUND OF PELVIS Technique:  Both transabdominal and transvaginal ultrasound examinations of the pelvis were performed. Transabdominal technique was performed for global imaging of the pelvis including uterus, ovaries, adnexal regions, and pelvic cul-de-sac.  It was necessary to proceed with endovaginal exam following the transabdominal exam to visualize the endometrium, myometrium and adnexa.  Comparison:  02/12/2010  Findings:  Uterus: The uterus is anteverted and anteflexed and demonstrates a sagittal length of 9.8 cm, depth of 4.7 cm and width of 7.7 cm. Multiple fibroids are identified with the largest located in the fundus measuring 4 x 4.2 x 6.0 cm on the left with a predominant subserosal location.  Smaller fibroids are seen in the posterior upper uterine segment measuring 1.7 x 1.4 x 1.9 cm, in the posterior fundal region measuring 1.1 x 0.9 x 1.0 cm, in the right upper uterine segment measuring 2.3 x 1.7 x 1.5 cm and in the fundal region measuring 1.3 x 1.4 x 1.2 cm.  Several of the fibroids demonstrate calcification.  Endometrium: Appears homogeneously echogenic with a width of 6.2 mm.  No areas of focal thickening or heterogeneity are seen and no definite submucosal component is noted associated with any of the fibroids.  Right ovary:  The right ovary measures 2.2 x 1.9 x 2.0 cm and has a normal appearance  Left ovary: The left ovary measures 3.1 x 1.5 x 2.6 cm and has a normal appearance  Other findings: No pelvic fluid or separate adnexal masses are seen.  IMPRESSION: Fibroid uterus with fibroid sizes and locations as noted above. Little interval change is noted in comparison with the prior exam.  Normal endometrial lining and ovaries.  Original Report Authenticated By: Bertha Stakes, M.D.      Assessment: Uterine Fibroids: stable Chronic LSIL pap: stable  Plan: Reviewed stable results with patient. States that she desires hysterectomy. Discussed risk/benefit of expectant management vs surgical management given light bleeding at this time. Pt desires surgical consult with Dr. Marice Potter. Will schedule next available with [facility] for further discussion.   Ashtian Villacis E. 08/25/2011,3:37 PM

## 2011-09-09 ENCOUNTER — Encounter: Payer: Self-pay | Admitting: Physician Assistant

## 2011-09-15 ENCOUNTER — Ambulatory Visit (INDEPENDENT_AMBULATORY_CARE_PROVIDER_SITE_OTHER): Payer: Self-pay | Admitting: Family Medicine

## 2011-09-15 ENCOUNTER — Encounter: Payer: Self-pay | Admitting: Family Medicine

## 2011-09-15 VITALS — BP 131/89 | HR 81 | Ht 65.5 in | Wt 176.0 lb

## 2011-09-15 DIAGNOSIS — E669 Obesity, unspecified: Secondary | ICD-10-CM

## 2011-09-15 DIAGNOSIS — M549 Dorsalgia, unspecified: Secondary | ICD-10-CM

## 2011-09-15 DIAGNOSIS — E785 Hyperlipidemia, unspecified: Secondary | ICD-10-CM

## 2011-09-15 MED ORDER — CYCLOBENZAPRINE HCL 10 MG PO TABS: 10.0000 mg | ORAL_TABLET | Freq: Every evening | ORAL | Status: DC | PRN

## 2011-09-15 NOTE — Assessment & Plan Note (Signed)
Pt would prefer to try fish oil (1g), diet exercise for elevated LDL/cholesterol. Since no other medical problems, I feel this is appropriate.  Will recheck FLP in 6 months to see if we have made improvement; goal would be LDL <130 at least.  Pt agreeable that if still elevated at that time, will start statin instead.

## 2011-09-15 NOTE — Assessment & Plan Note (Signed)
Muscular in nature.  Refilled flexeril-- pt reports responsible use.  Encouraged heating pad and NSAID PRN first.  Will refer to PT for stretches/strengthening. No red flags on exam. No need for imaging.

## 2011-09-15 NOTE — Progress Notes (Signed)
S: Pt comes in today for follow up.  HYPERLIPIDEMIA, OBESITY  Meds: NONE Muscle aches: n/a Last FLP or LDL:  Lab Results  Component Value Date   LDLCALC 159* 05/11/2011   Lab Results  Component Value Date   CHOL 239* 05/11/2011   HDL 41 05/11/2011   LDLCALC 562* 05/11/2011   LDLDIRECT 174* 12/24/2009   TRIG 193* 05/11/2011   CHOLHDL 5.8 05/11/2011    Diet: nothing in particular  Exercise: walking 30 min 2x/week on treadmill   Weight:  Wt Readings from Last 3 Encounters:  09/15/11 176 lb (79.833 kg)  08/25/11 173 lb 8 oz (78.699 kg)  07/26/11 179 lb 9.6 oz (81.466 kg)    BACK PAIN Low back pain, especially after sitting for long periods of time. Usually on left side, low back; occasionally will radiate across back.  Feels like a cramp.  Has been present for months.  No known trauma to the area.  Worse after sitting.  Has been using flexeril-- usually will wait a few hours and if still having pain will take one-- on average, about 2x/month usually before bed time.  Has tried ibuprofen, which doesn't seem to help (800mg ).  Has not tried anything else.  Has never tried PT.   No radiation of pain, no numbness/tingling/weakness in legs, no bowel/bladder incontinence.    ROS: Per HPI  History  Smoking status  . Never Smoker   Smokeless tobacco  . Never Used    O:  Filed Vitals:   09/15/11 0901  BP: 131/89  Pulse: 81    Gen: NAD CV: RRR, no murmur Pulm: CTA bilat, no wheezes or crackles Ext: Warm, no edema Back: mild TTP left lumbar paraspinal muscles, no TTP over spine or right side, no obvious deformities    A/P: 41 y.o. female p/w HLD, obesity, low back pain -See problem list -f/u in 6 months

## 2011-09-15 NOTE — Patient Instructions (Addendum)
It was great to see you today!  We decided to start fish oil (1 gram) along with diet and exercise to try to treat your high cholesterol.  We will recheck it in about 6 months to see how we are doing.  If it's still high then, we will talk about starting a prescription medication.   For your back pain, I will refill your flexeril.  I will also send you to physical therapy for some stretching/strengthening exercises.  Use a heating pad for 30 minutes 3-4 times a day when you're having this pain.  Continue to use the ibuprofen first, before using the flexeril.  Come back if your back pain gets worse, or in about 6 months and we'll recheck your cholesterol so come fasting.     Low Back Sprain with Rehab  A sprain is an injury in which a ligament is torn. The ligaments of the lower back are vulnerable to sprains. However, they are strong and require great force to be injured. These ligaments are important for stabilizing the spinal column. Sprains are classified into three categories. Grade 1 sprains cause pain, but the tendon is not lengthened. Grade 2 sprains include a lengthened ligament, due to the ligament being stretched or partially ruptured. With grade 2 sprains there is still function, although the function may be decreased. Grade 3 sprains involve a complete tear of the tendon or muscle, and function is usually impaired. SYMPTOMS   Severe pain in the lower back.   Sometimes, a feeling of a "pop," "snap," or tear, at the time of injury.   Tenderness and sometimes swelling at the injury site.   Uncommonly, bruising (contusion) within 48 hours of injury.   Muscle spasms in the back.  CAUSES  Low back sprains occur when a force is placed on the ligaments that is greater than they can handle. Common causes of injury include:  Performing a stressful act while off-balance.   Repetitive stressful activities that involve movement of the lower back.   Direct hit (trauma) to the lower back.    RISK INCREASES WITH:  Contact sports (football, wrestling).   Collisions (major skiing accidents).   Sports that require throwing or lifting (baseball, weightlifting).   Sports involving twisting of the spine (gymnastics, diving, tennis, golf).   Poor strength and flexibility.   Inadequate protection.   Previous back injury or surgery (especially fusion).  PREVENTION  Wear properly fitted and padded protective equipment.   Warm up and stretch properly before activity.   Allow for adequate recovery between workouts.   Maintain physical fitness:   Strength, flexibility, and endurance.   Cardiovascular fitness.   Maintain a healthy body weight.  PROGNOSIS  If treated properly, low back sprains usually heal with non-surgical treatment. The length of time for healing depends on the severity of the injury.  RELATED COMPLICATIONS   Recurring symptoms, resulting in a chronic problem.   Chronic inflammation and pain in the low back.   Delayed healing or resolution of symptoms, especially if activity is resumed too soon.   Prolonged impairment.   Unstable or arthritic joints of the low back.  TREATMENT  Treatment first involves the use of ice and medicine, to reduce pain and inflammation. The use of strengthening and stretching exercises may help reduce pain with activity. These exercises may be performed at home or with a therapist. Severe injuries may require referral to a therapist for further evaluation and treatment, such as ultrasound. Your caregiver may advise that  you wear a back brace or corset, to help reduce pain and discomfort. Often, prolonged bed rest results in greater harm then benefit. Corticosteroid injections may be recommended. However, these should be reserved for the most serious cases. It is important to avoid using your back when lifting objects. At night, sleep on your back on a firm mattress, with a pillow placed under your knees. If non-surgical  treatment is unsuccessful, surgery may be needed.  MEDICATION   If pain medicine is needed, nonsteroidal anti-inflammatory medicines (aspirin and ibuprofen), or other minor pain relievers (acetaminophen), are often advised.   Do not take pain medicine for 7 days before surgery.   Prescription pain relievers may be given, if your caregiver thinks they are needed. Use only as directed and only as much as you need.   Ointments applied to the skin may be helpful.   Corticosteroid injections may be given by your caregiver. These injections should be reserved for the most serious cases, because they may only be given a certain number of times.  HEAT AND COLD  Cold treatment (icing) should be applied for 10 to 15 minutes every 2 to 3 hours for inflammation and pain, and immediately after activity that aggravates your symptoms. Use ice packs or an ice massage.   Heat treatment may be used before performing stretching and strengthening activities prescribed by your caregiver, physical therapist, or athletic trainer. Use a heat pack or a warm water soak.  SEEK MEDICAL CARE IF:   Symptoms get worse or do not improve in 2 to 4 weeks, despite treatment.   You develop numbness or weakness in either leg.   You lose bowel or bladder function.   Any of the following occur after surgery: fever, increased pain, swelling, redness, drainage of fluids, or bleeding in the affected area.   New, unexplained symptoms develop. (Drugs used in treatment may produce side effects.)  EXERCISES  RANGE OF MOTION (ROM) AND STRETCHING EXERCISES - Low Back Sprain Most people with lower back pain will find that their symptoms get worse with excessive bending forward (flexion) or arching at the lower back (extension). The exercises that will help resolve your symptoms will focus on the opposite motion.  Your physician, physical therapist or athletic trainer will help you determine which exercises will be most helpful to  resolve your lower back pain. Do not complete any exercises without first consulting with your caregiver. Discontinue any exercises which make your symptoms worse, until you speak to your caregiver. If you have pain, numbness or tingling which travels down into your buttocks, leg or foot, the goal of the therapy is for these symptoms to move closer to your back and eventually resolve. Sometimes, these leg symptoms will get better, but your lower back pain may worsen. This is often an indication of progress in your rehabilitation. Be very alert to any changes in your symptoms and the activities in which you participated in the 24 hours prior to the change. Sharing this information with your caregiver will allow him or her to most efficiently treat your condition. These exercises may help you when beginning to rehabilitate your injury. Your symptoms may resolve with or without further involvement from your physician, physical therapist or athletic trainer. While completing these exercises, remember:   Restoring tissue flexibility helps normal motion to return to the joints. This allows healthier, less painful movement and activity.   An effective stretch should be held for at least 30 seconds.   A stretch should  never be painful. You should only feel a gentle lengthening or release in the stretched tissue.  FLEXION RANGE OF MOTION AND STRETCHING EXERCISES: STRETCH - Flexion, Single Knee to Chest   Lie on a firm bed or floor with both legs extended in front of you.   Keeping one leg in contact with the floor, bring your opposite knee to your chest. Hold your leg in place by either grabbing behind your thigh or at your knee.   Pull until you feel a gentle stretch in your low back. Hold __________ seconds.   Slowly release your grasp and repeat the exercise with the opposite side.  Repeat __________ times. Complete this exercise __________ times per day.  STRETCH - Flexion, Double Knee to Chest  Lie  on a firm bed or floor with both legs extended in front of you.   Keeping one leg in contact with the floor, bring your opposite knee to your chest.   Tense your stomach muscles to support your back and then lift your other knee to your chest. Hold your legs in place by either grabbing behind your thighs or at your knees.   Pull both knees toward your chest until you feel a gentle stretch in your low back. Hold __________ seconds.   Tense your stomach muscles and slowly return one leg at a time to the floor.  Repeat __________ times. Complete this exercise __________ times per day.  STRETCH - Low Trunk Rotation  Lie on a firm bed or floor. Keeping your legs in front of you, bend your knees so they are both pointed toward the ceiling and your feet are flat on the floor.   Extend your arms out to the side. This will stabilize your upper body by keeping your shoulders in contact with the floor.   Gently and slowly drop both knees together to one side until you feel a gentle stretch in your low back. Hold for __________ seconds.   Tense your stomach muscles to support your lower back as you bring your knees back to the starting position. Repeat the exercise to the other side.  Repeat __________ times. Complete this exercise __________ times per day  EXTENSION RANGE OF MOTION AND FLEXIBILITY EXERCISES: STRETCH - Extension, Prone on Elbows   Lie on your stomach on the floor, a bed will be too soft. Place your palms about shoulder width apart and at the height of your head.   Place your elbows under your shoulders. If this is too painful, stack pillows under your chest.   Allow your body to relax so that your hips drop lower and make contact more completely with the floor.   Hold this position for __________ seconds.   Slowly return to lying flat on the floor.  Repeat __________ times. Complete this exercise __________ times per day.  RANGE OF MOTION - Extension, Prone Press Ups  Lie on  your stomach on the floor, a bed will be too soft. Place your palms about shoulder width apart and at the height of your head.   Keeping your back as relaxed as possible, slowly straighten your elbows while keeping your hips on the floor. You may adjust the placement of your hands to maximize your comfort. As you gain motion, your hands will come more underneath your shoulders.   Hold this position __________ seconds.   Slowly return to lying flat on the floor.  Repeat __________ times. Complete this exercise __________ times per day.  RANGE OF MOTION-  Quadruped, Neutral Spine   Assume a hands and knees position on a firm surface. Keep your hands under your shoulders and your knees under your hips. You may place padding under your knees for comfort.   Drop your head and point your tailbone toward the ground below you. This will round out your lower back like an angry cat. Hold this position for __________ seconds.   Slowly lift your head and release your tail bone so that your back sags into a large arch, like an old horse.   Hold this position for __________ seconds.   Repeat this until you feel limber in your low back.   Now, find your "sweet spot." This will be the most comfortable position somewhere between the two previous positions. This is your neutral spine. Once you have found this position, tense your stomach muscles to support your low back.   Hold this position for __________ seconds.  Repeat __________ times. Complete this exercise __________ times per day.  STRENGTHENING EXERCISES - Low Back Sprain These exercises may help you when beginning to rehabilitate your injury. These exercises should be done near your "sweet spot." This is the neutral, low-back arch, somewhere between fully rounded and fully arched, that is your least painful position. When performed in this safe range of motion, these exercises can be used for people who have either a flexion or extension based  injury. These exercises may resolve your symptoms with or without further involvement from your physician, physical therapist or athletic trainer. While completing these exercises, remember:   Muscles can gain both the endurance and the strength needed for everyday activities through controlled exercises.   Complete these exercises as instructed by your physician, physical therapist or athletic trainer. Increase the resistance and repetitions only as guided.   You may experience muscle soreness or fatigue, but the pain or discomfort you are trying to eliminate should never worsen during these exercises. If this pain does worsen, stop and make certain you are following the directions exactly. If the pain is still present after adjustments, discontinue the exercise until you can discuss the trouble with your caregiver.  STRENGTHENING - Deep Abdominals, Pelvic Tilt   Lie on a firm bed or floor. Keeping your legs in front of you, bend your knees so they are both pointed toward the ceiling and your feet are flat on the floor.   Tense your lower abdominal muscles to press your low back into the floor. This motion will rotate your pelvis so that your tail bone is scooping upwards rather than pointing at your feet or into the floor.  With a gentle tension and even breathing, hold this position for __________ seconds. Repeat __________ times. Complete this exercise __________ times per day.  STRENGTHENING - Abdominals, Crunches   Lie on a firm bed or floor. Keeping your legs in front of you, bend your knees so they are both pointed toward the ceiling and your feet are flat on the floor. Cross your arms over your chest.   Slightly tip your chin down without bending your neck.   Tense your abdominals and slowly lift your trunk high enough to just clear your shoulder blades. Lifting higher can put excessive stress on the lower back and does not further strengthen your abdominal muscles.   Control your  return to the starting position.  Repeat __________ times. Complete this exercise __________ times per day.  STRENGTHENING - Quadruped, Opposite UE/LE Lift   Assume a hands and knees position  on a firm surface. Keep your hands under your shoulders and your knees under your hips. You may place padding under your knees for comfort.   Find your neutral spine and gently tense your abdominal muscles so that you can maintain this position. Your shoulders and hips should form a rectangle that is parallel with the floor and is not twisted.   Keeping your trunk steady, lift your right hand no higher than your shoulder and then your left leg no higher than your hip. Make sure you are not holding your breath. Hold this position for __________ seconds.   Continuing to keep your abdominal muscles tense and your back steady, slowly return to your starting position. Repeat with the opposite arm and leg.  Repeat __________ times. Complete this exercise __________ times per day.  STRENGTHENING - Abdominals and Quadriceps, Straight Leg Raise   Lie on a firm bed or floor with both legs extended in front of you.   Keeping one leg in contact with the floor, bend the other knee so that your foot can rest flat on the floor.   Find your neutral spine, and tense your abdominal muscles to maintain your spinal position throughout the exercise.   Slowly lift your straight leg off the floor about 6 inches for a count of 15, making sure to not hold your breath.   Still keeping your neutral spine, slowly lower your leg all the way to the floor.  Repeat this exercise with each leg __________ times. Complete this exercise __________ times per day. POSTURE AND BODY MECHANICS CONSIDERATIONS - Low Back Sprain Keeping correct posture when sitting, standing or completing your activities will reduce the stress put on different body tissues, allowing injured tissues a chance to heal and limiting painful experiences. The following  are general guidelines for improved posture. Your physician or physical therapist will provide you with any instructions specific to your needs. While reading these guidelines, remember:  The exercises prescribed by your provider will help you have the flexibility and strength to maintain correct postures.   The correct posture provides the best environment for your joints to work. All of your joints have less wear and tear when properly supported by a spine with good posture. This means you will experience a healthier, less painful body.   Correct posture must be practiced with all of your activities, especially prolonged sitting and standing. Correct posture is as important when doing repetitive low-stress activities (typing) as it is when doing a single heavy-load activity (lifting).  RESTING POSITIONS Consider which positions are most painful for you when choosing a resting position. If you have pain with flexion-based activities (sitting, bending, stooping, squatting), choose a position that allows you to rest in a less flexed posture. You would want to avoid curling into a fetal position on your side. If your pain worsens with extension-based activities (prolonged standing, working overhead), avoid resting in an extended position such as sleeping on your stomach. Most people will find more comfort when they rest with their spine in a more neutral position, neither too rounded nor too arched. Lying on a non-sagging bed on your side with a pillow between your knees, or on your back with a pillow under your knees will often provide some relief. Keep in mind, being in any one position for a prolonged period of time, no matter how correct your posture, can still lead to stiffness. PROPER SITTING POSTURE In order to minimize stress and discomfort on your spine, you must  sit with correct posture. Sitting with good posture should be effortless for a healthy body. Returning to good posture is a gradual  process. Many people can work toward this most comfortably by using various supports until they have the flexibility and strength to maintain this posture on their own. When sitting with proper posture, your ears will fall over your shoulders and your shoulders will fall over your hips. You should use the back of the chair to support your upper back. Your lower back will be in a neutral position, just slightly arched. You may place a small pillow or folded towel at the base of your lower back for  support.  When working at a desk, create an environment that supports good, upright posture. Without extra support, muscles tire, which leads to excessive strain on joints and other tissues. Keep these recommendations in mind: CHAIR:  A chair should be able to slide under your desk when your back makes contact with the back of the chair. This allows you to work closely.   The chair's height should allow your eyes to be level with the upper part of your monitor and your hands to be slightly lower than your elbows.  BODY POSITION  Your feet should make contact with the floor. If this is not possible, use a foot rest.   Keep your ears over your shoulders. This will reduce stress on your neck and low back.  INCORRECT SITTING POSTURES  If you are feeling tired and unable to assume a healthy sitting posture, do not slouch or slump. This puts excessive strain on your back tissues, causing more damage and pain. Healthier options include:  Using more support, like a lumbar pillow.   Switching tasks to something that requires you to be upright or walking.   Talking a brief walk.   Lying down to rest in a neutral-spine position.  PROLONGED STANDING WHILE SLIGHTLY LEANING FORWARD  When completing a task that requires you to lean forward while standing in one place for a long time, place either foot up on a stationary 2-4 inch high object to help maintain the best posture. When both feet are on the ground, the  lower back tends to lose its slight inward curve. If this curve flattens (or becomes too large), then the back and your other joints will experience too much stress, tire more quickly, and can cause pain. CORRECT STANDING POSTURES Proper standing posture should be assumed with all daily activities, even if they only take a few moments, like when brushing your teeth. As in sitting, your ears should fall over your shoulders and your shoulders should fall over your hips. You should keep a slight tension in your abdominal muscles to brace your spine. Your tailbone should point down to the ground, not behind your body, resulting in an over-extended swayback posture.  INCORRECT STANDING POSTURES  Common incorrect standing postures include a forward head, locked knees and/or an excessive swayback. WALKING Walk with an upright posture. Your ears, shoulders and hips should all line-up. PROLONGED ACTIVITY IN A FLEXED POSITION When completing a task that requires you to bend forward at your waist or lean over a low surface, try to find a way to stabilize 3 out of 4 of your limbs. You can place a hand or elbow on your thigh or rest a knee on the surface you are reaching across. This will provide you more stability, so that your muscles do not tire as quickly. By keeping your knees relaxed,  or slightly bent, you will also reduce stress across your lower back. CORRECT LIFTING TECHNIQUES DO :  Assume a wide stance. This will provide you more stability and the opportunity to get as close as possible to the object which you are lifting.   Tense your abdominals to brace your spine. Bend at the knees and hips. Keeping your back locked in a neutral-spine position, lift using your leg muscles. Lift with your legs, keeping your back straight.   Test the weight of unknown objects before attempting to lift them.   Try to keep your elbows locked down at your sides in order get the best strength from your shoulders when  carrying an object.   Always ask for help when lifting heavy or awkward objects.  INCORRECT LIFTING TECHNIQUES DO NOT:   Lock your knees when lifting, even if it is a small object.   Bend and twist. Pivot at your feet or move your feet when needing to change directions.   Assume that you can safely pick up even a paperclip without proper posture.  Document Released: 01/18/2005 Document Revised: 01/07/2011 Document Reviewed: 05/02/2008 [facility] Patient Information 2012 [place], [place].

## 2011-09-22 ENCOUNTER — Encounter: Payer: Self-pay | Admitting: Obstetrics & Gynecology

## 2011-09-22 ENCOUNTER — Ambulatory Visit (INDEPENDENT_AMBULATORY_CARE_PROVIDER_SITE_OTHER): Payer: Self-pay | Admitting: Obstetrics & Gynecology

## 2011-09-22 VITALS — BP 130/86 | HR 98 | Temp 98.6°F | Ht 65.5 in | Wt 174.0 lb

## 2011-09-22 DIAGNOSIS — D259 Leiomyoma of uterus, unspecified: Secondary | ICD-10-CM

## 2011-09-22 DIAGNOSIS — D219 Benign neoplasm of connective and other soft tissue, unspecified: Secondary | ICD-10-CM

## 2011-09-22 NOTE — Progress Notes (Signed)
  Subjective:    Patient ID: Diane Sexton, female    DOB: 05-16-1970, 41 y.o.   MRN: [id number]  HPI 41 yo lady with known fibroids. She had a [place] but she reports that her periods are becoming heavier and more painful. She is requesting a hysterectomy and she would like it in January.   Review of Systems     Objective:   Physical Exam  [id number] week mobile uterus, non enlarged adnexa      Assessment & Plan:   Symptomatic fibroids- I will schedule a TAH/bilateral salpingectomy for January,

## 2011-11-10 ENCOUNTER — Emergency Department (HOSPITAL_BASED_OUTPATIENT_CLINIC_OR_DEPARTMENT_OTHER)
Admission: EM | Admit: 2011-11-10 | Discharge: 2011-11-10 | Disposition: A | Discharge status: Home / Self Care | Payer: No Typology Code available for payment source | Attending: Emergency Medicine | Admitting: Emergency Medicine

## 2011-11-10 ENCOUNTER — Encounter (HOSPITAL_BASED_OUTPATIENT_CLINIC_OR_DEPARTMENT_OTHER): Payer: Self-pay

## 2011-11-10 DIAGNOSIS — F329 Major depressive disorder, single episode, unspecified: Secondary | ICD-10-CM | POA: Insufficient documentation

## 2011-11-10 DIAGNOSIS — Z833 Family history of diabetes mellitus: Secondary | ICD-10-CM | POA: Insufficient documentation

## 2011-11-10 DIAGNOSIS — Z885 Allergy status to narcotic agent status: Secondary | ICD-10-CM | POA: Insufficient documentation

## 2011-11-10 DIAGNOSIS — F3289 Other specified depressive episodes: Secondary | ICD-10-CM | POA: Insufficient documentation

## 2011-11-10 DIAGNOSIS — Z8249 Family history of ischemic heart disease and other diseases of the circulatory system: Secondary | ICD-10-CM | POA: Insufficient documentation

## 2011-11-10 DIAGNOSIS — M549 Dorsalgia, unspecified: Secondary | ICD-10-CM | POA: Insufficient documentation

## 2011-11-10 MED ORDER — IBUPROFEN 800 MG PO TABS: 800.0000 mg | ORAL_TABLET | Freq: Three times a day (TID) | ORAL | Status: DC

## 2011-11-10 MED ORDER — CYCLOBENZAPRINE HCL 10 MG PO TABS: 10.0000 mg | ORAL_TABLET | Freq: Two times a day (BID) | ORAL | Status: DC | PRN
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
## 2011-11-11 NOTE — ED Provider Notes (Signed)
Medical screening examination/treatment/procedure(s) were performed by non-physician practitioner and as supervising physician I was immediately available for consultation/collaboration.    Emmanuelle Hibbitts L Larrisha Babineau, MD 11/11/11 1704 

## 2011-11-23 ENCOUNTER — Encounter (HOSPITAL_COMMUNITY): Payer: Self-pay | Admitting: *Deleted

## 2011-11-23 ENCOUNTER — Emergency Department (HOSPITAL_COMMUNITY)
Admission: EM | Admit: 2011-11-23 | Discharge: 2011-11-23 | Disposition: A | Discharge status: Home / Self Care | Payer: Self-pay | Attending: Emergency Medicine | Admitting: Emergency Medicine

## 2011-11-23 DIAGNOSIS — F3289 Other specified depressive episodes: Secondary | ICD-10-CM | POA: Insufficient documentation

## 2011-11-23 DIAGNOSIS — IMO0002 Reserved for concepts with insufficient information to code with codable children: Secondary | ICD-10-CM | POA: Insufficient documentation

## 2011-11-23 DIAGNOSIS — Z79899 Other long term (current) drug therapy: Secondary | ICD-10-CM | POA: Insufficient documentation

## 2011-11-23 DIAGNOSIS — E785 Hyperlipidemia, unspecified: Secondary | ICD-10-CM | POA: Insufficient documentation

## 2011-11-23 DIAGNOSIS — Y939 Activity, unspecified: Secondary | ICD-10-CM | POA: Insufficient documentation

## 2011-11-23 DIAGNOSIS — T192XXA Foreign body in vulva and vagina, initial encounter: Secondary | ICD-10-CM | POA: Insufficient documentation

## 2011-11-23 DIAGNOSIS — Y929 Unspecified place or not applicable: Secondary | ICD-10-CM | POA: Insufficient documentation

## 2011-11-23 DIAGNOSIS — Z791 Long term (current) use of non-steroidal anti-inflammatories (NSAID): Secondary | ICD-10-CM | POA: Insufficient documentation

## 2011-11-23 DIAGNOSIS — F329 Major depressive disorder, single episode, unspecified: Secondary | ICD-10-CM | POA: Insufficient documentation

## 2011-11-23 DIAGNOSIS — Z8742 Personal history of other diseases of the female genital tract: Secondary | ICD-10-CM | POA: Insufficient documentation

## 2011-11-23 MED ORDER — LEVONORGESTREL 0.75 MG PO TABS: 0.7500 mg | ORAL_TABLET | Freq: Two times a day (BID) | ORAL | Status: DC

## 2011-11-23 NOTE — ED Provider Notes (Signed)
History     CSN: [id number]  Arrival date & time 11/23/11  2023   None     Chief Complaint  Patient presents with  . Vaginal Discharge    (Consider location/radiation/quality/duration/timing/severity/associated sxs/prior treatment) HPI Comments: Change condom after intercourse.  Patient is mid cycle.  Does not wish to be pregnantshe attempted to remove the condom herself, but was unable to  The history is provided by the patient.    Past Medical History  Diagnosis Date  . Hyperlipemia   . Depression     bi-polar  . PMDD (premenstrual dysphoric disorder)     Past Surgical History  Procedure Date  . Appendectomy 2009  . [place] 2008    Family History  Problem Relation Age of Onset  . Hypertension Mother   . Heart attack Mother 48    light MI  . Hypertension Father   . Diabetes Father     History  Substance Use Topics  . Smoking status: Never Smoker   . Smokeless tobacco: Never Used  . Alcohol Use: Yes     occasionally    OB History    Grav Para Term Preterm Abortions TAB SAB Ect Mult Living   3    3 3     0      Review of Systems  Constitutional: Negative for fever and chills.  Gastrointestinal: Negative for abdominal pain.  Genitourinary: Negative for dysuria, vaginal bleeding, vaginal discharge and vaginal pain.  Neurological: Negative for dizziness and weakness.    Allergies  Oxycodone and Hydrocodone-apap-dietary prod  Home Medications   Current Outpatient Rx  Name Route Sig Dispense Refill  . CYCLOBENZAPRINE HCL 10 MG PO TABS Oral Take 1 tablet (10 mg total) by mouth 2 (two) times daily as needed for muscle spasms. 20 tablet 0  . IBUPROFEN 800 MG PO TABS Oral Take 800 mg by mouth every 8 (eight) hours as needed. For pain.      BP 141/83  Pulse 94  Temp 98.1 F (36.7 C) (Oral)  Resp 20  SpO2 96%  Physical Exam  Constitutional: She appears well-developed and well-nourished.  Eyes: Pupils are equal, round, and reactive to light.  Neck:  Normal range of motion.  Cardiovascular: Normal rate.   Abdominal: Soft. There is no tenderness.  Genitourinary: Vagina normal. No vaginal discharge found.       Condom removed without difficulty    ED Course  Procedures (including critical care time)  Labs Reviewed - No data to display No results found.   No diagnosis found.    MDM   Vaginal  Foreign body removed without difficulty        Arman Filter, NP 11/23/11 2320

## 2011-11-23 NOTE — ED Notes (Signed)
Pt reports having sex tonight and getting a condom stuck in her vagina 1 hour ago.  Pt states that she tried to pull it out but is unable due to bleeding.

## 2011-11-24 NOTE — ED Provider Notes (Signed)
Medical screening examination/treatment/procedure(s) were performed by non-physician practitioner and as supervising physician I was immediately available for consultation/collaboration.  Allyce Bochicchio, MD 11/24/11 0119 

## 2011-12-15 ENCOUNTER — Encounter: Payer: Self-pay | Admitting: Family Medicine

## 2011-12-15 ENCOUNTER — Ambulatory Visit (INDEPENDENT_AMBULATORY_CARE_PROVIDER_SITE_OTHER): Payer: Self-pay | Admitting: Family Medicine

## 2011-12-15 VITALS — BP 152/97 | HR 84 | Ht 65.5 in | Wt 179.0 lb

## 2011-12-15 DIAGNOSIS — R030 Elevated blood-pressure reading, without diagnosis of hypertension: Secondary | ICD-10-CM

## 2011-12-15 DIAGNOSIS — M7742 Metatarsalgia, left foot: Secondary | ICD-10-CM | POA: Insufficient documentation

## 2011-12-15 DIAGNOSIS — M7750 Other enthesopathy of unspecified foot: Secondary | ICD-10-CM

## 2011-12-15 NOTE — Assessment & Plan Note (Signed)
Elevated today, has slowly increased with each visit.  Pt would like to try diet and exercise changes first prior to starting medicine.  Will start with salt reduction. F /u 1 month for recheck.  BP Readings from Last 3 Encounters:  12/15/11 152/97  11/23/11 141/83  11/10/11 134/94

## 2011-12-15 NOTE — Assessment & Plan Note (Signed)
May be Morton's neuroma.  Will f/u with sports medicine for metatarsal pad.

## 2011-12-15 NOTE — Patient Instructions (Addendum)
It was good to see you today.  I want you to make an appointment [facility] to get a pad put in your shoe to help with your foot pain.  Their phone number is [phone].  Your blood pressure is elevated again today, we should recheck it in 1 month and consider starting a medicine if it is still elevated then.  We will likely check a lab test at that time as well.

## 2011-12-15 NOTE — Progress Notes (Signed)
S: Pt comes in today for SDA for feet pain x2 months.  Patient does not have DM.  Pain is mostly L foot at base of toes.  Worse with curling toes and when walking/putting pressure on them.  Worst first thing in the morning, gets a little better with walking.  Doesn't hurt at all when she's sitting and has no pressure on feet.  Feels like a soreness.  No numbness/tingling.  No pain in ankle or knee.  No weakness in foot.  Has never had any foot injuries or surgeries.  Typically wears tennis shoes, not a lot of heels.  Heels do not make it worse, being barefoot does not make it worse.      ROS: Per HPI  History  Smoking status  . Never Smoker   Smokeless tobacco  . Never Used    O:  Filed Vitals:   12/15/11 1622  BP: 152/97  Pulse: 84    Gen: NAD CV: RRR, no murmur Pulm: CTA bilat, no wheezes or crackles L foot: no obvious deformities, pain most severe between 2nd and 3rd metatarsals, pain with squeeze test; mild supination with standing  L ankle: minimal swelling around ankle, full ROM, no bruising, no pain   A/P: 41 y.o. female p/w metatarsalgia, elevated BP -See problem list -f/u in 1 month

## 2012-01-06 ENCOUNTER — Ambulatory Visit: Payer: Self-pay | Admitting: Advanced Practice Midwife

## 2012-01-07 ENCOUNTER — Other Ambulatory Visit: Payer: Self-pay | Admitting: Family Medicine

## 2012-01-07 DIAGNOSIS — Z1231 Encounter for screening mammogram for malignant neoplasm of breast: Secondary | ICD-10-CM

## 2012-01-13 ENCOUNTER — Ambulatory Visit: Payer: Self-pay | Admitting: Family Medicine

## 2012-01-31 ENCOUNTER — Ambulatory Visit (INDEPENDENT_AMBULATORY_CARE_PROVIDER_SITE_OTHER): Payer: Self-pay | Admitting: Obstetrics & Gynecology

## 2012-01-31 ENCOUNTER — Encounter: Payer: Self-pay | Admitting: Obstetrics & Gynecology

## 2012-01-31 VITALS — BP 133/87 | HR 100 | Temp 97.5°F | Ht 65.0 in | Wt 180.6 lb

## 2012-01-31 DIAGNOSIS — IMO0002 Reserved for concepts with insufficient information to code with codable children: Secondary | ICD-10-CM

## 2012-01-31 DIAGNOSIS — R6889 Other general symptoms and signs: Secondary | ICD-10-CM

## 2012-01-31 NOTE — Progress Notes (Signed)
  Subjective:    Patient ID: Diane Sexton, female    DOB: [birth year], 41 y.o.   MRN: [id number]  HPI  She is here today for a follow up pap smear (LGSIL). She has no complaints except a broken condom last month with a new partner. She would like to have GC/CT testing.  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

## 2012-02-09 ENCOUNTER — Encounter: Payer: Self-pay | Admitting: *Deleted

## 2012-02-14 ENCOUNTER — Ambulatory Visit (HOSPITAL_BASED_OUTPATIENT_CLINIC_OR_DEPARTMENT_OTHER)
Admission: RE | Admit: 2012-02-14 | Discharge: 2012-02-14 | Disposition: A | Discharge status: Home / Self Care | Payer: Self-pay | Source: Ambulatory Visit | Attending: Nurse Practitioner | Admitting: Nurse Practitioner

## 2012-02-14 DIAGNOSIS — Z1231 Encounter for screening mammogram for malignant neoplasm of breast: Secondary | ICD-10-CM | POA: Insufficient documentation

## 2012-02-21 ENCOUNTER — Telehealth: Payer: Self-pay | Admitting: General Practice

## 2012-02-21 NOTE — Telephone Encounter (Signed)
Patient called stating she is a patient of Dr Marice Potter and she received a letter about her pap results but there was not anything about her STD results and would like a call back with the results

## 2012-02-21 NOTE — Telephone Encounter (Signed)
Called the patient back and informed her that I received her message wanting STD results and that both gonorrhea and chlamydia came back negative. Patient verbalized understanding and asked if her pap was still showing low grade growth, I told patient that was correct and that she just needs to follow up in a year like the letter she received stated. Patient verbalized understanding and had no further questions

## 2012-02-22 ENCOUNTER — Ambulatory Visit: Payer: Self-pay | Admitting: Family Medicine

## 2012-02-28 ENCOUNTER — Inpatient Hospital Stay: Admit: 2012-02-28 | Payer: Self-pay | Admitting: Obstetrics & Gynecology

## 2012-02-28 SURGERY — HYSTERECTOMY, ABDOMINAL
Anesthesia: Choice | Site: Abdomen

## 2012-05-11 ENCOUNTER — Ambulatory Visit (INDEPENDENT_AMBULATORY_CARE_PROVIDER_SITE_OTHER): Payer: PRIVATE HEALTH INSURANCE | Admitting: Family Medicine

## 2012-05-11 ENCOUNTER — Encounter: Payer: Self-pay | Admitting: Family Medicine

## 2012-05-11 VITALS — BP 145/83 | HR 97 | Ht 65.5 in | Wt 182.0 lb

## 2012-05-11 DIAGNOSIS — D239 Other benign neoplasm of skin, unspecified: Secondary | ICD-10-CM

## 2012-05-11 DIAGNOSIS — I10 Essential (primary) hypertension: Secondary | ICD-10-CM | POA: Insufficient documentation

## 2012-05-11 DIAGNOSIS — Z6825 Body mass index (BMI) 25.0-25.9, adult: Secondary | ICD-10-CM

## 2012-05-11 DIAGNOSIS — R030 Elevated blood-pressure reading, without diagnosis of hypertension: Secondary | ICD-10-CM

## 2012-05-11 DIAGNOSIS — D229 Melanocytic nevi, unspecified: Secondary | ICD-10-CM

## 2012-05-11 DIAGNOSIS — L821 Other seborrheic keratosis: Secondary | ICD-10-CM | POA: Insufficient documentation

## 2012-05-11 DIAGNOSIS — E663 Overweight: Secondary | ICD-10-CM | POA: Insufficient documentation

## 2012-05-11 MED ORDER — HYDROCHLOROTHIAZIDE 25 MG PO TABS: 12.5000 mg | ORAL_TABLET | Freq: Every day | ORAL | Status: DC

## 2012-05-11 NOTE — Patient Instructions (Signed)
It was good to see you today.  Your blood pressure is still high; our goal is < 130/ 80 for you.  I sent in a new medication called HCTZ to the pharmacy.  We will check your labs when you come back fasting.  The area on your breast is a seborrheic keratosis-- not anything to worry about.  We can also freeze those spots if they become irritated.  Come back to see me in 1-2 months and we will see how your blood pressure is doing on the medicine.  Come sooner for any issues.    Seborrheic Keratosis Seborrheic keratosis is a common, noncancerous (benign) skin growth that can occur anywhere on the skin.It looks like "stuck-on," waxy, rough, tan, brown, or black spots on the skin. These skin growths can be flat or raised.They are often called "barnacles" because of their pasted-on appearance.Usually, these skin growths appear in adulthood, around age 33, and increase in number as you age. They may also develop during pregnancy or following estrogen therapy. Many people may only have one growth appear in their lifetime, while some people may develop many growths. CAUSES It is unknown what causes these skin growths, but they appear to run in families. SYMPTOMS Seborrheic keratosis is often located on the face, chest, shoulders, back, or other areas. These growths are:  Usually painless, but may become irritated and itchy.  Yellow, brown, black, or other colors.  Slightly raised or have a flat surface.  Sometimes rough or wart-like in texture.  Often waxy on the surface.  Round or oval-shaped.  Sometimes "stuck-on" in appearance.  Sometimes single, but there are usually many growths. Any growth that bleeds, itches on a regular basis, becomes inflamed, or becomes irritated needs to be evaluated by a skin specialist (dermatologist). DIAGNOSIS Diagnosis is mainly based on the way the growths appear. In some cases, it can be difficult to tell this type of skin growth from skin cancer. A  skin growth tissue sample (biopsy) may be used to confirm the diagnosis. TREATMENT Most often, treatment is not needed because the skin growths are benign.If the skin growth is irritated easily by clothing or jewelry, causing it to scab or bleed, treatment may be recommended. Patients may also choose to have the growths removed because they do not like their appearance. Most commonly, these growths are treated with cryosurgery. In cryosurgery, liquid nitrogen is applied to "freeze" the growth. The growth usually falls off within a matter of days. A blister may form and dry into a scab that will also fall off. After the growth or scab falls off, it may leave a dark or light spot on the skin. This color may fade over time, or it may remain permanent on the skin. HOME CARE INSTRUCTIONS If the skin growths are treated with cryosurgery, the treated area needs to be kept clean with water and soap. SEEK MEDICAL CARE IF:  You have questions about these growths or other skin problems.  You develop new symptoms, including:  A change in the appearance of the skin growth.  New growths.  Any bleeding, itching, or pain in the growths.  A skin growth that looks similar to seborrheic keratosis. Document Released: 02/20/2010 Document Revised: 04/12/2011 Document Reviewed: 02/20/2010 [facility] Patient Information 2013 [place], [place].

## 2012-05-11 NOTE — Assessment & Plan Note (Signed)
Pt willing to accept dx of HTN at this time, also willing to start medication.  No other medical problems.  Will start HCTZ 12.5mg , check BMET today.

## 2012-05-11 NOTE — Progress Notes (Signed)
S: Pt comes in today for follow up.  HYPERTENSION BP Readings from Last 3 Encounters:  05/11/12 145/83  01/31/12 133/87  12/15/11 152/97   Meds: NONE, but ready to start Symptoms: Headache: No Dizziness: No Vision changes: No SOB:  No Chest pain: No LE swelling: Yes : bilateral ankles, L > R, no ankle pain or redness Tobacco use: No   MOLE On her left breast.  Has been there for 2 years.  Does indoor tanning and area is exposed at that time.  Thinks maybe it is getting bigger.  Is just wanting to get it checked to make sure it isn't cancer.  No color change, no shape change.  No history of skin cancer- personal or family.     ROS: Per HPI  History  Smoking status  . Never Smoker   Smokeless tobacco  . Never Used    O:  Filed Vitals:   05/11/12 1340  BP: 145/83  Pulse: 97    Gen: NAD CV: RRR, no murmur Pulm: CTA bilat, no wheezes or crackles Ext: Warm,  Nonpitting edema B ankles, no pretibial edema; full ROM of ankles, no pain or TTP Skin: stuck-on, light brown 0.5 x 1.5 cm raised plaque on left lateral breast x2, c/w SK, no surrounding skin irritation, no other skin changes   A/P: 42 y.o. female p/w elevated BP, seb K -See problem list -f/u in 1-2 months

## 2012-05-15 ENCOUNTER — Other Ambulatory Visit: Payer: PRIVATE HEALTH INSURANCE

## 2012-05-15 DIAGNOSIS — E663 Overweight: Secondary | ICD-10-CM

## 2012-05-15 DIAGNOSIS — I10 Essential (primary) hypertension: Secondary | ICD-10-CM

## 2012-05-15 LAB — LIPID PANEL: Cholesterol: 257 mg/dL — ABNORMAL HIGH (ref 0–200)

## 2012-05-15 LAB — COMPREHENSIVE METABOLIC PANEL
Albumin: 4 g/dL (ref 3.5–5.2)
BUN: 10 mg/dL (ref 6–23)
Calcium: 8.9 mg/dL (ref 8.4–10.5)
Chloride: 102 mEq/L (ref 96–112)
Glucose, Bld: 86 mg/dL (ref 70–99)
Potassium: 3.6 mEq/L (ref 3.5–5.3)

## 2012-05-15 NOTE — Progress Notes (Signed)
CMP AND FLP DONE TODAY Diane Sexton 

## 2012-05-17 ENCOUNTER — Encounter: Payer: Self-pay | Admitting: Family Medicine

## 2012-05-22 ENCOUNTER — Encounter: Payer: Self-pay | Admitting: Family Medicine

## 2012-06-01 ENCOUNTER — Telehealth: Payer: Self-pay | Admitting: Family Medicine

## 2012-06-01 DIAGNOSIS — E785 Hyperlipidemia, unspecified: Secondary | ICD-10-CM

## 2012-06-01 MED ORDER — LOVASTATIN 20 MG PO TABS: 20.0000 mg | ORAL_TABLET | Freq: Every day | ORAL | Status: DC

## 2012-06-01 NOTE — Telephone Encounter (Signed)
Patient is calling to ask Dr. Fara Boros to go ahead and send in the Rx for the Cholesterol medication that she thinks is called Atorvastatin to [facility] in [place].

## 2012-06-01 NOTE — Telephone Encounter (Signed)
Will forward to Dr McGill 

## 2012-06-01 NOTE — Telephone Encounter (Signed)
Medication sent in  Thanks

## 2012-06-13 ENCOUNTER — Ambulatory Visit: Payer: Self-pay | Admitting: Family Medicine

## 2012-06-19 ENCOUNTER — Encounter: Payer: Self-pay | Admitting: Family Medicine

## 2012-06-19 ENCOUNTER — Ambulatory Visit (INDEPENDENT_AMBULATORY_CARE_PROVIDER_SITE_OTHER): Payer: PRIVATE HEALTH INSURANCE | Admitting: Family Medicine

## 2012-06-19 VITALS — BP 119/81 | HR 78 | Ht 65.5 in | Wt 181.0 lb

## 2012-06-19 DIAGNOSIS — E785 Hyperlipidemia, unspecified: Secondary | ICD-10-CM

## 2012-06-19 DIAGNOSIS — I10 Essential (primary) hypertension: Secondary | ICD-10-CM

## 2012-06-19 NOTE — Assessment & Plan Note (Signed)
Well controlled on HCTZ 12.5, continue.  F/u 3 months.  Is having some problems with attaining orgasm that she thinks may be from medication-- she will call or return if it becomes problematic enough that she wants to change medication.

## 2012-06-19 NOTE — Progress Notes (Signed)
S: Pt comes in today for follow up.  HYPERTENSION BP: 119/81 Meds: HCTZ 12.5 Taking meds: Yes     # of doses missed/week: 1 Symptoms: Headache: No Dizziness: No Vision changes: No SOB:  No Chest pain: No LE swelling: No Tobacco use: No   HYPERLIPIDEMIA  Meds: lovastatin 20 Muscle aches: no and has not started it yet Last FLP or LDL:  Lab Results  Component Value Date   LDLCALC 183* 05/15/2012   Lab Results  Component Value Date   CHOL 257* 05/15/2012   HDL 41 05/15/2012   LDLCALC 183* 05/15/2012   LDLDIRECT 174* 12/24/2009   TRIG 164* 05/15/2012   CHOLHDL 6.3 05/15/2012   Weight:  Wt Readings from Last 3 Encounters:  06/19/12 181 lb (82.101 kg)  05/11/12 182 lb (82.555 kg)  01/31/12 180 lb 9.6 oz (81.92 kg)     ROS: Per HPI  History  Smoking status  . Never Smoker   Smokeless tobacco  . Never Used    O:  Filed Vitals:   06/19/12 1529  BP: 119/81  Pulse: 78    Gen: NAD CV: RRR, no murmur Pulm: CTA bilat, no wheezes or crackles Ext: Warm, no edema   A/P: 42 y.o. female p/w HTN, HLD -See problem list -f/u in 3 months

## 2012-06-19 NOTE — Patient Instructions (Addendum)
It was good to see you today.  Your blood pressure looks great! If you decide that the side effect is too bothersome, we can try switching you to a different type of medicine.  Start your cholesterol medicine-- if you start having a lot of muscle aches, you can take 1/2 a pill.  Come back in 3 months for your next blood pressure check.

## 2012-06-19 NOTE — Assessment & Plan Note (Signed)
Has not started statin yet-- will pick it up today.

## 2012-07-25 ENCOUNTER — Ambulatory Visit (INDEPENDENT_AMBULATORY_CARE_PROVIDER_SITE_OTHER): Payer: PRIVATE HEALTH INSURANCE | Admitting: Psychiatry

## 2012-07-25 ENCOUNTER — Encounter (HOSPITAL_COMMUNITY): Payer: Self-pay | Admitting: Psychiatry

## 2012-07-25 VITALS — BP 122/83 | HR 71 | Ht 65.0 in | Wt 183.0 lb

## 2012-07-25 DIAGNOSIS — N943 Premenstrual tension syndrome: Secondary | ICD-10-CM

## 2012-07-25 DIAGNOSIS — F3160 Bipolar disorder, current episode mixed, unspecified: Secondary | ICD-10-CM

## 2012-07-25 MED ORDER — ARIPIPRAZOLE 5 MG PO TABS: 5.0000 mg | ORAL_TABLET | Freq: Every day | ORAL | Status: DC
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
## 2012-07-26 ENCOUNTER — Telehealth (HOSPITAL_COMMUNITY): Payer: Self-pay

## 2012-07-26 DIAGNOSIS — F3181 Bipolar II disorder: Secondary | ICD-10-CM

## 2012-07-26 MED ORDER — LAMOTRIGINE 25 MG PO TABS: ORAL_TABLET | ORAL | Status: DC

## 2012-07-26 NOTE — Telephone Encounter (Signed)
Patient reports that Abilify is too expensive will switch to Lamictal and titrate to 100 mg.

## 2012-07-26 NOTE — Telephone Encounter (Signed)
PT WENT TO GET ABILIFY AND IT WAS OVER $1000 PATIENT CANNOT AFFORD THIS CAN SHE PLEASE GET SOMETHING ELSE

## 2012-08-10 ENCOUNTER — Ambulatory Visit (HOSPITAL_COMMUNITY): Payer: Self-pay | Admitting: Psychiatry

## 2012-08-11 ENCOUNTER — Ambulatory Visit (INDEPENDENT_AMBULATORY_CARE_PROVIDER_SITE_OTHER): Payer: PRIVATE HEALTH INSURANCE | Admitting: Psychiatry

## 2012-08-11 ENCOUNTER — Encounter (HOSPITAL_COMMUNITY): Payer: Self-pay | Admitting: Psychiatry

## 2012-08-11 VITALS — BP 119/80 | HR 75 | Ht 65.0 in | Wt 176.0 lb

## 2012-08-11 DIAGNOSIS — F3181 Bipolar II disorder: Secondary | ICD-10-CM

## 2012-08-11 DIAGNOSIS — F3189 Other bipolar disorder: Secondary | ICD-10-CM

## 2012-08-11 MED ORDER — LAMOTRIGINE 25 MG PO TABS: ORAL_TABLET | ORAL | Status: DC

## 2012-08-11 MED ORDER — LAMOTRIGINE 100 MG PO TABS: 100.0000 mg | ORAL_TABLET | Freq: Every day | ORAL | Status: DC
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
## 2012-08-25 ENCOUNTER — Ambulatory Visit (INDEPENDENT_AMBULATORY_CARE_PROVIDER_SITE_OTHER): Payer: PRIVATE HEALTH INSURANCE | Admitting: Family Medicine

## 2012-08-25 ENCOUNTER — Other Ambulatory Visit (HOSPITAL_COMMUNITY)
Admission: RE | Admit: 2012-08-25 | Discharge: 2012-08-25 | Disposition: A | Discharge status: Home / Self Care | Payer: PRIVATE HEALTH INSURANCE | Source: Ambulatory Visit | Attending: Family Medicine | Admitting: Family Medicine

## 2012-08-25 ENCOUNTER — Encounter: Payer: Self-pay | Admitting: Family Medicine

## 2012-08-25 VITALS — BP 136/83 | HR 111 | Temp 98.5°F | Ht 65.0 in | Wt 179.0 lb

## 2012-08-25 DIAGNOSIS — N760 Acute vaginitis: Secondary | ICD-10-CM | POA: Insufficient documentation

## 2012-08-25 DIAGNOSIS — N898 Other specified noninflammatory disorders of vagina: Secondary | ICD-10-CM

## 2012-08-25 DIAGNOSIS — Z113 Encounter for screening for infections with a predominantly sexual mode of transmission: Secondary | ICD-10-CM | POA: Insufficient documentation

## 2012-08-25 LAB — POCT WET PREP (WET MOUNT)

## 2012-08-25 MED ORDER — FLUCONAZOLE 150 MG PO TABS: 150.0000 mg | ORAL_TABLET | Freq: Once | ORAL | Status: DC

## 2012-08-25 MED ORDER — METRONIDAZOLE 500 MG PO TABS: 500.0000 mg | ORAL_TABLET | Freq: Two times a day (BID) | ORAL | Status: DC

## 2012-08-25 NOTE — Patient Instructions (Addendum)
Diane Sexton,  Thank you for coming in today. It was a pleasure meeting you. Your wet prep was positive for BV as expected. Please take the Flagyl for the next week followed by the one that can pill to prevent vaginal yeast infection. Remember not to take Flagyl with alcohol as this will make you sick put.  If you're interested in changing to a female provider please inform the ladies up front that you like to switch. That provided dictated PCP request for. It is most likely that he be switched to one of the female providers who was on the same team as your previous PCP Dr. Fara Boros, but that is not the case I will be happy to become his new PCP.  You will receive a call from either myself or one of my with my nurses regarding your other test results.  Dr. Armen Pickup

## 2012-08-25 NOTE — Progress Notes (Signed)
Subjective:     Patient ID: Diane Sexton, female   DOB: [birth year], 42 y.o.   MRN: [id number]  HPI 42 year old female presents for vaginal same-day visit with the complaint of vaginal discharge and odor. Symptoms have been present for the past 2 days. Patient reports that she is sexually active with one female partner for the past 2 months. They initially used condoms but has not used condoms for the past 7 months. She denies vaginal bleeding, itching or irritation associated with the discharge. She denies vaginal lesions. She denies fever, nausea, vomiting. She denies low back, lower abdominal and pelvic pain.   Review of Systems As per history of present illness    Objective:   Physical Exam BP 136/83  Pulse 111  Temp(Src) 98.5 F (36.9 C) (Oral)  Ht 5\' 5"  (1.651 m)  Wt 179 lb (81.194 kg)  BMI 29.79 kg/m2  LMP 08/07/2012 General appearance: alert, cooperative and no distress Back: symmetric, no curvature. ROM normal. No CVA tenderness. Abdomen: soft, non-tender; bowel sounds normal; no masses,  no organomegaly Pelvic:  External exam: Normal without lesions. Vaginal: homogenous white vaginal discharge. Mild odor. No vaginal lesions. Cervix: normal without discharge or lesions. Bimanual: negative cervical motion tenderness. Palpable uterine fibroids. No fundal tenderness. No adnexal mass or tenderness.    Assessment and Plan:

## 2012-08-25 NOTE — Assessment & Plan Note (Signed)
A: BV on wet prep. Gc/chlam pending. P: Treat BV with flagyl followed by diflucan per orders to prevent vaginal candidiasis Patient will be contacted with results of gonorrhea Chlamydia and testing.

## 2012-09-07 ENCOUNTER — Ambulatory Visit: Payer: PRIVATE HEALTH INSURANCE

## 2012-09-11 ENCOUNTER — Telehealth: Payer: Self-pay | Admitting: Family Medicine

## 2012-09-11 NOTE — Telephone Encounter (Signed)
Will fwd to Dr. Casper Harrison.  Quynh Basso, Darlyne Russian, CMA
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
## 2012-09-11 NOTE — Telephone Encounter (Signed)
Patient is calling for a refill on Flexeril to go to [facility] on [address] in [place].  This is actually going to be a new Rx for this pharmacy as her old pharmacy has gone out of business.

## 2012-09-13 NOTE — Telephone Encounter (Signed)
Patient called to check the status of her request for this medication and was told she needed to schedule an appt.  She scheduled for this Friday morning.

## 2012-09-15 ENCOUNTER — Ambulatory Visit: Payer: Self-pay | Admitting: Family Medicine

## 2012-09-22 ENCOUNTER — Ambulatory Visit (HOSPITAL_COMMUNITY): Payer: Self-pay | Admitting: Psychiatry

## 2012-10-04 ENCOUNTER — Other Ambulatory Visit: Payer: Self-pay | Admitting: *Deleted

## 2012-10-04 DIAGNOSIS — N898 Other specified noninflammatory disorders of vagina: Secondary | ICD-10-CM

## 2012-10-05 ENCOUNTER — Ambulatory Visit: Payer: Self-pay | Admitting: Family

## 2012-10-06 ENCOUNTER — Ambulatory Visit: Payer: Self-pay

## 2012-10-09 ENCOUNTER — Ambulatory Visit (INDEPENDENT_AMBULATORY_CARE_PROVIDER_SITE_OTHER): Payer: PRIVATE HEALTH INSURANCE | Admitting: Obstetrics and Gynecology

## 2012-10-09 ENCOUNTER — Encounter: Payer: Self-pay | Admitting: Obstetrics and Gynecology

## 2012-10-09 VITALS — BP 114/75 | HR 80 | Ht 65.5 in | Wt 177.2 lb

## 2012-10-09 DIAGNOSIS — N760 Acute vaginitis: Secondary | ICD-10-CM

## 2012-10-09 MED ORDER — METRONIDAZOLE 250 MG PO TABS: 500.0000 mg | ORAL_TABLET | Freq: Two times a day (BID) | ORAL | Status: AC

## 2012-10-09 NOTE — Patient Instructions (Signed)
Bacterial Vaginosis Bacterial vaginosis (BV) is a vaginal infection where the normal balance of bacteria in the vagina is disrupted. The normal balance is then replaced by an overgrowth of certain bacteria. There are several different kinds of bacteria that can cause BV. BV is the most common vaginal infection in women of childbearing age. CAUSES   The cause of BV is not fully understood. BV develops when there is an increase or imbalance of harmful bacteria.  Some activities or behaviors can upset the normal balance of bacteria in the vagina and put women at increased risk including:  Having a new sex partner or multiple sex partners.  Douching.  Using an intrauterine device (IUD) for contraception.  It is not clear what role sexual activity plays in the development of BV. However, women that have never had sexual intercourse are rarely infected with BV. Women do not get BV from toilet seats, bedding, swimming pools or from touching objects around them.  SYMPTOMS   Grey vaginal discharge.  A fish-like odor with discharge, especially after sexual intercourse.  Itching or burning of the vagina and vulva.  Burning or pain with urination.  Some women have no signs or symptoms at all. DIAGNOSIS  Your caregiver must examine the vagina for signs of BV. Your caregiver will perform lab tests and look at the sample of vaginal fluid through a microscope. They will look for bacteria and abnormal cells (clue cells), a pH test higher than 4.5, and a positive amine test all associated with BV.  RISKS AND COMPLICATIONS   Pelvic inflammatory disease (PID).  Infections following gynecology surgery.  Developing HIV.  Developing herpes virus. TREATMENT  Sometimes BV will clear up without treatment. However, all women with symptoms of BV should be treated to avoid complications, especially if gynecology surgery is planned. Female partners generally do not need to be treated. However, BV may spread  between female sex partners so treatment is helpful in preventing a recurrence of BV.   BV may be treated with antibiotics. The antibiotics come in either pill or vaginal cream forms. Either can be used with nonpregnant or pregnant women, but the recommended dosages differ. These antibiotics are not harmful to the baby.  BV can recur after treatment. If this happens, a second round of antibiotics will often be prescribed.  Treatment is important for pregnant women. If not treated, BV can cause a premature delivery, especially for a pregnant woman who had a premature birth in the past. All pregnant women who have symptoms of BV should be checked and treated.  For chronic reoccurrence of BV, treatment with a type of prescribed gel vaginally twice a week is helpful. HOME CARE INSTRUCTIONS   Finish all medication as directed by your caregiver.  Do not have sex until treatment is completed.  Tell your sexual partner that you have a vaginal infection. They should see their caregiver and be treated if they have problems, such as a mild rash or itching.  Practice safe sex. Use condoms. Only have 1 sex partner. PREVENTION  Basic prevention steps can help reduce the risk of upsetting the natural balance of bacteria in the vagina and developing BV:  Do not have sexual intercourse (be abstinent).  Do not douche.  Use all of the medicine prescribed for treatment of BV, even if the signs and symptoms go away.  Tell your sex partner if you have BV. That way, they can be treated, if needed, to prevent reoccurrence. SEEK MEDICAL CARE IF:     Your symptoms are not improving after 3 days of treatment.  You have increased discharge, pain, or fever. MAKE SURE YOU:   Understand these instructions.  Will watch your condition.  Will get help right away if you are not doing well or get worse. FOR MORE INFORMATION  Division of STD Prevention (DSTDP), Centers for Disease Control and Prevention:  www.cdc.gov/std American Social Health Association (ASHA): www.ashastd.org  Document Released: 01/18/2005 Document Revised: 04/12/2011 Document Reviewed: 07/11/2008 [employer] Patient Information 2014 [employer].  

## 2012-10-09 NOTE — Progress Notes (Signed)
CC: Vaginal Discharge   HPI Diane Sexton is a 42 y.o. G3P0030 who presents with onset of malodorous, thin vaginal discharge present for several days. She had a similar discharge about a month ago and was diagnosed with BV. She took a course of Flagyl and finish that about 2 weeks ago. Her symptoms did resolve on Flagyl. She is sexually active without using condoms. LMP 2 wks ago. Does not keep track of menses but denies menometrorrhagia. She denies pain related to known fibroid uterus. 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 Recent mammogram negative. PMD at [facility].   Past Medical History  Diagnosis Date  . Hyperlipemia   . Depression     bi-polar  . PMDD (premenstrual dysphoric disorder)   . Hypertension     OB History  Gravida Para Term Preterm AB SAB TAB Ectopic Multiple Living  3    3  3    0    # Outcome Date GA Lbr Len/2nd Weight Sex Delivery Anes PTL Lv  3 TAB           2 TAB           1 TAB               Past Surgical History  Procedure Laterality Date  . Appendectomy  2009  . [place]  2008    History   Social History  . Marital Status: Single    Spouse Name: N/A    Number of Children: 0  . Years of Education: N/A   Occupational History  . HOUSING SPECIALIST [employer]   Social History Main Topics  . Smoking status: Never Smoker   . Smokeless tobacco: Never Used  . Alcohol Use: 0.5 oz/week    1 drink(s) per week     Comment: occasionally  . Drug Use: No  . Sexual Activity: Yes    Partners: Female, Female    Birth Control/ Protection: None   Other Topics Concern  . Not on file   Social History Narrative  . No narrative on file    Current Outpatient Prescriptions on File Prior to Visit  Medication Sig Dispense Refill  . cyclobenzaprine (FLEXERIL) 10 MG tablet Take 1 tablet (10 mg total) by mouth 2 (two) times daily as needed for muscle spasms.  20 tablet  0  . hydrochlorothiazide (HYDRODIURIL) 25  MG tablet Take 0.5 tablets (12.5 mg total) by mouth daily.  30 tablet  1   No current facility-administered medications on file prior to visit.    Allergies  Allergen Reactions  . Oxycodone Other (See Comments)    Upset stomach  . Hydrocodone-Apap-Dietary Prod Nausea And Vomiting    ROS Pertinent items in HPI  PHYSICAL EXAM Filed Vitals:   10/09/12 1256  BP: 114/75  Pulse: 80   General: Well nourished, well developed female in no acute distress Cardiovascular: Normal rate Respiratory: Normal effort Abdomen: Soft, nontender Back: No CVAT Extremities: No edema Neurologic: Alert and oriented Speculum exam: NEFG; vagina with Watery gray discharge without defined odor, no blood; cervix clean Bimanual exam: cervix closed, no CMT; uterus enlarged 10 wk size; no adnexal tenderness or masses   ASSESSMENT  1. Vaginitis   Probable BV> Rx Flagyl to fill tomorrow if WP positive. Fibroid uterus LGSIL Pap  PLAN Flagyl 500 mg po bid x7d Advised to use OTC agent to normalize vaginal pH and will consider boric  acid suppositories if recurrent vaginitis continues. F/U for Pap and recheck in 4 months    Danae Orleans, CNM 10/09/2012 2:02 PM

## 2012-10-10 ENCOUNTER — Telehealth: Payer: Self-pay

## 2012-10-10 LAB — WET PREP, GENITAL

## 2012-10-10 MED ORDER — METRONIDAZOLE 500 MG PO TABS: 500.0000 mg | ORAL_TABLET | Freq: Two times a day (BID) | ORAL | Status: AC

## 2012-10-10 NOTE — Telephone Encounter (Signed)
Pt called and stated that was seen by Leola Brazil and she was suppose to have sent in Flagyl and she drove all the way to pharmacy and they did not have it.  Called pt and pt informed me that she had talked to someone this am and they took care of it.

## 2012-10-10 NOTE — Addendum Note (Signed)
Addended by: Sherre Lain A on: 10/10/2012 08:35 AM   Modules accepted: Orders

## 2012-11-11 ENCOUNTER — Encounter: Payer: Self-pay | Admitting: Emergency Medicine

## 2012-11-11 ENCOUNTER — Emergency Department
Admission: EM | Admit: 2012-11-11 | Discharge: 2012-11-11 | Disposition: A | Discharge status: Home / Self Care | Payer: Self-pay | Source: Home / Self Care | Attending: Family Medicine | Admitting: Family Medicine

## 2012-11-11 DIAGNOSIS — J029 Acute pharyngitis, unspecified: Secondary | ICD-10-CM

## 2012-11-11 DIAGNOSIS — R590 Localized enlarged lymph nodes: Secondary | ICD-10-CM

## 2012-11-11 DIAGNOSIS — H60391 Other infective otitis externa, right ear: Secondary | ICD-10-CM

## 2012-11-11 DIAGNOSIS — M26629 Arthralgia of temporomandibular joint, unspecified side: Secondary | ICD-10-CM

## 2012-11-11 MED ORDER — MELOXICAM 15 MG PO TABS: 15.0000 mg | ORAL_TABLET | Freq: Every day | ORAL | Status: DC

## 2012-11-11 MED ORDER — METHYLPREDNISOLONE ACETATE 80 MG/ML IJ SUSP
80.0000 mg | Freq: Once | INTRAMUSCULAR | Status: AC
  Administered 2012-11-11: 80 mg via INTRAMUSCULAR

## 2012-11-11 MED ORDER — AZITHROMYCIN 250 MG PO TABS: ORAL_TABLET | ORAL | Status: DC

## 2012-11-11 MED ORDER — FLUCONAZOLE 150 MG PO TABS: 150.0000 mg | ORAL_TABLET | Freq: Once | ORAL | Status: DC

## 2012-11-11 MED ORDER — NEOMYCIN-POLYMYXIN-HC 3.5-10000-1 OT SOLN: 3.0000 [drp] | Freq: Four times a day (QID) | OTIC | Status: AC

## 2012-11-11 NOTE — ED Provider Notes (Addendum)
CSN: [id number]     Arrival date & time 11/11/12  1047 History   First MD Initiated Contact with Patient 11/11/12 1047     Chief Complaint  Patient presents with  . Hair/Scalp Problem    Scalp pain for 3 days  . Sore Throat    2 days  . Facial Swelling    2 days    HPI  Pt presents today with multiple complaints Has had R sided head pain including R sided HA, ear pain, neck swelling, pain with opening jaw over last 4 days.  Has also had some mild URI sxs rhinorrhea, post nasal drip, eye tearing.  Pt states that she had her hair braided 3 weeks ago, but feels that this is unrelated.  No recent infections.  Prior history of headaches, albeit intermittent. No photophobia or nausea. HA mild in nature.  No recent infections requiring abx.  Feels like she may have some ? Blurry vision since onset of sxs. No eye pain.  Pt does report that she grits her teeth at night, though no prior hx/o TMJ in the past.  No maxillary sinus TTP.  Some sneezing No recent sick contacts.  Eating and drinking at baseline.     Past Medical History  Diagnosis Date  . Hyperlipemia   . Depression     bi-polar  . PMDD (premenstrual dysphoric disorder)   . Hypertension    Past Surgical History  Procedure Laterality Date  . Appendectomy  2009  . [place]  2008   Family History  Problem Relation Age of Onset  . Hypertension Mother   . Heart attack Mother 48    light MI  . Hypertension Father   . Diabetes Father   . Diabetes Mellitus II Father   . Diabetes Mellitus II Paternal Aunt   . Diabetes Mellitus II Paternal Uncle   . Dementia Maternal Grandmother   . Diabetes Mellitus II Paternal Uncle   . Diabetes Mellitus II Paternal Aunt    History  Substance Use Topics  . Smoking status: Never Smoker   . Smokeless tobacco: Never Used  . Alcohol Use: 0.5 oz/week    1 drink(s) per week     Comment: occasionally   OB History   Grav Para Term Preterm Abortions TAB SAB Ect Mult Living   3    3 3      0     Review of Systems  Allergies  Oxycodone and Hydrocodone-apap-dietary prod  Home Medications   Current Outpatient Rx  Name  Route  Sig  Dispense  Refill  . cyclobenzaprine (FLEXERIL) 10 MG tablet   Oral   Take 1 tablet (10 mg total) by mouth 2 (two) times daily as needed for muscle spasms.   20 tablet   0   . hydrochlorothiazide (HYDRODIURIL) 25 MG tablet   Oral   Take 0.5 tablets (12.5 mg total) by mouth daily.   30 tablet   1    BP 132/87  Pulse 80  Temp(Src) 98.1 F (36.7 C) (Oral)  Ht 5' 5.5" (1.664 m)  Wt 182 lb (82.555 kg)  BMI 29.82 kg/m2  SpO2 98% Physical Exam  Constitutional: She appears well-developed and well-nourished.  HENT:  Head: Normocephalic and atraumatic.  R canal mild erythema and tenderness to otoscopic evaluation. No TM bulging or erythema  + TMJ TTP, + pain with jaw opening, minimal swelling.  No intraoral lesions present.  No drooling or trismus.  No meningismus   Eyes: Conjunctivae are  normal. Pupils are equal, round, and reactive to light.  Neck: Normal range of motion.  Mild R sided cervical LAD    Cardiovascular: Normal rate and regular rhythm.   Pulmonary/Chest: Effort normal and breath sounds normal.  Abdominal: Soft.  Musculoskeletal: Normal range of motion.  Neurological: She is alert.  Skin: Skin is warm.    ED Course  Procedures (including critical care time) Labs Review Labs Reviewed  POCT RAPID STREP A (OFFICE) - Normal   Imaging Review No results found.    MDM   1. Sore throat   2. Otitis, externa, infective, right   3. Cervical lymphadenopathy   4. TMJ tenderness    DDX for sxs is fairly broad, though overall sxsmay likely have been triggered by viral source. TMJ more likely given exam and history.  DDx also includes, trigeminal neuralgia, cluster headache-though being somewhat lower on ddx. These sxs have been fairly recurrent issues in review of pt's chart.  Cortisporin for OE Depomedrol for ? LAD  and R sided pain. Rapid strep negative. Airway intact with no red flags on exam.  Discussed psych red flags with pt given steroid use in setting of prior hx/o bipolar d/o. No HI/SI currently. Mood stable.  Zpak for atypical coverage.  Diflucan at pt's request.  Discussed infectious and ENT red flags with pt at length.  Follow up with PCP/ENT if sxs persist despite treatment.     The patient and/or caregiver has been counseled thoroughly with regard to treatment plan and/or medications prescribed including dosage, schedule, interactions, rationale for use, and possible side effects and they verbalize understanding. Diagnoses and expected course of recovery discussed and will return if not improved as expected or if the condition worsens. Patient and/or caregiver verbalized understanding.        Doree Albee, MD 11/11/12 1231  Doree Albee, MD 11/11/12 1236

## 2012-11-11 NOTE — ED Notes (Signed)
Diane Sexton has scalp pain and facial swelling for 3 days. Other symptoms include: eye drainage, ear pain, sore throat, sinus problems and hoarseness.

## 2012-11-22 ENCOUNTER — Ambulatory Visit (INDEPENDENT_AMBULATORY_CARE_PROVIDER_SITE_OTHER): Payer: Self-pay | Admitting: Obstetrics & Gynecology

## 2012-11-22 ENCOUNTER — Encounter: Payer: Self-pay | Admitting: Obstetrics & Gynecology

## 2012-11-22 VITALS — BP 126/88 | HR 104 | Temp 98.5°F | Ht 65.5 in | Wt 177.0 lb

## 2012-11-22 DIAGNOSIS — R87612 Low grade squamous intraepithelial lesion on cytologic smear of cervix (LGSIL): Secondary | ICD-10-CM

## 2012-11-22 NOTE — Progress Notes (Signed)
  Subjective:    Patient ID: Diane Sexton, female    DOB: 13-Mar-1970, 42 y.o.   MRN: [id number]  HPI  She is here for a repeat pap. She has had LGSIL for years. She had a colpo with biopsy that confirmed LGSIL. She is not a smoker. She declines a flu vaccine today.   Review of Systems She is now in a monogamous relationship with a man (previously in a same-sex relationship)    Objective:   Physical Exam  Normal appearing cervix, vagina, and discharge      Assessment & Plan:  LGSIL- pap today

## 2012-12-07 ENCOUNTER — Telehealth: Payer: Self-pay | Admitting: *Deleted

## 2012-12-07 NOTE — Telephone Encounter (Signed)
Pt called nurse line requesting results for Pap smear, gave permission for results to be left on voicemail.  Message left for pt regarding test results for pap and information that we have asked Dr. Marice Potter how she wants to proceed.

## 2012-12-12 ENCOUNTER — Telehealth: Payer: Self-pay | Admitting: *Deleted

## 2012-12-12 NOTE — Telephone Encounter (Signed)
Pt called nurse line desiring to know what Dr. Ellin Saba advice for how to proceed.

## 2012-12-12 NOTE — Telephone Encounter (Addendum)
Called pt and informed her that we have not had a response from Dr. Marice Potter as to how to proceed after the LSIL Pap result. She will be in the clinic tomorrow and I will speak to her about the plan of care. Diane Sexton voiced understanding and stated that a detailed message may be left on her voice mail if she does not answer.  11/12  1506  Consult with Dr. Marice Potter regarding plan of care. She stated that pt will need pap w/cotesting in 1 year. I called pt and left message on her personal voice mail and included information from Dr. Marice Potter.

## 2012-12-19 NOTE — Telephone Encounter (Signed)
12/19/2012  Spoke with Dr. Jolayne Panther, she recommended another colposcopy.  Appt made.  Called patient and informed her of physician's advice and scheduled appt for Dec. 17, 2014 @ 1415.  Pt verbalizes understanding.

## 2012-12-20 ENCOUNTER — Telehealth: Payer: Self-pay | Admitting: *Deleted

## 2012-12-20 DIAGNOSIS — B9689 Other specified bacterial agents as the cause of diseases classified elsewhere: Secondary | ICD-10-CM

## 2012-12-20 MED ORDER — METRONIDAZOLE 500 MG PO TABS: 500.0000 mg | ORAL_TABLET | Freq: Two times a day (BID) | ORAL | Status: DC

## 2012-12-20 NOTE — Telephone Encounter (Signed)
Pt states that she has symptoms of bv. Fishy odor and thin discharge. I advised patient that I will send in flagyl per protocol. Pt agrees and will follow up if needed.

## 2012-12-21 ENCOUNTER — Encounter: Payer: Self-pay | Admitting: Family Medicine

## 2012-12-21 ENCOUNTER — Ambulatory Visit (INDEPENDENT_AMBULATORY_CARE_PROVIDER_SITE_OTHER): Payer: Self-pay | Admitting: Family Medicine

## 2012-12-21 VITALS — BP 136/85 | HR 76 | Temp 98.1°F | Wt 176.0 lb

## 2012-12-21 DIAGNOSIS — N898 Other specified noninflammatory disorders of vagina: Secondary | ICD-10-CM

## 2012-12-21 MED ORDER — FLUCONAZOLE 150 MG PO TABS: 150.0000 mg | ORAL_TABLET | Freq: Once | ORAL | Status: DC

## 2012-12-21 NOTE — Patient Instructions (Signed)
Please take your antibiotics as prescribed for BV. If you have itching or other signs of yeast infections afterward you can take the fluconazole that I sent in.  To prevent further infections your boyfriend can wash himself in a very dilute bleach solution. You can also start eating yogurt. Please continue to avoid douching and harsh soaps.  Thank you,  Dr. Richarda Blade

## 2012-12-21 NOTE — Assessment & Plan Note (Signed)
Presumed BV based on symptoms and previous recurrences - treated empirically by obgyn with metronidazole - sent in fluconazole prn yeast infection after antibiotics - recommend avoiding douching, good hygiene by partner, no harsh soaps, eat yogurt

## 2012-12-21 NOTE — Progress Notes (Signed)
  Subjective:    Patient ID: Diane Sexton, female    DOB: March 20, 1970, 42 y.o.   MRN: [id number]  HPI Pt presents for fishy vaginal odor and discharge. She was given metronidazole by her obgyn yesterday but wanted to come in to discuss why she keeps having recurrent infections. Her symptoms started earlier this week. She has also had some itching and a thin grayish discharge. She has no new sexual partners and has been with her boyfriend for >1 year who she believes to be faithful. She has been diagnosed with BV 3 times in the last 4 months and previously only had a few infections many years apart.   Review of Systems  Genitourinary: Positive for vaginal discharge. Negative for dysuria, urgency, frequency, flank pain, vaginal pain, menstrual problem, pelvic pain and dyspareunia.  All other systems reviewed and are negative.       Objective:   Physical Exam  Nursing note and vitals reviewed. Constitutional: She is oriented to person, place, and time. She appears well-developed and well-nourished. No distress.  HENT:  Head: Normocephalic and atraumatic.  Right Ear: External ear normal.  Left Ear: External ear normal.  Nose: Nose normal.  Eyes: Conjunctivae are normal. Right eye exhibits no discharge. Left eye exhibits no discharge. No scleral icterus.  Pulmonary/Chest: Effort normal.  Neurological: She is alert and oriented to person, place, and time.  Skin: Skin is warm and dry. No rash noted. She is not diaphoretic.  Psychiatric: She has a normal mood and affect. Her behavior is normal. Judgment and thought content normal.          Assessment & Plan:

## 2013-01-01 ENCOUNTER — Other Ambulatory Visit: Payer: Self-pay | Admitting: Family Medicine

## 2013-01-01 ENCOUNTER — Telehealth: Payer: Self-pay | Admitting: Family Medicine

## 2013-01-01 DIAGNOSIS — B9689 Other specified bacterial agents as the cause of diseases classified elsewhere: Secondary | ICD-10-CM

## 2013-01-01 MED ORDER — METRONIDAZOLE 500 MG PO TABS: 500.0000 mg | ORAL_TABLET | Freq: Two times a day (BID) | ORAL | Status: DC

## 2013-01-01 NOTE — Telephone Encounter (Signed)
I sent a refill to her pharmacy. Please tell her this and ask her to return to clinic if her symptoms don't resolve with this second course.

## 2013-01-01 NOTE — Telephone Encounter (Signed)
Will fwd. To Dr.Adamo for review. .Diane Sexton  

## 2013-01-01 NOTE — Telephone Encounter (Signed)
Pt notified.  Bushra Denman L, CMA  

## 2013-01-01 NOTE — Telephone Encounter (Signed)
Pt called and would like a refill on her flagyl. She drank alcohol this weekend and knows she wasn't supposed to. She said her symptoms have come back and she only has 4 pills left. jw

## 2013-01-17 ENCOUNTER — Encounter: Payer: Self-pay | Admitting: Obstetrics & Gynecology

## 2013-01-17 ENCOUNTER — Ambulatory Visit (INDEPENDENT_AMBULATORY_CARE_PROVIDER_SITE_OTHER): Payer: Self-pay | Admitting: Obstetrics & Gynecology

## 2013-01-17 VITALS — BP 134/91 | HR 91 | Temp 97.9°F | Wt 177.4 lb

## 2013-01-17 DIAGNOSIS — Z01812 Encounter for preprocedural laboratory examination: Secondary | ICD-10-CM

## 2013-01-17 DIAGNOSIS — R87612 Low grade squamous intraepithelial lesion on cytologic smear of cervix (LGSIL): Secondary | ICD-10-CM

## 2013-01-17 LAB — POCT PREGNANCY, URINE: Preg Test, Ur: NEGATIVE

## 2013-01-17 NOTE — Progress Notes (Signed)
   Subjective:    Patient ID: Diane Sexton, female    DOB: 1970/09/09, 42 y.o.   MRN: [id number]  HPI    Review of Systems     Objective:   Physical Exam        Assessment & Plan:  She is here today for a repeat colpo due to recurrent LGSIL. But after I reviewed her results, it seems that she had a negative ECC and cervical biopsy that matched LGSIL. I don't feel that another colposcopy is warranted. I did discuss with her the option of cryotherapy. She prefers to repeat a pap in a year and if it is still LGSIL, then she will consider cryo. She also had unprotected sex recently. I told her that there would be no charge for today's visit.

## 2013-01-18 ENCOUNTER — Other Ambulatory Visit: Payer: Self-pay | Admitting: Family Medicine

## 2013-01-18 DIAGNOSIS — Z1231 Encounter for screening mammogram for malignant neoplasm of breast: Secondary | ICD-10-CM

## 2013-01-19 ENCOUNTER — Telehealth: Payer: Self-pay | Admitting: Family Medicine

## 2013-01-19 ENCOUNTER — Other Ambulatory Visit: Payer: Self-pay | Admitting: Family Medicine

## 2013-01-19 DIAGNOSIS — I10 Essential (primary) hypertension: Secondary | ICD-10-CM

## 2013-01-19 MED ORDER — HYDROCHLOROTHIAZIDE 12.5 MG PO TABS: 12.5000 mg | ORAL_TABLET | Freq: Every day | ORAL | Status: DC

## 2013-01-19 NOTE — Telephone Encounter (Signed)
Will fwd  to pcp

## 2013-01-19 NOTE — Telephone Encounter (Signed)
LMVM to call back. Lorenda Hatchet, Renato Battles

## 2013-01-19 NOTE — Telephone Encounter (Signed)
HCTZ refill sent to pharmacy, please inform patient

## 2013-01-19 NOTE — Telephone Encounter (Signed)
Out of high blood pressure meds Please refill [facility] on [address] Please call when it is faxed over to pharmacy

## 2013-01-22 NOTE — Telephone Encounter (Signed)
Pt never called back. .Diane Sexton  

## 2013-02-20 ENCOUNTER — Encounter: Payer: Self-pay | Admitting: Cardiology

## 2013-02-27 ENCOUNTER — Ambulatory Visit: Payer: Self-pay

## 2013-03-24 ENCOUNTER — Emergency Department (HOSPITAL_COMMUNITY)
Admission: EM | Admit: 2013-03-24 | Discharge: 2013-03-24 | Disposition: A | Discharge status: Home / Self Care | Payer: No Typology Code available for payment source | Attending: Emergency Medicine | Admitting: Emergency Medicine

## 2013-03-24 ENCOUNTER — Encounter (HOSPITAL_COMMUNITY): Payer: Self-pay | Admitting: Emergency Medicine

## 2013-03-24 ENCOUNTER — Emergency Department (HOSPITAL_COMMUNITY): Payer: No Typology Code available for payment source

## 2013-03-24 DIAGNOSIS — Y929 Unspecified place or not applicable: Secondary | ICD-10-CM | POA: Insufficient documentation

## 2013-03-24 DIAGNOSIS — Z8659 Personal history of other mental and behavioral disorders: Secondary | ICD-10-CM | POA: Insufficient documentation

## 2013-03-24 DIAGNOSIS — X500XXA Overexertion from strenuous movement or load, initial encounter: Secondary | ICD-10-CM | POA: Insufficient documentation

## 2013-03-24 DIAGNOSIS — Z79899 Other long term (current) drug therapy: Secondary | ICD-10-CM | POA: Insufficient documentation

## 2013-03-24 DIAGNOSIS — Z862 Personal history of diseases of the blood and blood-forming organs and certain disorders involving the immune mechanism: Secondary | ICD-10-CM | POA: Insufficient documentation

## 2013-03-24 DIAGNOSIS — S93401A Sprain of unspecified ligament of right ankle, initial encounter: Secondary | ICD-10-CM

## 2013-03-24 DIAGNOSIS — Y939 Activity, unspecified: Secondary | ICD-10-CM | POA: Insufficient documentation

## 2013-03-24 DIAGNOSIS — I10 Essential (primary) hypertension: Secondary | ICD-10-CM | POA: Insufficient documentation

## 2013-03-24 DIAGNOSIS — W19XXXA Unspecified fall, initial encounter: Secondary | ICD-10-CM

## 2013-03-24 DIAGNOSIS — W108XXA Fall (on) (from) other stairs and steps, initial encounter: Secondary | ICD-10-CM | POA: Insufficient documentation

## 2013-03-24 DIAGNOSIS — R269 Unspecified abnormalities of gait and mobility: Secondary | ICD-10-CM | POA: Insufficient documentation

## 2013-03-24 DIAGNOSIS — S93409A Sprain of unspecified ligament of unspecified ankle, initial encounter: Secondary | ICD-10-CM | POA: Insufficient documentation

## 2013-03-24 DIAGNOSIS — Z8639 Personal history of other endocrine, nutritional and metabolic disease: Secondary | ICD-10-CM | POA: Insufficient documentation

## 2013-03-24 MED ORDER — HYDROCODONE-ACETAMINOPHEN 5-325 MG PO TABS
2.0000 | ORAL_TABLET | Freq: Once | ORAL | Status: AC
  Administered 2013-03-24: 2 via ORAL
  Filled 2013-03-24: qty 2

## 2013-03-24 MED ORDER — HYDROCODONE-ACETAMINOPHEN 5-325 MG PO TABS: 2.0000 | ORAL_TABLET | ORAL | Status: DC | PRN

## 2013-03-24 NOTE — ED Notes (Signed)
Patient transported to X-Ray 

## 2013-03-24 NOTE — ED Notes (Signed)
Pt states she missed last two steps and fell; Heard a pop and had sudden pain in Right foot which has moved up to mid leg.

## 2013-03-24 NOTE — Discharge Instructions (Signed)
Take ibuprofen every 6 hrs and tylenol every 4 hrs as needed for pain. Elevated and ice ankle regularly.  If you were given medicines take as directed.  If you are on coumadin or contraceptives realize their levels and effectiveness is altered by many different medicines.  If you have any reaction (rash, tongues swelling, other) to the medicines stop taking and see a physician.   Please follow up as directed and return to the ER or see a physician for new or worsening symptoms.  Thank you.  Ankle Sprain An ankle sprain is an injury to the strong, fibrous tissues (ligaments) that hold the bones of your ankle joint together.  CAUSES An ankle sprain is usually caused by a fall or by twisting your ankle. Ankle sprains most commonly occur when you step on the outer edge of your foot, and your ankle turns inward. People who participate in sports are more prone to these types of injuries.  SYMPTOMS   Pain in your ankle. The pain may be present at rest or only when you are trying to stand or walk.  Swelling.  Bruising. Bruising may develop immediately or within 1 to 2 days after your injury.  Difficulty standing or walking, particularly when turning corners or changing directions. DIAGNOSIS  Your caregiver will ask you details about your injury and perform a physical exam of your ankle to determine if you have an ankle sprain. During the physical exam, your caregiver will press on and apply pressure to specific areas of your foot and ankle. Your caregiver will try to move your ankle in certain ways. An X-ray exam may be done to be sure a bone was not broken or a ligament did not separate from one of the bones in your ankle (avulsion fracture).  TREATMENT  Certain types of braces can help stabilize your ankle. Your caregiver can make a recommendation for this. Your caregiver may recommend the use of medicine for pain. If your sprain is severe, your caregiver may refer you to a surgeon who helps to  restore function to parts of your skeletal system (orthopedist) or a physical therapist. [place] ice to your injury for 1 2 days or as directed by your caregiver. Applying ice helps to reduce inflammation and pain.  Put ice in a plastic bag.  Place a towel between your skin and the bag.  Leave the ice on for 15-20 minutes at a time, every 2 hours while you are awake.  Only take over-the-counter or prescription medicines for pain, discomfort, or fever as directed by your caregiver.  Elevate your injured ankle above the level of your heart as much as possible for 2 3 days.  If your caregiver recommends crutches, use them as instructed. Gradually put weight on the affected ankle. Continue to use crutches or a cane until you can walk without feeling pain in your ankle.  If you have a plaster splint, wear the splint as directed by your caregiver. Do not rest it on anything harder than a pillow for the first 24 hours. Do not put weight on it. Do not get it wet. You may take it off to take a shower or bath.  You may have been given an elastic bandage to wear around your ankle to provide support. If the elastic bandage is too tight (you have numbness or tingling in your foot or your foot becomes cold and blue), adjust the bandage to make it comfortable.  If you have  an air splint, you may blow more air into it or let air out to make it more comfortable. You may take your splint off at night and before taking a shower or bath. Wiggle your toes in the splint several times per day to decrease swelling. SEEK MEDICAL CARE IF:   You have rapidly increasing bruising or swelling.  Your toes feel extremely cold or you lose feeling in your foot.  Your pain is not relieved with medicine. SEEK IMMEDIATE MEDICAL CARE IF:  Your toes are numb or blue.  You have severe pain that is increasing. MAKE SURE YOU:   Understand these instructions.  Will watch your condition.  Will get  help right away if you are not doing well or get worse. Document Released: 01/18/2005 Document Revised: 10/13/2011 Document Reviewed: 01/30/2011 [facility] Patient Information 2014 [place], [place].

## 2013-03-24 NOTE — ED Provider Notes (Signed)
CSN: [id number]     Arrival date & time 03/24/13  6295 History   First MD Initiated Contact with Patient 03/24/13 0354     Chief Complaint  Patient presents with  . Fall     (Consider location/radiation/quality/duration/timing/severity/associated sxs/prior Treatment) HPI Comments: 43 yo female with arthritis, bipolar presents with right foot/ ankle pain since this evening after missing last two steps of stairs, no other injuries, pain with rom, felt pop and sudden pain.  No ankle surgery hx.  No sxs prior to fall.  Patient is a 43 y.o. female presenting with fall. The history is provided by the patient.  Fall This is a new problem.    Past Medical History  Diagnosis Date  . Hyperlipemia   . Depression     bi-polar  . PMDD (premenstrual dysphoric disorder)   . Hypertension    Past Surgical History  Procedure Laterality Date  . Appendectomy  2009  . [place]  2008   Family History  Problem Relation Age of Onset  . Hypertension Mother   . Heart attack Mother 48    light MI  . Hypertension Father   . Diabetes Father   . Diabetes Mellitus II Father   . Diabetes Mellitus II Paternal Aunt   . Diabetes Mellitus II Paternal Uncle   . Dementia Maternal Grandmother   . Diabetes Mellitus II Paternal Uncle   . Diabetes Mellitus II Paternal Aunt    History  Substance Use Topics  . Smoking status: Never Smoker   . Smokeless tobacco: Never Used  . Alcohol Use: 0.5 oz/week    1 drink(s) per week     Comment: occasionally   OB History   Grav Para Term Preterm Abortions TAB SAB Ect Mult Living   3    3 3     0     Review of Systems  Musculoskeletal: Positive for arthralgias, gait problem and joint swelling.  Skin: Negative for color change.  Neurological: Negative for weakness and numbness.      Allergies  Oxycodone and Hydrocodone-apap-dietary prod  Home Medications   Current Outpatient Rx  Name  Route  Sig  Dispense  Refill  . cyclobenzaprine (FLEXERIL) 10 MG tablet   Oral   Take 1 tablet (10 mg total) by mouth 2 (two) times daily as needed for muscle spasms.   20 tablet   0   . hydrochlorothiazide (HYDRODIURIL) 12.5 MG tablet   Oral   Take 1 tablet (12.5 mg total) by mouth daily.   30 tablet   11    BP 134/83  Temp(Src) 97.7 F (36.5 C) (Oral)  Resp 18  Ht 5' 5.5" (1.664 m)  Wt 175 lb (79.379 kg)  BMI 28.67 kg/m2  SpO2 96% Physical Exam  Nursing note and vitals reviewed. Constitutional: She appears well-developed and well-nourished. No distress.  Musculoskeletal: She exhibits edema and tenderness.  Tender right foot, lateral aspect of ankle and midfoot, mild swelling, nv intact, pain with all rom  Neurological: She is alert.    ED Course  Procedures (including critical care time) Labs Review Labs Reviewed - No data to display Imaging Review Dg Ankle Complete Right  03/24/2013   CLINICAL DATA:  Status post fall; pain and swelling about the lateral malleolus.  EXAM: RIGHT ANKLE - COMPLETE 3+ VIEW  COMPARISON:  None.  FINDINGS: There is no evidence of fracture or dislocation. The ankle mortise is intact; the interosseous space is within normal limits. No talar tilt or subluxation  is seen. A tiny plantar calcaneal spur is incidentally seen.  The joint spaces are preserved. No significant soft tissue abnormalities are seen.  IMPRESSION: No evidence of fracture or dislocation.   Electronically Signed   By: Garald Balding M.D.   On: 03/24/2013 05:38   Dg Foot Complete Right  03/24/2013   CLINICAL DATA:  Status post fall; pain and swelling along the lateral malleolus.  EXAM: RIGHT FOOT COMPLETE - 3+ VIEW  COMPARISON:  None.  FINDINGS: There is no evidence of fracture or dislocation. The joint spaces are preserved. There is no evidence of talar subluxation; the subtalar joint is unremarkable in appearance. A tiny plantar calcaneal spur is incidentally seen.  No significant soft tissue abnormalities are seen.  IMPRESSION: No evidence of fracture or  dislocation.   Electronically Signed   By: Garald Balding M.D.   On: 03/24/2013 05:38    EKG Interpretation   None       MDM   Final diagnoses:  None    Concern for ankle sprain vs fx. NSAID sensitivity. Norco and ice. Xrays reviewed, no acute fx.  Results and differential diagnosis were discussed with the patient. Close follow up outpatient was discussed, patient comfortable with the plan.     Mariea Clonts, MD 03/24/13 [phone]

## 2013-03-27 ENCOUNTER — Ambulatory Visit: Payer: Self-pay | Admitting: Family Medicine

## 2013-04-10 ENCOUNTER — Ambulatory Visit: Payer: Self-pay | Admitting: Family Medicine

## 2013-04-24 ENCOUNTER — Ambulatory Visit (INDEPENDENT_AMBULATORY_CARE_PROVIDER_SITE_OTHER): Payer: No Typology Code available for payment source

## 2013-04-24 ENCOUNTER — Ambulatory Visit: Payer: Self-pay

## 2013-04-24 DIAGNOSIS — Z1231 Encounter for screening mammogram for malignant neoplasm of breast: Secondary | ICD-10-CM

## 2013-04-24 DIAGNOSIS — R928 Other abnormal and inconclusive findings on diagnostic imaging of breast: Secondary | ICD-10-CM

## 2013-04-26 ENCOUNTER — Other Ambulatory Visit: Payer: Self-pay | Admitting: Obstetrics & Gynecology

## 2013-04-26 DIAGNOSIS — R928 Other abnormal and inconclusive findings on diagnostic imaging of breast: Secondary | ICD-10-CM

## 2013-05-02 ENCOUNTER — Encounter: Payer: Self-pay | Admitting: Emergency Medicine

## 2013-05-02 ENCOUNTER — Ambulatory Visit (INDEPENDENT_AMBULATORY_CARE_PROVIDER_SITE_OTHER): Payer: No Typology Code available for payment source | Admitting: Emergency Medicine

## 2013-05-02 ENCOUNTER — Ambulatory Visit: Payer: Self-pay | Admitting: Family Medicine

## 2013-05-02 ENCOUNTER — Telehealth: Payer: Self-pay | Admitting: Family Medicine

## 2013-05-02 VITALS — BP 123/83 | HR 91 | Temp 97.6°F | Ht 65.5 in | Wt 176.0 lb

## 2013-05-02 DIAGNOSIS — A499 Bacterial infection, unspecified: Secondary | ICD-10-CM

## 2013-05-02 DIAGNOSIS — N760 Acute vaginitis: Secondary | ICD-10-CM

## 2013-05-02 DIAGNOSIS — B9689 Other specified bacterial agents as the cause of diseases classified elsewhere: Secondary | ICD-10-CM

## 2013-05-02 DIAGNOSIS — N898 Other specified noninflammatory disorders of vagina: Secondary | ICD-10-CM

## 2013-05-02 LAB — POCT WET PREP (WET MOUNT): CLUE CELLS WET PREP WHIFF POC: POSITIVE

## 2013-05-02 MED ORDER — METRONIDAZOLE 500 MG PO TABS: 500.0000 mg | ORAL_TABLET | Freq: Two times a day (BID) | ORAL | Status: DC

## 2013-05-02 NOTE — Telephone Encounter (Signed)
Pt called and needs a refill on her Flagyl called in. jw

## 2013-05-02 NOTE — Progress Notes (Signed)
   Subjective:    Patient ID: Diane Sexton, female    DOB: Oct 25, 1970, 43 y.o.   MRN: [id number]  HPI Diane Sexton is here for a SDA for vaginal odor.  She reports a vaginal odor, fishy, for the last 5 days.  Also with some mild itching and discharge.  No vaginal pain.  No pelvic pain.  No fevers.  No new sexual partners and her long-term partner denies any other sexual interactions.  She states that this is the 4-5 time this has happening in the last year.  Has noticed that it typically occurs after her period.  Last BV was in 01/2013.  She does state that for her period on 3/19 she used scented pads which is unusual for her.  She uses a Programme researcher, broadcasting/film/video.  She does not douche and washes with mild Dove or Oil of Olay soap only.  She denies any changes in her diet in the last month.  Current Outpatient Prescriptions on File Prior to Visit  Medication Sig Dispense Refill  . cyclobenzaprine (FLEXERIL) 10 MG tablet Take 1 tablet (10 mg total) by mouth 2 (two) times daily as needed for muscle spasms.  20 tablet  0  . hydrochlorothiazide (HYDRODIURIL) 12.5 MG tablet Take 1 tablet (12.5 mg total) by mouth daily.  30 tablet  11  . HYDROcodone-acetaminophen (NORCO) 5-325 MG per tablet Take 2 tablets by mouth every 4 (four) hours as needed.  6 tablet  0   No current facility-administered medications on file prior to visit.    I have reviewed and updated the following as appropriate: allergies and current medications SHx: non smoker  Health Maintenance: paps followed by GYN   Review of Systems See HPI    Objective:   Physical Exam BP 123/83  Pulse 91  Temp(Src) 97.6 F (36.4 C) (Oral)  Ht 5' 5.5" (1.664 m)  Wt 176 lb (79.833 kg)  BMI 28.83 kg/m2  LMP 04/19/2013 Gen: alert, cooperative, NAD Pelvic: normal external genitalia; normal vagina; mild thin white discharge present; normal cervix; no CMT, no adnexal fullness or tenderness, uterus slightly  enlarged consistent with diagnosis of fibroids      Assessment & Plan:

## 2013-05-02 NOTE — Patient Instructions (Signed)
It was nice to meet you!  We will treat with a course of Flagyl.  Take 1 pill twice a day for 7 days.  Please avoid any scents or dyes in any personal hygiene products or soaps/detergents.   Buy underwear that is plain white cotton. No douching. Pay attention to your diet and see if there is something that you eat that seems to trigger these infections.  Follow up with your regular doctor as needed.

## 2013-05-02 NOTE — Assessment & Plan Note (Addendum)
Wet prep shows clue cells. Discussed conservative measures including white cotton underwear and no scents or dyes in soaps/detergents. Will treat with flagyl 500mg  BID x7 days; 2 refills given as she has had at least 3 confirmed BV infections in the last 6 months. Follow up as needed with PCP.

## 2013-05-02 NOTE — Telephone Encounter (Signed)
Pt notified that she needs an OV to be seen before meds called in.  Pt agreeable, but wants to be seen this week.  I instructed patient to call back and schedule an appt with Dr. Sherril Cong and if she is not available, then Red Team MD.  Pt verbalized understanding.  Diane Sexton, Loralyn Freshwater, [place]

## 2013-05-14 ENCOUNTER — Encounter: Payer: Self-pay | Admitting: Obstetrics & Gynecology

## 2013-05-14 ENCOUNTER — Ambulatory Visit (INDEPENDENT_AMBULATORY_CARE_PROVIDER_SITE_OTHER): Payer: No Typology Code available for payment source | Admitting: Obstetrics & Gynecology

## 2013-05-14 VITALS — BP 132/84 | HR 73 | Temp 98.3°F | Ht 65.5 in | Wt 176.2 lb

## 2013-05-14 DIAGNOSIS — Z01812 Encounter for preprocedural laboratory examination: Secondary | ICD-10-CM

## 2013-05-14 LAB — POCT PREGNANCY, URINE: Preg Test, Ur: NEGATIVE

## 2013-05-14 NOTE — Progress Notes (Signed)
Here for c/o pain from her fibroids, and also having 2 heavy periods in a month with big blood clots.

## 2013-05-15 ENCOUNTER — Ambulatory Visit: Payer: Self-pay | Admitting: Family Medicine

## 2013-05-15 ENCOUNTER — Other Ambulatory Visit: Payer: Self-pay

## 2013-05-16 ENCOUNTER — Ambulatory Visit: Payer: Self-pay | Admitting: Obstetrics & Gynecology

## 2013-05-21 NOTE — Progress Notes (Signed)
   Subjective:    Patient ID: Diane Sexton, female    DOB: 1970/08/16, 43 y.o.   MRN: [id number]  HPI  She left and rescheduled when she found out that the wait would be more than 30 minutes.  Review of Systems     Objective:   Physical Exam        Assessment & Plan:

## 2013-05-23 ENCOUNTER — Ambulatory Visit
Admission: RE | Admit: 2013-05-23 | Discharge: 2013-05-23 | Disposition: A | Discharge status: Home / Self Care | Payer: No Typology Code available for payment source | Source: Ambulatory Visit | Attending: Obstetrics & Gynecology | Admitting: Obstetrics & Gynecology

## 2013-05-23 ENCOUNTER — Ambulatory Visit: Payer: Self-pay | Admitting: Family Medicine

## 2013-05-23 ENCOUNTER — Ambulatory Visit: Payer: Self-pay | Admitting: Obstetrics & Gynecology

## 2013-05-23 DIAGNOSIS — R928 Other abnormal and inconclusive findings on diagnostic imaging of breast: Secondary | ICD-10-CM

## 2013-12-03 ENCOUNTER — Encounter: Payer: Self-pay | Admitting: Obstetrics & Gynecology

## 2014-02-04 ENCOUNTER — Other Ambulatory Visit: Payer: Self-pay | Admitting: Family Medicine

## 2014-02-04 DIAGNOSIS — I10 Essential (primary) hypertension: Secondary | ICD-10-CM

## 2014-02-04 MED ORDER — HYDROCHLOROTHIAZIDE 12.5 MG PO TABS: 12.5000 mg | ORAL_TABLET | Freq: Every day | ORAL | Status: DC

## 2014-02-04 NOTE — Telephone Encounter (Signed)
Has moved to [place] and doesn't have dr yet Her HCTZ has run out. [facility] says dr must renew it [facility]  [phone]

## 2015-06-02 ENCOUNTER — Other Ambulatory Visit: Payer: Self-pay | Admitting: Family Medicine

## 2015-06-02 DIAGNOSIS — I10 Essential (primary) hypertension: Secondary | ICD-10-CM

## 2016-02-02 DIAGNOSIS — Z8619 Personal history of other infectious and parasitic diseases: Secondary | ICD-10-CM

## 2016-02-02 HISTORY — DX: Personal history of other infectious and parasitic diseases: Z86.19

## 2016-06-08 ENCOUNTER — Telehealth: Payer: Self-pay | Admitting: *Deleted

## 2016-06-08 MED ORDER — FLUCONAZOLE 150 MG PO TABS: 150.0000 mg | ORAL_TABLET | Freq: Once | ORAL | 0 refills | Status: AC

## 2016-06-08 NOTE — Telephone Encounter (Signed)
Pt left message yesterday stating that she is a patient of Dr. Hulan Fray.  She feels she has a yeast infection and requests Rx of Diflucan. Her pharmacy has changed. I called pt and advised that Dr. Hulan Fray has approved the requested medication.  I also stated to pt that she has not been seen in over 3 years.  She stated that she had moved to [facility] for a couple years but is now back in the area.  Her last Pap was 03/2015.  She will call the appt line to schedule Annual Gyn exam.

## 2016-08-15 ENCOUNTER — Encounter (HOSPITAL_BASED_OUTPATIENT_CLINIC_OR_DEPARTMENT_OTHER): Payer: Self-pay | Admitting: *Deleted

## 2016-08-15 ENCOUNTER — Emergency Department (HOSPITAL_BASED_OUTPATIENT_CLINIC_OR_DEPARTMENT_OTHER)
Admission: EM | Admit: 2016-08-15 | Discharge: 2016-08-15 | Disposition: A | Discharge status: Home / Self Care | Payer: No Typology Code available for payment source | Attending: Emergency Medicine | Admitting: Emergency Medicine

## 2016-08-15 ENCOUNTER — Emergency Department (HOSPITAL_BASED_OUTPATIENT_CLINIC_OR_DEPARTMENT_OTHER): Payer: No Typology Code available for payment source

## 2016-08-15 DIAGNOSIS — Z79899 Other long term (current) drug therapy: Secondary | ICD-10-CM | POA: Insufficient documentation

## 2016-08-15 DIAGNOSIS — W0110XA Fall on same level from slipping, tripping and stumbling with subsequent striking against unspecified object, initial encounter: Secondary | ICD-10-CM | POA: Insufficient documentation

## 2016-08-15 DIAGNOSIS — Y939 Activity, unspecified: Secondary | ICD-10-CM | POA: Insufficient documentation

## 2016-08-15 DIAGNOSIS — S29012A Strain of muscle and tendon of back wall of thorax, initial encounter: Secondary | ICD-10-CM | POA: Insufficient documentation

## 2016-08-15 DIAGNOSIS — Y929 Unspecified place or not applicable: Secondary | ICD-10-CM | POA: Insufficient documentation

## 2016-08-15 DIAGNOSIS — S39012A Strain of muscle, fascia and tendon of lower back, initial encounter: Secondary | ICD-10-CM | POA: Insufficient documentation

## 2016-08-15 DIAGNOSIS — S29019A Strain of muscle and tendon of unspecified wall of thorax, initial encounter: Secondary | ICD-10-CM

## 2016-08-15 DIAGNOSIS — I10 Essential (primary) hypertension: Secondary | ICD-10-CM | POA: Insufficient documentation

## 2016-08-15 DIAGNOSIS — Y999 Unspecified external cause status: Secondary | ICD-10-CM | POA: Insufficient documentation

## 2016-08-15 MED ORDER — ONDANSETRON 8 MG PO TBDP
8.0000 mg | ORAL_TABLET | Freq: Once | ORAL | Status: AC
  Administered 2016-08-15: 8 mg via ORAL
  Filled 2016-08-15: qty 1

## 2016-08-15 MED ORDER — DIAZEPAM 5 MG PO TABS: 5.0000 mg | ORAL_TABLET | Freq: Three times a day (TID) | ORAL | 0 refills | Status: DC | PRN

## 2016-08-15 MED ORDER — HYDROMORPHONE HCL 1 MG/ML IJ SOLN
1.0000 mg | Freq: Once | INTRAMUSCULAR | Status: AC
  Administered 2016-08-15: 1 mg via INTRAMUSCULAR
  Filled 2016-08-15: qty 1

## 2016-08-15 MED ORDER — OXYCODONE-ACETAMINOPHEN 5-325 MG PO TABS: 1.0000 | ORAL_TABLET | ORAL | 0 refills | Status: DC | PRN

## 2016-08-15 NOTE — ED Triage Notes (Signed)
Slipped on hard wood floor last night, c/o R upper back pain. (denies: LOC, neck pain, hitting head, vomiting, recent illness), rates pain 10/10, no meds PTA.

## 2016-08-15 NOTE — ED Provider Notes (Signed)
[facility] Provider Note   CSN: [id number] Arrival date & time: 08/15/16  0244     History   Chief Complaint Chief Complaint  Patient presents with  . Fall    HPI Diane Sexton is a 46 y.o. female.  Patient presents to the ER for evaluation of back pain. Patient reports that she was walking and slipped on a wood floor, fell to her knees but jarred her back. She did not fall all the way to the ground. No head or neck injury. Patient reports pain on the left side of her back from the mid back down to the lower back. No radiation to the leg. No numbness, tingling, weakness of the lower extremities. Pain is constant but significantly worsens with any movement.      Past Medical History:  Diagnosis Date  . Constipation   . Depression    bi-polar  . DUB (dysfunctional uterine bleeding)   . Fibroids   . Hyperlipemia   . Hypertension   . PMDD (premenstrual dysphoric disorder)     Patient Active Problem List   Diagnosis Date Noted  . Bacterial vaginitis 08/25/2012  . Essential hypertension, benign 05/11/2012  . Overweight (BMI 25.0-29.9) 05/11/2012  . Seborrheic keratosis 05/11/2012  . Metatarsalgia of left foot 12/15/2011  . LGSIL (low grade squamous intraepithelial dysplasia) 07/26/2011  . Bipolar II disorder ([place]) 05/11/2011    Class: Chronic  . Obesity 04/05/2011  . Chest pain, atypical 11/18/2010  . Fibroids 09/18/2010  . Elevated TSH 05/22/2010  . PMDD 12/24/2009  . HYPERLIPIDEMIA 06/09/2009  . DEGENERATIVE JOINT DISEASE, SHOULDER 04/28/2009  . BACK PAIN 04/28/2009    Past Surgical History:  Procedure Laterality Date  . APPENDECTOMY  2009  . [place]  2008    OB History    Gravida Para Term Preterm AB Living   3       3 0   SAB TAB Ectopic Multiple Live Births     3             Home Medications    Prior to Admission medications   Medication Sig Start Date End Date Taking? Authorizing Provider  cyclobenzaprine (FLEXERIL) 10 MG  tablet Take 1 tablet (10 mg total) by mouth 2 (two) times daily as needed for muscle spasms. 11/10/11   Fransico Meadow, PA-C  diazepam (VALIUM) 5 MG tablet Take 1 tablet (5 mg total) by mouth every 8 (eight) hours as needed for muscle spasms. 08/15/16   Orpah Greek, MD  hydrochlorothiazide (HYDRODIURIL) 12.5 MG tablet TAKE 1 TABLET BY MOUTH EVERY DAY 06/03/15   Frazier Richards, MD  metroNIDAZOLE (FLAGYL) 500 MG tablet Take 1 tablet (500 mg total) by mouth 2 (two) times daily. 05/02/13   Melony Overly, MD  oxyCODONE-acetaminophen (PERCOCET) 5-325 MG tablet Take 1 tablet by mouth every 4 (four) hours as needed. 08/15/16   Orpah Greek, MD    Family History Family History  Problem Relation Age of Onset  . Hypertension Mother   . Heart attack Mother 25       light MI  . Hypertension Father   . Diabetes Father   . Diabetes Mellitus II Father   . Diabetes Mellitus II Paternal Aunt   . Diabetes Mellitus II Paternal Aunt   . Diabetes Mellitus II Paternal Uncle   . Dementia Maternal Grandmother   . Diabetes Mellitus II Paternal Uncle     Social History Social History  Substance Use Topics  .  Smoking status: Never Smoker  . Smokeless tobacco: Never Used  . Alcohol use 0.5 oz/week    1 Standard drinks or equivalent per week     Comment: occasionally     Allergies   Oxycodone; Cyclobenzaprine; and Hydrocodone-apap-dietary prod   Review of Systems Review of Systems  Musculoskeletal: Positive for back pain.  All other systems reviewed and are negative.    Physical Exam Updated Vital Signs BP 140/83 (BP Location: Right Arm)   Pulse 64   Temp 98.4 F (36.9 C) (Oral)   Resp 18   Ht 5\' 5"  (1.651 m)   Wt 77.1 kg (170 lb)   LMP 08/14/2016 (Exact Date)   SpO2 97%   BMI 28.29 kg/m   Physical Exam  Constitutional: She is oriented to person, place, and time. She appears well-developed and well-nourished. No distress.  HENT:  Head: Normocephalic and atraumatic.    Right Ear: Hearing normal.  Left Ear: Hearing normal.  Nose: Nose normal.  Mouth/Throat: Oropharynx is clear and moist and mucous membranes are normal.  Eyes: Pupils are equal, round, and reactive to light. Conjunctivae and EOM are normal.  Neck: Normal range of motion. Neck supple.  Cardiovascular: Regular rhythm, S1 normal and S2 normal.  Exam reveals no gallop and no friction rub.   No murmur heard. Pulmonary/Chest: Effort normal and breath sounds normal. No respiratory distress. She exhibits no tenderness.  Abdominal: Soft. Normal appearance and bowel sounds are normal. There is no hepatosplenomegaly. There is no tenderness. There is no rebound, no guarding, no tenderness at McBurney's point and negative Murphy's sign. No hernia.  Musculoskeletal: Normal range of motion.       Lumbar back: She exhibits tenderness.       Back:  Neurological: She is alert and oriented to person, place, and time. She has normal strength. No cranial nerve deficit or sensory deficit. Coordination normal. GCS eye subscore is 4. GCS verbal subscore is 5. GCS motor subscore is 6.  Skin: Skin is warm, dry and intact. No rash noted. No cyanosis.  Psychiatric: She has a normal mood and affect. Her speech is normal and behavior is normal. Thought content normal.  Nursing note and vitals reviewed.    ED Treatments / Results  Labs (all labs ordered are listed, but only abnormal results are displayed) Labs Reviewed - No data to display  EKG  EKG Interpretation None       Radiology Dg Thoracic Spine 2 View  Result Date: 08/15/2016 CLINICAL DATA:  Fall with back pain EXAM: THORACIC SPINE 2 VIEWS COMPARISON:  None. FINDINGS: There is no evidence of thoracic spine fracture. Alignment is normal. No other significant bone abnormalities are identified. IMPRESSION: Negative. Electronically Signed   By: Donavan Foil M.D.   On: 08/15/2016 04:03   Dg Lumbar Spine Complete  Result Date: 08/15/2016 CLINICAL DATA:   Fall with low back pain EXAM: LUMBAR SPINE - COMPLETE 4+ VIEW COMPARISON:  Ultrasound 07/29/2011 FINDINGS: Large calcified fibroids in the pelvis. Five non rib-bearing lumbar type vertebra. Normal alignment. Vertebral body heights are maintained. IMPRESSION: 1. No acute osseous abnormality 2. Large calcified uterine fibroids Electronically Signed   By: Donavan Foil M.D.   On: 08/15/2016 04:03    Procedures Procedures (including critical care time)  Medications Ordered in ED Medications  HYDROmorphone (DILAUDID) injection 1 mg (1 mg Intramuscular Given 08/15/16 0407)  ondansetron (ZOFRAN-ODT) disintegrating tablet 8 mg (8 mg Oral Given 08/15/16 0407)     Initial Impression /  Assessment and Plan / ED Course  I have reviewed the triage vital signs and the nursing notes.  Pertinent labs & imaging results that were available during my care of the patient were reviewed by me and considered in my medical decision making (see chart for details).     Patient presents to the emergency department for evaluation of back pain after a fall. Patient reports slipping on a wood floor, falling to her knees. She felt immediate pain in her back and this has worsened over the last couple of hours. She is having significant spasms of the paraspinal muscles on the left side in the thoracic and lumbar region. X-ray of thoracic spine and lumbar spine negative for fracture. She has normal neurologic function. She is feeling much improvement after analgesia. Will discharge with continued analgesia, Valium as needed for spasm. She reports that she does not have an actual allergy to oxycodone or hydrocodone, has had some nausea. Will provide Zofran.  Final Clinical Impressions(s) / ED Diagnoses   Final diagnoses:  Lumbar strain, initial encounter  Thoracic myofascial strain, initial encounter    New Prescriptions New Prescriptions   DIAZEPAM (VALIUM) 5 MG TABLET    Take 1 tablet (5 mg total) by mouth every 8  (eight) hours as needed for muscle spasms.   OXYCODONE-ACETAMINOPHEN (PERCOCET) 5-325 MG TABLET    Take 1 tablet by mouth every 4 (four) hours as needed.     Orpah Greek, MD 08/15/16 [phone]

## 2016-08-15 NOTE — ED Notes (Addendum)
Pt not in room, pt in xray.  

## 2016-08-15 NOTE — ED Notes (Signed)
Alert, NAD, calm, interactive, resps e/u, speaking in clear complete sentences, no dyspnea noted, skin W&D, VSS, c/o R upper back pain, also some nausea.

## 2016-10-18 ENCOUNTER — Other Ambulatory Visit: Payer: Self-pay | Admitting: Obstetrics and Gynecology

## 2016-10-18 DIAGNOSIS — Z1231 Encounter for screening mammogram for malignant neoplasm of breast: Secondary | ICD-10-CM

## 2016-11-04 ENCOUNTER — Ambulatory Visit (HOSPITAL_COMMUNITY)
Admission: RE | Admit: 2016-11-04 | Discharge: 2016-11-04 | Disposition: A | Discharge status: Home / Self Care | Payer: Self-pay | Source: Ambulatory Visit | Attending: Obstetrics and Gynecology | Admitting: Obstetrics and Gynecology

## 2016-11-04 ENCOUNTER — Ambulatory Visit
Admission: RE | Admit: 2016-11-04 | Discharge: 2016-11-04 | Disposition: A | Discharge status: Home / Self Care | Payer: Self-pay | Source: Ambulatory Visit | Attending: Obstetrics and Gynecology | Admitting: Obstetrics and Gynecology

## 2016-11-04 ENCOUNTER — Encounter (HOSPITAL_COMMUNITY): Payer: Self-pay

## 2016-11-04 VITALS — BP 118/88 | Temp 98.6°F | Ht 65.5 in | Wt 179.0 lb

## 2016-11-04 DIAGNOSIS — Z1231 Encounter for screening mammogram for malignant neoplasm of breast: Secondary | ICD-10-CM

## 2016-11-04 DIAGNOSIS — Z1239 Encounter for other screening for malignant neoplasm of breast: Secondary | ICD-10-CM

## 2016-11-04 NOTE — Patient Instructions (Signed)
Explained breast self awareness with [name]. Patient is currently on menstrual period, therefore unable to complete Pap smear. Scheduled patient for Pap smear in [facility] on Tuesday, November 23, 2016 at 0900. Referred patient to the [facility] for a screening mammogram. Appointment scheduled for Thursday, November 04, 2016 at 1110. Patient aware of appointments and will be there. Let patient know the [facility] will follow up with her within the next couple weeks with results of mammogram by letter or phone. Diane Sexton verbalized understanding.  Diane Sexton, Arvil Chaco, RN 2:12 PM

## 2016-11-04 NOTE — Progress Notes (Signed)
No complaints today.   Pap Smear: Pap smear not completed today. Last Pap smear was in February 2017 and LGSIL and negative HPV per patient. Patient is currently on menstrual period, therefore unable to complete Pap smear. Scheduled patient for Pap smear in [facility] on Tuesday, November 23, 2016 at 0900. Patient has a history of at least five abnormal Pap smears per patient. Patients Pap smear 11/22/2012 was LGSIL and negative HPV. Patient has a history of one colposcopy in December 2010 for follow-up. Last Pap smear result is not in EPIC. Previous Pap smear result from 11/22/2012 is in EPIC.   Physical exam: Breasts Breasts symmetrical. No skin abnormalities bilateral breasts. No nipple retraction bilateral breasts. No nipple discharge bilateral breasts. No lymphadenopathy. No lumps palpated bilateral breasts. No complaints of pain or tenderness on exam. Referred patient to the [facility] for a screening mammogram. Appointment scheduled for Thursday, November 04, 2016 at 1110.        Pelvic/Bimanual No Pap smear completed today since patient is currently on menstrual cycle.  Smoking History: Patient has never smoked.  Patient Navigation: Patient education provided. Access to services provided for patient through [facility] program.

## 2016-11-05 ENCOUNTER — Encounter (HOSPITAL_COMMUNITY): Payer: Self-pay | Admitting: *Deleted

## 2016-11-23 ENCOUNTER — Ambulatory Visit (HOSPITAL_COMMUNITY)
Admission: RE | Admit: 2016-11-23 | Discharge: 2016-11-23 | Disposition: A | Discharge status: Home / Self Care | Payer: No Typology Code available for payment source | Source: Ambulatory Visit | Attending: Obstetrics and Gynecology | Admitting: Obstetrics and Gynecology

## 2016-11-23 ENCOUNTER — Encounter (HOSPITAL_COMMUNITY): Payer: Self-pay

## 2016-11-23 VITALS — Wt 180.0 lb

## 2016-11-23 DIAGNOSIS — Z01419 Encounter for gynecological examination (general) (routine) without abnormal findings: Secondary | ICD-10-CM

## 2016-11-23 NOTE — Patient Instructions (Signed)
Explained breast self awareness with Diane Sexton. Let patient know that if today's Pap smear is normal that her next Pap smear is due in one year due to her recent history of an abnormal Pap smear. Reminded patient that her next mammogram is due in one year. Let patient know will follow up with her within the next couple weeks with results of Pap smear and wet prep by phone. Diane Sexton verbalized understanding.  [name], Arvil Chaco, RN 10:49 AM

## 2016-11-23 NOTE — Progress Notes (Signed)
No complaints today.   Pap Smear: Pap smear completed today. Last Pap smear was in February 2017 and LGSIL and negative HPV per patient. Patient has a history of at least five abnormal Pap smears per patient. Patients Pap smear 11/22/2012 was LGSIL and negative HPV. Patient has a history of one colposcopy in December 2010 for follow-up. Last Pap smear result is not in EPIC. Previous Pap smear result from 11/22/2012 is in EPIC.  Pelvic/Bimanual   Ext Genitalia No lesions, no swelling and no discharge observed on external genitalia.         Vagina Vagina pink and normal texture. No lesions and whitish/yellowish colored frothy discharge observed in vagina. Wet prep completed.         Cervix Cervix is present. Cervix pink and of normal texture. Whitish/yellowish colored frothy discharge observed on cervical os.    Uterus Uterus is present and palpable. Uterus in normal position and normal size.        Adnexae Bilateral ovaries present and palpable. No tenderness on palpation.          Rectovaginal No rectal exam completed today since patient had no rectal complaints. No skin abnormalities observed on exam.    Smoking History: Patient has never smoked.  Patient Navigation: Patient education provided. Access to services provided for patient through [facility] program.

## 2016-11-26 LAB — CYTOLOGY - PAP
Adequacy: ABSENT
BACTERIAL VAGINITIS: NEGATIVE
CANDIDA VAGINITIS: NEGATIVE
Diagnosis: NEGATIVE
HPV: NOT DETECTED
TRICH (WINDOWPATH): POSITIVE — AB

## 2016-11-27 ENCOUNTER — Other Ambulatory Visit: Payer: Self-pay | Admitting: Obstetrics and Gynecology

## 2016-11-27 DIAGNOSIS — N898 Other specified noninflammatory disorders of vagina: Secondary | ICD-10-CM

## 2016-11-27 DIAGNOSIS — B9689 Other specified bacterial agents as the cause of diseases classified elsewhere: Secondary | ICD-10-CM

## 2016-11-27 DIAGNOSIS — N760 Acute vaginitis: Secondary | ICD-10-CM

## 2016-11-27 MED ORDER — METRONIDAZOLE 500 MG PO TABS: 500.0000 mg | ORAL_TABLET | Freq: Two times a day (BID) | ORAL | 2 refills | Status: DC

## 2016-11-29 ENCOUNTER — Telehealth (HOSPITAL_COMMUNITY): Payer: Self-pay | Admitting: *Deleted

## 2016-11-29 ENCOUNTER — Other Ambulatory Visit (HOSPITAL_COMMUNITY): Payer: Self-pay | Admitting: *Deleted

## 2016-11-29 DIAGNOSIS — B379 Candidiasis, unspecified: Secondary | ICD-10-CM

## 2016-11-29 DIAGNOSIS — N760 Acute vaginitis: Principal | ICD-10-CM

## 2016-11-29 DIAGNOSIS — B9689 Other specified bacterial agents as the cause of diseases classified elsewhere: Secondary | ICD-10-CM

## 2016-11-29 MED ORDER — FLUCONAZOLE 150 MG PO TABS: 150.0000 mg | ORAL_TABLET | Freq: Once | ORAL | 0 refills | Status: AC

## 2016-11-29 MED ORDER — METRONIDAZOLE 500 MG PO TABS: 500.0000 mg | ORAL_TABLET | Freq: Two times a day (BID) | ORAL | 0 refills | Status: DC

## 2016-11-29 NOTE — Telephone Encounter (Signed)
Patient returned call to [facility]. Advised patient of negative pap smear results. HPV was negative next pap smear due in one year. Advised patient of trichomoniasis on pap smear and medication was called into pharmacy. Advised patient to finish all medication, no alcohol, and sexual partner also needs to be treated. Patient also stated was having some vaginal itching along with discharge. Advised patient would call in [place]. Patient voiced understanding.

## 2016-11-29 NOTE — Telephone Encounter (Signed)
-----   Message from Mora Bellman, MD sent at 11/27/2016 11:21 AM EDT ----- Patient with Trich on pap. Rx Flagyl has been e-prescribed. Please inform patient of infection and need for treatment. Her partner also needs to be informed and treated  Thanks  Peggy ----- Message ----- From: Interface, Lab In [facility] Sent: 11/26/2016   2:23 PM To: Mora Bellman, MD

## 2017-01-03 ENCOUNTER — Ambulatory Visit: Payer: No Typology Code available for payment source | Admitting: Obstetrics & Gynecology

## 2017-01-07 ENCOUNTER — Ambulatory Visit: Payer: No Typology Code available for payment source | Admitting: Obstetrics & Gynecology

## 2017-01-17 ENCOUNTER — Ambulatory Visit: Payer: No Typology Code available for payment source | Admitting: Obstetrics & Gynecology

## 2017-02-23 ENCOUNTER — Emergency Department (HOSPITAL_BASED_OUTPATIENT_CLINIC_OR_DEPARTMENT_OTHER)
Admission: EM | Admit: 2017-02-23 | Discharge: 2017-02-23 | Disposition: A | Discharge status: Home / Self Care | Payer: No Typology Code available for payment source | Attending: Physician Assistant | Admitting: Physician Assistant

## 2017-02-23 ENCOUNTER — Encounter (HOSPITAL_BASED_OUTPATIENT_CLINIC_OR_DEPARTMENT_OTHER): Payer: Self-pay | Admitting: *Deleted

## 2017-02-23 ENCOUNTER — Other Ambulatory Visit: Payer: Self-pay

## 2017-02-23 DIAGNOSIS — R05 Cough: Secondary | ICD-10-CM | POA: Insufficient documentation

## 2017-02-23 DIAGNOSIS — R058 Other specified cough: Secondary | ICD-10-CM

## 2017-02-23 DIAGNOSIS — I10 Essential (primary) hypertension: Secondary | ICD-10-CM | POA: Insufficient documentation

## 2017-02-23 MED ORDER — GUAIFENESIN-CODEINE 100-10 MG/5ML PO SOLN: 5.0000 mL | Freq: Four times a day (QID) | ORAL | 0 refills | Status: DC | PRN

## 2017-02-23 MED ORDER — BENZONATATE 100 MG PO CAPS: 100.0000 mg | ORAL_CAPSULE | Freq: Three times a day (TID) | ORAL | 0 refills | Status: DC | PRN

## 2017-02-23 NOTE — ED Notes (Signed)
Pt c/o lingering dry cough for three weeks with throat "burning."  Has not tried any medications for symptoms today.

## 2017-02-23 NOTE — ED Notes (Signed)
ED Provider at bedside. 

## 2017-02-23 NOTE — ED Notes (Signed)
Updated pt to wait time again

## 2017-02-23 NOTE — ED Provider Notes (Signed)
[facility] EMERGENCY DEPARTMENT Provider Note   CSN: [id number] Arrival date & time: 02/23/17  1427     History   Chief Complaint Chief Complaint  Patient presents with  . URI    HPI Diane Sexton is a 47 y.o. female.  HPI   Virus 2 weeks ago.  Still has cough.  Nonproductive.  No fevers.  Eating drinking normally.  Past Medical History:  Diagnosis Date  . Constipation   . Depression    bi-polar  . DUB (dysfunctional uterine bleeding)   . Fibroids   . Hyperlipemia   . Hypertension   . PMDD (premenstrual dysphoric disorder)     Patient Active Problem List   Diagnosis Date Noted  . Bacterial vaginitis 08/25/2012  . Essential hypertension, benign 05/11/2012  . Overweight (BMI 25.0-29.9) 05/11/2012  . Seborrheic keratosis 05/11/2012  . Metatarsalgia of left foot 12/15/2011  . LGSIL (low grade squamous intraepithelial dysplasia) 07/26/2011  . Bipolar II disorder ([place]) 05/11/2011    Class: Chronic  . Obesity 04/05/2011  . Chest pain, atypical 11/18/2010  . Fibroids 09/18/2010  . Elevated TSH 05/22/2010  . PMDD 12/24/2009  . HYPERLIPIDEMIA 06/09/2009  . DEGENERATIVE JOINT DISEASE, SHOULDER 04/28/2009  . BACK PAIN 04/28/2009    Past Surgical History:  Procedure Laterality Date  . APPENDECTOMY  2009  . [place]  2008    OB History    Gravida Para Term Preterm AB Living   3       3 0   SAB TAB Ectopic Multiple Live Births     3             Home Medications    Prior to Admission medications   Medication Sig Start Date End Date Taking? Authorizing Provider  benzonatate (TESSALON PERLES) 100 MG capsule Take 1 capsule (100 mg total) by mouth 3 (three) times daily as needed for cough. 02/23/17   Key Cen Lyn, MD  guaiFENesin-codeine 100-10 MG/5ML syrup Take 5 mLs by mouth every 6 (six) hours as needed for cough. 02/23/17   Cynithia Hakimi Lyn, MD  hydrochlorothiazide (HYDRODIURIL) 12.5 MG tablet TAKE 1 TABLET BY MOUTH EVERY  DAY Patient not taking: Reported on 11/04/2016 06/03/15   Frazier Richards, MD  oxyCODONE-acetaminophen (PERCOCET) 5-325 MG tablet Take 1 tablet by mouth every 4 (four) hours as needed. Patient not taking: Reported on 11/04/2016 08/15/16   Orpah Greek, MD    Family History Family History  Problem Relation Age of Onset  . Hypertension Mother   . Hypertension Father   . Diabetes Father   . Diabetes Mellitus II Father   . Diabetes Mellitus II Paternal Aunt   . Diabetes Mellitus II Paternal Aunt   . Diabetes Mellitus II Paternal Uncle   . Dementia Maternal Grandmother   . Heart attack Maternal Grandmother 48  . Diabetes Mellitus II Paternal Uncle     Social History Social History   Tobacco Use  . Smoking status: Never Smoker  . Smokeless tobacco: Never Used  Substance Use Topics  . Alcohol use: Yes    Alcohol/week: 0.5 oz    Types: 1 Standard drinks or equivalent per week    Comment: occasionally  . Drug use: No     Allergies   Oxycodone; Cyclobenzaprine; and Hydrocodone-apap-dietary prod   Review of Systems Review of Systems  Respiratory: Positive for cough.      Physical Exam Updated Vital Signs BP (!) 150/97 (BP Location: Left Arm)   Pulse  97   Temp 98.7 F (37.1 C) (Oral)   Resp 18   Ht 5\' 5"  (1.651 m)   Wt 85.7 kg (189 lb)   LMP 02/05/2017   SpO2 98%   BMI 31.45 kg/m   Physical Exam  Constitutional: She is oriented to person, place, and time. She appears well-developed and well-nourished.  HENT:  Head: Normocephalic and atraumatic.  Eyes: Right eye exhibits no discharge. Left eye exhibits no discharge.  Cardiovascular: Normal rate, regular rhythm and normal heart sounds.  No murmur heard. Pulmonary/Chest: Effort normal and breath sounds normal. She has no wheezes. She has no rales.  Abdominal: Soft. She exhibits no distension. There is no tenderness.  Neurological: She is oriented to person, place, and time.  Skin: Skin is warm and dry. She  is not diaphoretic.  Psychiatric: She has a normal mood and affect.  Nursing note and vitals reviewed.    ED Treatments / Results  Labs (all labs ordered are listed, but only abnormal results are displayed) Labs Reviewed - No data to display  EKG  EKG Interpretation None       Radiology No results found.  Procedures Procedures (including critical care time)  Medications Ordered in ED Medications - No data to display   Initial Impression / Assessment and Plan / ED Course  I have reviewed the triage vital signs and the nursing notes.  Pertinent labs & imaging results that were available during my care of the patient were reviewed by me and considered in my medical decision making (see chart for details).     Virus 2 weeks ago.  Still has cough.  Nonproductive.  No fevers.  Eating drinking normally.  3:26 PM Discussed post viral cough syndrome.  Reported told her that she may last up to 6 weeks.  Gave her supportive care.   doubt other pathology.  Normal vital signs.  Final Clinical Impressions(s) / ED Diagnoses   Final diagnoses:  Post-viral cough syndrome    ED Discharge Orders        Ordered    benzonatate (TESSALON PERLES) 100 MG capsule  3 times daily PRN     02/23/17 1525    guaiFENesin-codeine 100-10 MG/5ML syrup  Every 6 hours PRN     02/23/17 1525       Kana Reimann Lyn, MD 02/23/17 1526

## 2017-02-23 NOTE — ED Notes (Signed)
Pt verbalizes understanding of d/c instructions and denies any further need at this time. 

## 2017-02-23 NOTE — ED Triage Notes (Signed)
Pt /o URI symptoms x 3 weeks, cough and left ear pain

## 2017-03-27 ENCOUNTER — Other Ambulatory Visit: Payer: Self-pay

## 2017-03-27 ENCOUNTER — Emergency Department (HOSPITAL_BASED_OUTPATIENT_CLINIC_OR_DEPARTMENT_OTHER)
Admission: EM | Admit: 2017-03-27 | Discharge: 2017-03-27 | Disposition: A | Discharge status: Home / Self Care | Payer: Self-pay | Attending: Emergency Medicine | Admitting: Emergency Medicine

## 2017-03-27 ENCOUNTER — Encounter (HOSPITAL_BASED_OUTPATIENT_CLINIC_OR_DEPARTMENT_OTHER): Payer: Self-pay | Admitting: Emergency Medicine

## 2017-03-27 ENCOUNTER — Emergency Department (HOSPITAL_BASED_OUTPATIENT_CLINIC_OR_DEPARTMENT_OTHER): Payer: Self-pay

## 2017-03-27 DIAGNOSIS — Z5321 Procedure and treatment not carried out due to patient leaving prior to being seen by health care provider: Secondary | ICD-10-CM | POA: Insufficient documentation

## 2017-03-27 DIAGNOSIS — R05 Cough: Secondary | ICD-10-CM | POA: Insufficient documentation

## 2017-03-27 NOTE — ED Notes (Signed)
Pt called for triage, no response. 

## 2017-03-27 NOTE — ED Triage Notes (Signed)
Cough x 6 weeks. Was prescribed tessalon and cough syrup with codeine but pt states symptoms persist.

## 2017-04-20 ENCOUNTER — Emergency Department (HOSPITAL_BASED_OUTPATIENT_CLINIC_OR_DEPARTMENT_OTHER): Payer: Self-pay

## 2017-04-20 ENCOUNTER — Encounter (HOSPITAL_BASED_OUTPATIENT_CLINIC_OR_DEPARTMENT_OTHER): Payer: Self-pay | Admitting: Emergency Medicine

## 2017-04-20 ENCOUNTER — Emergency Department (HOSPITAL_BASED_OUTPATIENT_CLINIC_OR_DEPARTMENT_OTHER)
Admission: EM | Admit: 2017-04-20 | Discharge: 2017-04-20 | Disposition: A | Discharge status: Home / Self Care | Payer: Self-pay | Attending: Emergency Medicine | Admitting: Emergency Medicine

## 2017-04-20 ENCOUNTER — Other Ambulatory Visit: Payer: Self-pay

## 2017-04-20 DIAGNOSIS — G8929 Other chronic pain: Secondary | ICD-10-CM

## 2017-04-20 DIAGNOSIS — I10 Essential (primary) hypertension: Secondary | ICD-10-CM | POA: Insufficient documentation

## 2017-04-20 DIAGNOSIS — Z79899 Other long term (current) drug therapy: Secondary | ICD-10-CM | POA: Insufficient documentation

## 2017-04-20 DIAGNOSIS — M545 Low back pain: Secondary | ICD-10-CM | POA: Insufficient documentation

## 2017-04-20 DIAGNOSIS — M25511 Pain in right shoulder: Secondary | ICD-10-CM

## 2017-04-20 MED ORDER — NAPROXEN 250 MG PO TABS
375.0000 mg | ORAL_TABLET | Freq: Once | ORAL | Status: AC
  Administered 2017-04-20: 375 mg via ORAL
  Filled 2017-04-20: qty 2

## 2017-04-20 MED ORDER — METHOCARBAMOL 500 MG PO TABS: 500.0000 mg | ORAL_TABLET | Freq: Two times a day (BID) | ORAL | 0 refills | Status: DC

## 2017-04-20 NOTE — ED Provider Notes (Signed)
[facility] EMERGENCY DEPARTMENT Provider Note   CSN: [id number] Arrival date & time: 04/20/17  1731     History   Chief Complaint No chief complaint on file.   HPI Diane Sexton is a 47 y.o. female past medical history of hyperlipidemia, hypertension, chronic low back pain who presents for evaluation of right shoulder pain that is been ongoing for the past week, and worsening lower back pain.  Patient reports that she does chronic overhead repetitive motion at work.  She denies any preceding trauma, injury, fall.  She states that shoulder pain is worse with movement of the right upper extremity. Patient states she has had some intermittent tingling to the right hand. Denies any weakness.  She is not taking any medications for the pain.  Additionally, patient reports that she has been having lower back pain.  Patient denies any preceding trauma, injury.  Patient has been able to ambulate without any difficulty. Denies fevers, weight loss, numbness/weakness of upper and lower extremities, bowel/bladder incontinence, saddle anesthesia, history of back surgery, history of IVDA.  Patient denies any speech difficulty, facial numbness, chest pain, difficulty breathing.  The history is provided by the patient.    Past Medical History:  Diagnosis Date  . Constipation   . Depression    bi-polar  . DUB (dysfunctional uterine bleeding)   . Fibroids   . Hyperlipemia   . Hypertension   . PMDD (premenstrual dysphoric disorder)     Patient Active Problem List   Diagnosis Date Noted  . Bacterial vaginitis 08/25/2012  . Essential hypertension, benign 05/11/2012  . Overweight (BMI 25.0-29.9) 05/11/2012  . Seborrheic keratosis 05/11/2012  . Metatarsalgia of left foot 12/15/2011  . LGSIL (low grade squamous intraepithelial dysplasia) 07/26/2011  . Bipolar II disorder ([place]) 05/11/2011    Class: Chronic  . Obesity 04/05/2011  . Chest pain, atypical 11/18/2010  . Fibroids  09/18/2010  . Elevated TSH 05/22/2010  . PMDD 12/24/2009  . HYPERLIPIDEMIA 06/09/2009  . DEGENERATIVE JOINT DISEASE, SHOULDER 04/28/2009  . BACK PAIN 04/28/2009    Past Surgical History:  Procedure Laterality Date  . APPENDECTOMY  2009  . [place]  2008    OB History    Gravida Para Term Preterm AB Living   3       3 0   SAB TAB Ectopic Multiple Live Births     3             Home Medications    Prior to Admission medications   Medication Sig Start Date End Date Taking? Authorizing Provider  benzonatate (TESSALON PERLES) 100 MG capsule Take 1 capsule (100 mg total) by mouth 3 (three) times daily as needed for cough. 02/23/17   Mackuen, Courteney Lyn, MD  guaiFENesin-codeine 100-10 MG/5ML syrup Take 5 mLs by mouth every 6 (six) hours as needed for cough. 02/23/17   Mackuen, Courteney Lyn, MD  hydrochlorothiazide (HYDRODIURIL) 12.5 MG tablet TAKE 1 TABLET BY MOUTH EVERY DAY Patient not taking: Reported on 11/04/2016 06/03/15   Frazier Richards, MD  methocarbamol (ROBAXIN) 500 MG tablet Take 1 tablet (500 mg total) by mouth 2 (two) times daily. 04/20/17   Volanda Napoleon, PA-C  oxyCODONE-acetaminophen (PERCOCET) 5-325 MG tablet Take 1 tablet by mouth every 4 (four) hours as needed. Patient not taking: Reported on 11/04/2016 08/15/16   Orpah Greek, MD    Family History Family History  Problem Relation Age of Onset  . Hypertension Mother   . Hypertension Father   .  Diabetes Father   . Diabetes Mellitus II Father   . Diabetes Mellitus II Paternal Aunt   . Diabetes Mellitus II Paternal Aunt   . Diabetes Mellitus II Paternal Uncle   . Dementia Maternal Grandmother   . Heart attack Maternal Grandmother 48  . Diabetes Mellitus II Paternal Uncle     Social History Social History   Tobacco Use  . Smoking status: Never Smoker  . Smokeless tobacco: Never Used  Substance Use Topics  . Alcohol use: Yes    Alcohol/week: 0.5 oz    Types: 1 Standard drinks or equivalent per week     Comment: occasionally  . Drug use: No     Allergies   Oxycodone; Cyclobenzaprine; and Hydrocodone-apap-dietary prod   Review of Systems Review of Systems  Constitutional: Negative for fever.  Respiratory: Negative for shortness of breath.   Cardiovascular: Negative for chest pain.  Gastrointestinal: Negative for abdominal pain, nausea and vomiting.  Genitourinary: Negative for dysuria and hematuria.  Musculoskeletal: Positive for back pain. Negative for gait problem.       Shoulder pain  Neurological: Positive for numbness. Negative for facial asymmetry, speech difficulty, weakness and headaches.     Physical Exam Updated Vital Signs BP (!) 145/85 (BP Location: Right Arm)   Pulse 89   Temp 98.3 F (36.8 C)   Resp 18   Ht 5\' 5"  (1.651 m)   Wt 81.6 kg (180 lb)   LMP 04/17/2017   SpO2 100%   BMI 29.95 kg/m   Physical Exam  Constitutional: She appears well-developed and well-nourished.  HENT:  Head: Normocephalic and atraumatic.  Eyes: Conjunctivae and EOM are normal. Right eye exhibits no discharge. Left eye exhibits no discharge. No scleral icterus.  Neck: Full passive range of motion without pain.  Full flexion/extension and lateral movement of neck fully intact. No bony midline tenderness. No deformities or crepitus.  Positive Spurling's.  Cardiovascular:  Pulses:      Radial pulses are 2+ on the right side, and 2+ on the left side.  Pulmonary/Chest: Effort normal.  Musculoskeletal:  Diffuse tenderness noted to the anterior aspect of the right shoulder.  No overlying deformity, crepitus.  No overlying warmth, erythema, soft tissue swelling.  Positive Neer's impingement, Hawkins.  Negative liftoff test.  Some pain with subscapularis test.  No tenderness to right elbow, right wrist.  Full range of motion of right elbow and wrist without any difficulty.  Limited active range of motion of right shoulder secondary to pain.  No abnormalities of the left upper extremity.    Neurological: She is alert.  Follows commands, Moves all extremities  5/5 strength to BUE and BLE  Sensation intact throughout all major nerve distributions  Skin: Skin is warm and dry.  Psychiatric: She has a normal mood and affect. Her speech is normal and behavior is normal.  Nursing note and vitals reviewed.    ED Treatments / Results  Labs (all labs ordered are listed, but only abnormal results are displayed) Labs Reviewed - No data to display  EKG  EKG Interpretation None       Radiology Dg Lumbar Spine Complete  Result Date: 04/20/2017 CLINICAL DATA:  Chronic lumbar pain for 5 years radiating to LEFT hip at times EXAM: [place] 4+ VIEW COMPARISON:  08/15/2016 FINDINGS: Five non-rib-bearing lumbar vertebra. Bones appear mildly demineralized. Vertebral body heights maintained. Minimal disc space narrowing L4-L5. No acute fracture, subluxation, or bone destruction. No spondylolysis. SI joints preserved. Large  calcifications in the pelvis likely representing calcified uterine leiomyomata, largest LEFT of midline 7.7 x 5.1 cm. IMPRESSION: No acute lumbar spine abnormalities. Large calcifications in pelvis most consistent with calcified uterine leiomyomata, unchanged. Electronically Signed   By: Lavonia Dana M.D.   On: 04/20/2017 20:07   Dg Shoulder Right  Result Date: 04/20/2017 CLINICAL DATA:  Right shoulder pain for 1 week.  No known injury. EXAM: RIGHT SHOULDER - 2+ VIEW COMPARISON:  None. FINDINGS: There is no evidence of fracture or dislocation. There is no evidence of arthropathy or other focal bone abnormality. Soft tissues are unremarkable. IMPRESSION: Negative exam. Electronically Signed   By: Inge Rise M.D.   On: 04/20/2017 20:04    Procedures Procedures (including critical care time)  Medications Ordered in ED Medications  naproxen (NAPROSYN) tablet 375 mg (375 mg Oral Given 04/20/17 1812)     Initial Impression / Assessment and Plan / ED  Course  I have reviewed the triage vital signs and the nursing notes.  Pertinent labs & imaging results that were available during my care of the patient were reviewed by me and considered in my medical decision making (see chart for details).     47 year old female who presents for evaluation of right shoulder pain times 1 week and worsening of chronic back pain.  No preceding trauma, injury.  No fevers, chest pain, difficulty breathing.  Has had some intermittent tingling to the right hand.  No red flag symptoms. Patient is afebrile, non-toxic appearing, sitting comfortably on examination table. Vital signs reviewed and stable. No neuro deficits noted on exam.  On exam, patient is positive Neer's impingement, Hawkins.  Able to complete liftoff but with some pain.  Some pain noted with the empty can test.  Patient with diffuse muscular tenderness overlying the lumbar region.  Consider muscle strain versus a strain versus rotator cuff pathology.  History/physical exam is not concerning for CVA, ACS, spinal abscess, cauda equina.  We will plan to obtain basic x-ray for evaluation.  Analgesics provided in the ED.  X-rays reviewed.  Lumbar x-ray is without any acute abnormalities.  No evidence of acute fracture dislocation.  Right shoulder x-ray negative for any acute abnormality.    Discussed results with patient.  Suspect that symptoms are likely result of muscle strain versus radiculopathy pain versus rotator etiology.  We will plan to provide supportive care measures at home.  Encourage supportive care measures at home.  Encourage primary care follow-up in the next 2-4 days for further evaluation. Patient had ample opportunity for questions and discussion. All patient's questions were answered with full understanding. Strict return precautions discussed. Patient expresses understanding and agreement to plan.   Final Clinical Impressions(s) / ED Diagnoses   Final diagnoses:  Acute pain of right  shoulder  Chronic bilateral low back pain, with sciatica presence unspecified    ED Discharge Orders        Ordered    methocarbamol (ROBAXIN) 500 MG tablet  2 times daily     04/20/17 2034       Volanda Napoleon, PA-C 04/21/17 [id number]    Sherwood Gambler, MD 04/21/17 [phone]

## 2017-04-20 NOTE — ED Notes (Signed)
Pt refuses to give urine for pregnancy test. Pt states she will sign waiver for radiology. Radiology contacted and made aware.

## 2017-04-20 NOTE — ED Triage Notes (Signed)
Patient states that she is having pain to her right shoulder that started at work today. THE patient reports that she is having lower back pain. The patient has chronic back pain

## 2017-04-20 NOTE — ED Notes (Signed)
Patient transported to X-ray 

## 2017-04-20 NOTE — Discharge Instructions (Signed)
You can take Tylenol or Ibuprofen as directed for pain. You can alternate Tylenol and Ibuprofen every 4 hours. If you take Tylenol at 1pm, then you can take Ibuprofen at 5pm. Then you can take Tylenol again at 9pm.   Take Robaxin as prescribed. This medication will make you drowsy so do not drive or drink alcohol when taking it.  Follow the RICE (Rest, Ice, Compression, Elevation) protocol as directed.   Return to the Emergency Department for any fevers, redness or swelling of the shoulder, worsening pain, numbness/weakness of the hand, any other worsening or concerning symptoms.  Return to the Emergency Department immediately for any worsening back pain, neck pain, difficulty walking, numbness/weaknss of your arms or legs, urinary or bowel accidents, fever or any other worsening or concerning symptoms.

## 2017-06-03 ENCOUNTER — Telehealth: Payer: Self-pay | Admitting: Emergency Medicine

## 2017-06-03 NOTE — Telephone Encounter (Signed)
Returning VM from patient asking about a diflucan refill from the [facility].  States that she has attempted to call [facility] multiple times since yesterday but has not received any response.  Spoke to [employer], advised to have patient return to [facility] for refill if she feels that she needs it.  Otherwise the pt does not need a refill.  Pt verbalized understanding, states she will go to health dept and call back if she has any questions.

## 2017-09-23 ENCOUNTER — Telehealth (HOSPITAL_COMMUNITY): Payer: Self-pay

## 2017-09-23 NOTE — Telephone Encounter (Signed)
Tried to reach I left a message to call [facility]

## 2017-09-27 ENCOUNTER — Other Ambulatory Visit: Payer: Self-pay | Admitting: Obstetrics and Gynecology

## 2017-09-27 DIAGNOSIS — Z1231 Encounter for screening mammogram for malignant neoplasm of breast: Secondary | ICD-10-CM

## 2017-11-29 ENCOUNTER — Encounter (HOSPITAL_COMMUNITY): Payer: Self-pay

## 2017-11-29 ENCOUNTER — Ambulatory Visit (HOSPITAL_COMMUNITY)
Admission: RE | Admit: 2017-11-29 | Discharge: 2017-11-29 | Disposition: A | Discharge status: Home / Self Care | Payer: Self-pay | Source: Ambulatory Visit | Attending: Obstetrics and Gynecology | Admitting: Obstetrics and Gynecology

## 2017-11-29 ENCOUNTER — Ambulatory Visit
Admission: RE | Admit: 2017-11-29 | Discharge: 2017-11-29 | Disposition: A | Discharge status: Home / Self Care | Payer: No Typology Code available for payment source | Source: Ambulatory Visit | Attending: Obstetrics and Gynecology | Admitting: Obstetrics and Gynecology

## 2017-11-29 VITALS — BP 116/78 | Ht 65.5 in | Wt 182.0 lb

## 2017-11-29 DIAGNOSIS — Z01419 Encounter for gynecological examination (general) (routine) without abnormal findings: Secondary | ICD-10-CM

## 2017-11-29 DIAGNOSIS — Z1231 Encounter for screening mammogram for malignant neoplasm of breast: Secondary | ICD-10-CM

## 2017-11-29 NOTE — Patient Instructions (Signed)
Explained breast self awareness with Arleene L Sloop. Let patient know that if today's Pap smear is normal that her next Pap smear is due in one year due to her history of abnormal Pap smears. Referred patient to the [facility] for a screening mammogram. Appointment scheduled for Tuesday, November 29, 2017 at [facility]. Let patient know will follow up with her within the next couple weeks with results of Pap smear by letter or phone. Informed patient that the [facility] will follow-up with her within the next couple of weeks with results of mammogram by letter or phone. Evalyne L Vicens verbalized understanding.  [place]ann[place], Arvil Chaco, RN 10:01 AM

## 2017-11-29 NOTE — Progress Notes (Signed)
No complaints today.   Pap Smear: Pap smear completed today. Last Pap smear was 11/23/2016 at [facility] and normal with negative HPV. Patient has a history of at least six abnormal Pap smears per patient. Patients history of abnormal Pap smears are February 2017 LGSIL with negative HPV, 11/22/2012 was LGSIL with negative HPV, 01/31/2012 LSGIL with negative HPV, 07/26/2011 LGSIL with negative HPV, 03/25/2011 LGSIL, and 10/02/2010 LGSIL. 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 Six of the last seven Pap smears and colposcopy results are in Epic.  Physical exam: Breasts Breasts symmetrical. No skin abnormalities bilateral breasts. No nipple retraction bilateral breasts. No nipple discharge bilateral breasts. No lymphadenopathy. No lumps palpated bilateral breasts. No complaints of pain or tenderness on exam. Referred patient to the [facility] for a screening mammogram. Appointment scheduled for Tuesday, November 29, 2017 at [facility].        Pelvic/Bimanual   Ext Genitalia No lesions, no swelling and no discharge observed on external genitalia.         Vagina Vagina pink and normal texture. No lesions or discharge observed in vagina.          Cervix Cervix is present. Cervix pink and of normal texture. No discharge observed.     Uterus Uterus is present and palpable. Uterus in a slightly retroverted position and slightly enlarged due to history of fibroids.       Adnexae Bilateral ovaries present and palpable. No tenderness on palpation.         Rectovaginal No rectal exam completed today since patient had no rectal complaints. No skin abnormalities observed on exam.    Smoking History: Patient has never smoked.  Patient Navigation: Patient education provided. Access to services provided for patient through [facility] program.  Breast and Cervical Cancer Risk Assessment: Patient has no family history of breast cancer, known genetic  mutations, or radiation treatment to the chest before age 37. Patient has a history of cervical dysplasia. Patient has no history of being immunocompromised or DES exposure in-utero.  Risk Assessment    Risk Scores      11/29/2017   Last edited by: Armond Hang, LPN   5-year risk: 0.8 %   Lifetime risk: 6.8 %

## 2017-12-01 LAB — CYTOLOGY - PAP
DIAGNOSIS: NEGATIVE
HPV (WINDOPATH): NOT DETECTED

## 2017-12-05 ENCOUNTER — Encounter (HOSPITAL_COMMUNITY): Payer: Self-pay | Admitting: *Deleted

## 2017-12-06 ENCOUNTER — Other Ambulatory Visit (HOSPITAL_COMMUNITY): Payer: Self-pay | Admitting: *Deleted

## 2017-12-06 DIAGNOSIS — Z0000 Encounter for general adult medical examination without abnormal findings: Secondary | ICD-10-CM

## 2017-12-06 NOTE — Progress Notes (Signed)
Lipid

## 2017-12-07 ENCOUNTER — Telehealth (HOSPITAL_COMMUNITY): Payer: Self-pay | Admitting: *Deleted

## 2017-12-07 NOTE — Telephone Encounter (Signed)
Addendum to previous telephone call: Patient stated she received her Pap smear result on mychart. Explained to patient that her Pap smear was normal and HPV negative. Told patient she will need a Pap smear in one year due to her history of abnormal Pap smears. Patient verbalized understanding.

## 2017-12-07 NOTE — Telephone Encounter (Signed)
Patient called and left voicemail. Called patient back. Patient had questions about her [facility] appointment on Friday. Explained to patient her appointment at 11:00 is for [facility] and there is an appointment following that she will have labs completed. Explained to patient that she will need to be fasting, since having labs completed. Patient verbalized understanding.

## 2017-12-09 ENCOUNTER — Ambulatory Visit: Payer: No Typology Code available for payment source

## 2017-12-09 ENCOUNTER — Inpatient Hospital Stay: Payer: No Typology Code available for payment source

## 2017-12-12 ENCOUNTER — Other Ambulatory Visit (HOSPITAL_COMMUNITY): Payer: Self-pay | Admitting: *Deleted

## 2017-12-15 NOTE — Addendum Note (Signed)
Addended by: Hazeline Junker on: 12/15/2017 03:19 PM   Modules accepted: Orders

## 2017-12-16 ENCOUNTER — Inpatient Hospital Stay: Payer: No Typology Code available for payment source

## 2017-12-16 ENCOUNTER — Inpatient Hospital Stay: Payer: No Typology Code available for payment source | Admitting: *Deleted

## 2017-12-16 VITALS — BP 120/70 | Ht 65.0 in | Wt 185.0 lb

## 2017-12-16 DIAGNOSIS — Z0000 Encounter for general adult medical examination without abnormal findings: Secondary | ICD-10-CM

## 2017-12-16 LAB — LIPID PANEL
Cholesterol: 249 mg/dL — ABNORMAL HIGH (ref 0–200)
HDL: 45 mg/dL (ref >40)
LDL Cholesterol: 177 mg/dL — ABNORMAL HIGH (ref 0–99)
TRIGLYCERIDES: 137 mg/dL (ref <150)
Total CHOL/HDL Ratio: 5.5 RATIO
VLDL: 27 mg/dL (ref 0–40)

## 2017-12-16 LAB — HEMOGLOBIN A1C
Hgb A1c MFr Bld: 6 % — ABNORMAL HIGH (ref 4.8–5.6)
Mean Plasma Glucose: 125.5 mg/dL

## 2017-12-16 NOTE — Progress Notes (Signed)
[facility] initial screening  Clinical Measurement:  Height: 65in   Weight: 185lb  Blood Pressure: 122/74 Blood Pressure #2:  120/70 Fasting Labs Drawn Today, will review with patient when they result.  Medical History:  Patient states that she has  been diagnosed with high cholesterol and  high blood pressure.  Patient states that she has not been diagnosed with diabetes or heart disease.  Medications:  Patients states she is not taking any medications for high cholesterol, high blood pressure or diabetes.  She is not taking aspirin daily to prevent heart attack or stroke.    Blood pressure, self measurement:  Patients states she does not measure blood pressure at home.    Nutrition:  Patient states she eats 1 cup of fruit and 4 cups of vegetables in an average day.  Patient states she does not eat fish regularly, she eats more than half a serving of whole grains daily. She drinks less than 36 ounces of beverages with added sugar weekly.  She is currently watching her sodium intake.  She has not had any drinks containing alcohol in the last seven days.    Physical activity:  Patient states that she gets 30 minutes of moderate exercise in a week.  She gets 0 minutes of vigorous exercise per week.    Smoking status:  Patient states she has never smoked and is not around any smokers.    Quality of life:  Patient states that she has had 0 bad physical days out of the last 30 days. In the last 2 weeks, she has had a few  days that she has felt down or depressed. She has had a few days in the last 2 weeks that she has had little interest or pleasure in doing things.  Risk reduction and counseling:  Patient states she wants to lose weight and increase fruit and vegetable intake.  I encouraged her to continue with current exercise regimen and increase vegetable and fruit intake.  Navigation:  I will notify patient of lab results.  Patient is aware of 2 more health coaching sessions and a follow up.

## 2017-12-19 ENCOUNTER — Telehealth (HOSPITAL_COMMUNITY): Payer: Self-pay | Admitting: *Deleted

## 2017-12-19 NOTE — Telephone Encounter (Signed)
Health coaching 2   Labs-cholesterol 249, LDL cholesterol 177, triglycerides 137, HDL cholesterol 45, hemoglobin  A1C  6.0. Mean plasma glucose 125.5   Patient is aware and understands these lab results.  Goals-Patient states that will start an exercise regimen.  I encouraged patient to exercise at least 150 minutes weekly.  Patient states that she drinks soda more that 3 times weekly. I encouraged patient to drink 8 to 10 glasses of water daily and if wanted tea, to use Stevia as a substitute for sugar.  Navigation:  Patient is aware of 1 more health coaching sessions and a follow up.  Time 10 minutes

## 2017-12-26 ENCOUNTER — Encounter (HOSPITAL_COMMUNITY): Payer: Self-pay | Admitting: *Deleted

## 2017-12-26 NOTE — Progress Notes (Signed)
Letter mailed to patient with negative pap smear results. HPV was negative. Next pap smear due in five years. 

## 2018-03-16 ENCOUNTER — Other Ambulatory Visit: Payer: Self-pay

## 2018-03-16 ENCOUNTER — Encounter (HOSPITAL_BASED_OUTPATIENT_CLINIC_OR_DEPARTMENT_OTHER): Payer: Self-pay | Admitting: Emergency Medicine

## 2018-03-16 ENCOUNTER — Emergency Department (HOSPITAL_BASED_OUTPATIENT_CLINIC_OR_DEPARTMENT_OTHER): Payer: No Typology Code available for payment source

## 2018-03-16 ENCOUNTER — Emergency Department (HOSPITAL_BASED_OUTPATIENT_CLINIC_OR_DEPARTMENT_OTHER)
Admission: EM | Admit: 2018-03-16 | Discharge: 2018-03-16 | Disposition: A | Discharge status: Home / Self Care | Payer: No Typology Code available for payment source | Attending: Emergency Medicine | Admitting: Emergency Medicine

## 2018-03-16 DIAGNOSIS — M25469 Effusion, unspecified knee: Secondary | ICD-10-CM

## 2018-03-16 DIAGNOSIS — M25561 Pain in right knee: Secondary | ICD-10-CM

## 2018-03-16 DIAGNOSIS — I10 Essential (primary) hypertension: Secondary | ICD-10-CM | POA: Insufficient documentation

## 2018-03-16 DIAGNOSIS — M25461 Effusion, right knee: Secondary | ICD-10-CM | POA: Insufficient documentation

## 2018-03-16 MED ORDER — CELECOXIB 400 MG PO CAPS: 400.0000 mg | ORAL_CAPSULE | Freq: Every day | ORAL | 0 refills | Status: DC

## 2018-03-16 MED ORDER — DICLOFENAC SODIUM 1 % TD GEL: 2.0000 g | Freq: Four times a day (QID) | TRANSDERMAL | 0 refills | Status: DC

## 2018-03-16 NOTE — ED Triage Notes (Signed)
Pt c/o 6/10 right knee pain ans swollen that started today, pt denies any injury.

## 2018-03-16 NOTE — Discharge Instructions (Signed)
Take Celebrex daily for pain and inflammation. Use Voltaren gel to help with pain and swelling. Use the knee sleeve to help with support. Use ice, 20 minutes at a time, 3-4 times a day to help with pain and swelling. If your symptoms not improving in the next week, follow-up with orthopedics for further evaluation of your symptoms. Return to the emergency room if you develop high fevers, if your knee becomes very red and you are unable to move it, or with any new, worsening, concerning symptoms.

## 2018-03-16 NOTE — ED Provider Notes (Signed)
[facility] EMERGENCY DEPARTMENT Provider Note   CSN: [id number] Arrival date & time: 03/16/18  1933     History   Chief Complaint Chief Complaint  Patient presents with  . Knee Pain    HPI Diane Sexton is a 48 y.o. female resenting for evaluation of right knee pain.  Patient states that she noticed gradually worsening right knee pain and swelling.  She denies precipitating event.  Pain is constant and throbbing, nothing makes it better.  Symptoms are worse when she stands and walks.  She denies fall, trauma, or injury.  However, several days ago, she slept in a weird position in which her knee was flexed the entire time.  She denies history of knee problems.  She denies fevers, chills, redness of the knee, numbness, or tingling.  She denies history of gout.  She denies history of blood clots, recent travel, surgeries, immobilization, history of cancer, or OCP use.  She has not taken anything for her symptoms including Tylenol or ibuprofen.   HPI  Past Medical History:  Diagnosis Date  . Constipation   . Depression    bi-polar  . DUB (dysfunctional uterine bleeding)   . Fibroids   . Hyperlipemia   . Hypertension   . PMDD (premenstrual dysphoric disorder)     Patient Active Problem List   Diagnosis Date Noted  . Bacterial vaginitis 08/25/2012  . Essential hypertension, benign 05/11/2012  . Overweight (BMI 25.0-29.9) 05/11/2012  . Seborrheic keratosis 05/11/2012  . Metatarsalgia of left foot 12/15/2011  . LGSIL (low grade squamous intraepithelial dysplasia) 07/26/2011  . Bipolar II disorder ([place]) 05/11/2011    Class: Chronic  . Obesity 04/05/2011  . Chest pain, atypical 11/18/2010  . Fibroids 09/18/2010  . Elevated TSH 05/22/2010  . PMDD 12/24/2009  . HYPERLIPIDEMIA 06/09/2009  . DEGENERATIVE JOINT DISEASE, SHOULDER 04/28/2009  . BACK PAIN 04/28/2009    Past Surgical History:  Procedure Laterality Date  . APPENDECTOMY  2009  . [place]  2008      OB History    Gravida  3   Para      Term      Preterm      AB  3   Living  0     SAB      TAB  3   Ectopic      Multiple      Live Births               Home Medications    Prior to Admission medications   Medication Sig Start Date End Date Taking? Authorizing Provider  benzonatate (TESSALON PERLES) 100 MG capsule Take 1 capsule (100 mg total) by mouth 3 (three) times daily as needed for cough. Patient not taking: Reported on 11/29/2017 02/23/17   Macarthur Critchley, MD  celecoxib (CELEBREX) 400 MG capsule Take 1 capsule (400 mg total) by mouth daily after breakfast. 03/16/18   Raychelle Hudman, PA-C  diclofenac sodium (VOLTAREN) 1 % GEL Apply 2 g topically 4 (four) times daily. 03/16/18   Aneya Daddona, PA-C  guaiFENesin-codeine 100-10 MG/5ML syrup Take 5 mLs by mouth every 6 (six) hours as needed for cough. Patient not taking: Reported on 11/29/2017 02/23/17   Mackuen, Courteney Lyn, MD  hydrochlorothiazide (HYDRODIURIL) 12.5 MG tablet TAKE 1 TABLET BY MOUTH EVERY DAY Patient not taking: Reported on 11/04/2016 06/03/15   Frazier Richards, MD  methocarbamol (ROBAXIN) 500 MG tablet Take 1 tablet (500 mg total) by mouth 2 (two)  times daily. Patient not taking: Reported on 11/29/2017 04/20/17   Volanda Napoleon, PA-C  oxyCODONE-acetaminophen (PERCOCET) 5-325 MG tablet Take 1 tablet by mouth every 4 (four) hours as needed. Patient not taking: Reported on 11/04/2016 08/15/16   Orpah Greek, MD    Family History Family History  Problem Relation Age of Onset  . Hypertension Mother   . Hypertension Father   . Diabetes Father   . Diabetes Mellitus II Father   . Diabetes Mellitus II Paternal Aunt   . Diabetes Mellitus II Paternal Aunt   . Diabetes Mellitus II Paternal Uncle   . Dementia Maternal Grandmother   . Heart attack Maternal Grandmother 48  . Diabetes Mellitus II Paternal Uncle     Social History Social History   Tobacco Use  . Smoking  status: Never Smoker  . Smokeless tobacco: Never Used  Substance Use Topics  . Alcohol use: Yes    Alcohol/week: 1.0 standard drinks    Types: 1 Standard drinks or equivalent per week    Comment: occasionally  . Drug use: No     Allergies   Oxycodone; Cyclobenzaprine; and Hydrocodone-apap-dietary prod   Review of Systems Review of Systems  Musculoskeletal: Positive for arthralgias and joint swelling.  Neurological: Negative for numbness.     Physical Exam Updated Vital Signs BP (!) 147/92 (BP Location: Left Arm)   Pulse 81   Temp 98.4 F (36.9 C) (Oral)   Resp 18   Ht 5\' 5"  (1.651 m)   Wt 79.4 kg   LMP 03/09/2018   SpO2 96%   BMI 29.12 kg/m   Physical Exam Vitals signs and nursing note reviewed.  Constitutional:      General: She is not in acute distress.    Appearance: She is well-developed.  HENT:     Head: Normocephalic and atraumatic.  Neck:     Musculoskeletal: Normal range of motion.  Pulmonary:     Effort: Pulmonary effort is normal.  Abdominal:     General: There is no distension.  Musculoskeletal:        General: Swelling and tenderness present.     Comments: Swelling noted of the right knee, worse on the lateral aspect.  No erythema or warmth.  Decreased active range of motion due to pain.  No pain with varus or valgus stress.  Tenderness palpation of the posterior and lateral knee, minimal tenderness with palpation of the medial knee.  No tenderness palpation of the calf or thigh.  Pedal pulses intact.  Sensation intact.  Skin:    General: Skin is warm.     Capillary Refill: Capillary refill takes less than 2 seconds.     Findings: No rash.  Neurological:     Mental Status: She is alert and oriented to person, place, and time.      ED Treatments / Results  Labs (all labs ordered are listed, but only abnormal results are displayed) Labs Reviewed - No data to display  EKG None  Radiology Dg Knee Complete 4 Views Right  Result Date:  03/16/2018 CLINICAL DATA:  Rt knee pain and swelling entirely sudden onset x today. Pt denies old or recent injury. Shielded EXAM: RIGHT KNEE - COMPLETE 4+ VIEW COMPARISON:  None. FINDINGS: No acute fracture or subluxation. Small joint effusion is present. IMPRESSION: 1. Small joint effusion. 2. No acute fracture. Electronically Signed   By: Nolon Nations M.D.   On: 03/16/2018 20:04    Procedures Procedures (including critical care time)  Medications Ordered in ED Medications - No data to display   Initial Impression / Assessment and Plan / ED Course  I have reviewed the triage vital signs and the nursing notes.  Pertinent labs & imaging results that were available during my care of the patient were reviewed by me and considered in my medical decision making (see chart for details).     presenting for evaluation of right knee pain and swelling.  Physical exam reassuring, she is neurovascularly intact.  X-ray viewed interpreted by me, no fracture dislocation.  Does show an effusion.  Exam reassuring, no fevers, erythema, or warmth.  As such, low suspicion for gout or septic knee.  However, considering no precipitating event, likely inflammatory process.  Consider this may be due to sleeping in a flexed position.  Will have patient continue taking Celebrex daily, use Voltaren gel, and knee sleeve.  Discussed use of ice and rest.  Follow-up with Ortho as needed.  At this time, patient appears safe for discharge.  Return precautions given.  Patient states she understands and agrees plan.   Final Clinical Impressions(s) / ED Diagnoses   Final diagnoses:  Acute pain of right knee  Knee swelling    ED Discharge Orders         Ordered    diclofenac sodium (VOLTAREN) 1 % GEL  4 times daily     03/16/18 2201    celecoxib (CELEBREX) 400 MG capsule  Daily after breakfast     03/16/18 2203           Franchot Heidelberg, PA-C 03/17/18 0003    Margette Fast, MD 03/17/18 1030

## 2018-04-04 ENCOUNTER — Other Ambulatory Visit: Payer: Self-pay

## 2018-04-04 ENCOUNTER — Encounter (HOSPITAL_BASED_OUTPATIENT_CLINIC_OR_DEPARTMENT_OTHER): Payer: Self-pay | Admitting: Emergency Medicine

## 2018-04-04 ENCOUNTER — Emergency Department (HOSPITAL_BASED_OUTPATIENT_CLINIC_OR_DEPARTMENT_OTHER): Payer: Self-pay

## 2018-04-04 ENCOUNTER — Emergency Department (HOSPITAL_BASED_OUTPATIENT_CLINIC_OR_DEPARTMENT_OTHER)
Admission: EM | Admit: 2018-04-04 | Discharge: 2018-04-04 | Disposition: A | Discharge status: Home / Self Care | Payer: Self-pay | Attending: Emergency Medicine | Admitting: Emergency Medicine

## 2018-04-04 DIAGNOSIS — Y999 Unspecified external cause status: Secondary | ICD-10-CM | POA: Insufficient documentation

## 2018-04-04 DIAGNOSIS — S29012A Strain of muscle and tendon of back wall of thorax, initial encounter: Secondary | ICD-10-CM | POA: Insufficient documentation

## 2018-04-04 DIAGNOSIS — S40012A Contusion of left shoulder, initial encounter: Secondary | ICD-10-CM | POA: Insufficient documentation

## 2018-04-04 DIAGNOSIS — Z79899 Other long term (current) drug therapy: Secondary | ICD-10-CM | POA: Insufficient documentation

## 2018-04-04 DIAGNOSIS — Y9259 Other trade areas as the place of occurrence of the external cause: Secondary | ICD-10-CM | POA: Insufficient documentation

## 2018-04-04 DIAGNOSIS — I10 Essential (primary) hypertension: Secondary | ICD-10-CM | POA: Insufficient documentation

## 2018-04-04 DIAGNOSIS — W228XXA Striking against or struck by other objects, initial encounter: Secondary | ICD-10-CM | POA: Insufficient documentation

## 2018-04-04 DIAGNOSIS — Y939 Activity, unspecified: Secondary | ICD-10-CM | POA: Insufficient documentation

## 2018-04-04 MED ORDER — TRAMADOL HCL 50 MG PO TABS: 50.0000 mg | ORAL_TABLET | Freq: Four times a day (QID) | ORAL | 0 refills | Status: DC | PRN
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
## 2018-04-04 NOTE — Discharge Instructions (Addendum)
Ibuprofen 600 mg every 6 hours as needed for pain.  Tramadol as prescribed as needed for pain not relieved with ibuprofen.  Follow-up with your primary doctor if symptoms or not improving in the next week.

## 2018-04-04 NOTE — ED Notes (Signed)
PT ambulated out of room and states " the xrays are taking to long, I need to go, I just want pain medication and leave". MD informed. Pt re-assured that MD would be informed and we would come back in as soon as he was able.

## 2018-04-04 NOTE — ED Notes (Signed)
Patient transported to X-ray 
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
## 2018-05-06 ENCOUNTER — Observation Stay (HOSPITAL_BASED_OUTPATIENT_CLINIC_OR_DEPARTMENT_OTHER)
Admission: EM | Admit: 2018-05-06 | Discharge: 2018-05-07 | Disposition: A | Discharge status: Home / Self Care | Payer: HRSA Program | Attending: Internal Medicine | Admitting: Internal Medicine

## 2018-05-06 ENCOUNTER — Emergency Department (HOSPITAL_BASED_OUTPATIENT_CLINIC_OR_DEPARTMENT_OTHER): Payer: HRSA Program

## 2018-05-06 ENCOUNTER — Encounter (HOSPITAL_BASED_OUTPATIENT_CLINIC_OR_DEPARTMENT_OTHER): Payer: Self-pay | Admitting: *Deleted

## 2018-05-06 ENCOUNTER — Other Ambulatory Visit: Payer: Self-pay

## 2018-05-06 DIAGNOSIS — R0902 Hypoxemia: Secondary | ICD-10-CM | POA: Insufficient documentation

## 2018-05-06 DIAGNOSIS — R9431 Abnormal electrocardiogram [ECG] [EKG]: Secondary | ICD-10-CM | POA: Diagnosis not present

## 2018-05-06 DIAGNOSIS — R112 Nausea with vomiting, unspecified: Secondary | ICD-10-CM | POA: Insufficient documentation

## 2018-05-06 DIAGNOSIS — Z833 Family history of diabetes mellitus: Secondary | ICD-10-CM | POA: Insufficient documentation

## 2018-05-06 DIAGNOSIS — F3181 Bipolar II disorder: Secondary | ICD-10-CM | POA: Diagnosis not present

## 2018-05-06 DIAGNOSIS — E785 Hyperlipidemia, unspecified: Secondary | ICD-10-CM | POA: Diagnosis not present

## 2018-05-06 DIAGNOSIS — E669 Obesity, unspecified: Secondary | ICD-10-CM | POA: Insufficient documentation

## 2018-05-06 DIAGNOSIS — Z79899 Other long term (current) drug therapy: Secondary | ICD-10-CM | POA: Insufficient documentation

## 2018-05-06 DIAGNOSIS — Z6827 Body mass index (BMI) 27.0-27.9, adult: Secondary | ICD-10-CM | POA: Insufficient documentation

## 2018-05-06 DIAGNOSIS — Z791 Long term (current) use of non-steroidal anti-inflammatories (NSAID): Secondary | ICD-10-CM | POA: Insufficient documentation

## 2018-05-06 DIAGNOSIS — I10 Essential (primary) hypertension: Secondary | ICD-10-CM | POA: Diagnosis not present

## 2018-05-06 DIAGNOSIS — J069 Acute upper respiratory infection, unspecified: Secondary | ICD-10-CM | POA: Diagnosis present

## 2018-05-06 LAB — BASIC METABOLIC PANEL
Anion gap: 10 (ref 5–15)
BUN: 11 mg/dL (ref 6–20)
CO2: 29 mmol/L (ref 22–32)
Calcium: 8.4 mg/dL — ABNORMAL LOW (ref 8.9–10.3)
Chloride: 96 mmol/L — ABNORMAL LOW (ref 98–111)
Creatinine, Ser: 0.76 mg/dL (ref 0.44–1.00)
GFR calc Af Amer: >60 mL/min (ref >60)
GFR calc non Af Amer: >60 mL/min (ref >60)
Glucose, Bld: 98 mg/dL (ref 70–99)
Potassium: 3.1 mmol/L — ABNORMAL LOW (ref 3.5–5.1)
Sodium: 135 mmol/L (ref 135–145)

## 2018-05-06 LAB — CBC WITH DIFFERENTIAL/PLATELET
Abs Immature Granulocytes: 0.05 10*3/uL (ref 0.00–0.07)
Basophils Absolute: 0 10*3/uL (ref 0.0–0.1)
Basophils Relative: 0 %
Eosinophils Absolute: 0.1 10*3/uL (ref 0.0–0.5)
Eosinophils Relative: 1 %
HCT: 41.7 % (ref 36.0–46.0)
Hemoglobin: 13.6 g/dL (ref 12.0–15.0)
Immature Granulocytes: 1 %
Lymphocytes Relative: 26 %
Lymphs Abs: 1.9 10*3/uL (ref 0.7–4.0)
MCH: 26.3 pg (ref 26.0–34.0)
MCHC: 32.6 g/dL (ref 30.0–36.0)
MCV: 80.5 fL (ref 80.0–100.0)
Monocytes Absolute: 0.8 10*3/uL (ref 0.1–1.0)
Monocytes Relative: 11 %
Neutro Abs: 4.6 10*3/uL (ref 1.7–7.7)
Neutrophils Relative %: 61 %
Platelets: 358 10*3/uL (ref 150–400)
RBC: 5.18 MIL/uL — ABNORMAL HIGH (ref 3.87–5.11)
RDW: 13.5 % (ref 11.5–15.5)
WBC: 7.4 10*3/uL (ref 4.0–10.5)
nRBC: 0 % (ref 0.0–0.2)

## 2018-05-06 MED ORDER — SODIUM CHLORIDE 0.9 % IV BOLUS
1000.0000 mL | Freq: Once | INTRAVENOUS | Status: AC
Start: 2018-05-06 — End: 2018-05-06
  Administered 2018-05-06: 20:00:00 1000 mL via INTRAVENOUS

## 2018-05-06 MED ORDER — ONDANSETRON HCL 4 MG/2ML IJ SOLN: 4.0000 mg | Freq: Once | INTRAMUSCULAR | Status: DC

## 2018-05-06 MED ORDER — ONDANSETRON HCL 4 MG/2ML IJ SOLN
4.0000 mg | Freq: Once | INTRAMUSCULAR | Status: AC
  Administered 2018-05-06: 20:00:00 4 mg via INTRAVENOUS
  Filled 2018-05-06: qty 2

## 2018-05-06 NOTE — ED Notes (Signed)
Pt stated that her stomach felt the same as before. Has attempted PO challenge of ginger ale. Pt has not vomited thus far. Will continue to monitor

## 2018-05-06 NOTE — ED Notes (Signed)
Pt was walked around the room to see if she became short of breath. The patient walked approx 1.5 minutes and told RN that she needed to stop. Pt was dyspneic. She was able to speak several words at a time before having to breath. States this is not her normal. When pt sat down and pulse ox applied Sat of 87% and HR 108. Pt recovered shortly after to Sat of 90% and HR 88. MD notified and 2L [place] canula applied per order. Will continue to monitor.

## 2018-05-06 NOTE — ED Triage Notes (Signed)
Pt tested for covid19 11 days ago.  States that she received positive results 4 days ago.  Reports SOB has increased today. No obvious distress noted.

## 2018-05-06 NOTE — Plan of Care (Signed)
Patient with confirmed COVID.  Apparently needing 2L O2.

## 2018-05-06 NOTE — ED Notes (Signed)
[facility] notified (Tammy) - patient ready for transport 

## 2018-05-06 NOTE — ED Provider Notes (Addendum)
[facility] EMERGENCY DEPARTMENT Provider Note   CSN: [id number] Arrival date & time: 05/06/18  1853    History   Chief Complaint Chief Complaint  Patient presents with  . Shortness of Breath    HPI Diane Sexton is a 48 y.o. female.     Patient is a 48 year old female with a history of hypertension who presents with vomiting and shortness of breath.  She has been having fever and upper respiratory symptoms since around March 24.  She went to a cobra testing clinic at that time and had [place] testing.  She had results about 4 days ago which was positive.  She states she is just been feeling achy and fatigued.  She has had some shortness of breath.  Today she was having nausea and vomiting and was unable to keep anything down.  She denies abdominal pain.  No significant chest congestion.  No abdominal pain.     Past Medical History:  Diagnosis Date  . Constipation   . Depression    bi-polar  . DUB (dysfunctional uterine bleeding)   . Fibroids   . Hyperlipemia   . Hypertension   . PMDD (premenstrual dysphoric disorder)     Patient Active Problem List   Diagnosis Date Noted  . Acute respiratory disease due to COVID-19 virus 05/06/2018  . Bacterial vaginitis 08/25/2012  . Essential hypertension, benign 05/11/2012  . Overweight (BMI 25.0-29.9) 05/11/2012  . Seborrheic keratosis 05/11/2012  . Metatarsalgia of left foot 12/15/2011  . LGSIL (low grade squamous intraepithelial dysplasia) 07/26/2011  . Bipolar II disorder ([place]) 05/11/2011    Class: Chronic  . Obesity 04/05/2011  . Chest pain, atypical 11/18/2010  . Fibroids 09/18/2010  . Elevated TSH 05/22/2010  . PMDD 12/24/2009  . HYPERLIPIDEMIA 06/09/2009  . DEGENERATIVE JOINT DISEASE, SHOULDER 04/28/2009  . BACK PAIN 04/28/2009    Past Surgical History:  Procedure Laterality Date  . APPENDECTOMY  2009  . [place]  2008     OB History    Gravida  3   Para      Term      Preterm      AB  3   Living  0     SAB      TAB  3   Ectopic      Multiple      Live Births               Home Medications    Prior to Admission medications   Medication Sig Start Date End Date Taking? Authorizing Provider  benzonatate (TESSALON PERLES) 100 MG capsule Take 1 capsule (100 mg total) by mouth 3 (three) times daily as needed for cough. Patient not taking: Reported on 11/29/2017 02/23/17   Macarthur Critchley, MD  celecoxib (CELEBREX) 400 MG capsule Take 1 capsule (400 mg total) by mouth daily after breakfast. 03/16/18   Caccavale, Sophia, PA-C  diclofenac sodium (VOLTAREN) 1 % GEL Apply 2 g topically 4 (four) times daily. 03/16/18   Caccavale, Sophia, PA-C  guaiFENesin-codeine 100-10 MG/5ML syrup Take 5 mLs by mouth every 6 (six) hours as needed for cough. Patient not taking: Reported on 11/29/2017 02/23/17   Mackuen, Courteney Lyn, MD  hydrochlorothiazide (HYDRODIURIL) 12.5 MG tablet TAKE 1 TABLET BY MOUTH EVERY DAY Patient not taking: Reported on 11/04/2016 06/03/15   Frazier Richards, MD  methocarbamol (ROBAXIN) 500 MG tablet Take 1 tablet (500 mg total) by mouth 2 (two) times daily. Patient not taking: Reported on  11/29/2017 04/20/17   Volanda Napoleon, PA-C  oxyCODONE-acetaminophen (PERCOCET) 5-325 MG tablet Take 1 tablet by mouth every 4 (four) hours as needed. Patient not taking: Reported on 11/04/2016 08/15/16   Orpah Greek, MD  traMADol (ULTRAM) 50 MG tablet Take 1 tablet (50 mg total) by mouth every 6 (six) hours as needed. 04/04/18   Veryl Speak, MD    Family History Family History  Problem Relation Age of Onset  . Hypertension Mother   . Hypertension Father   . Diabetes Father   . Diabetes Mellitus II Father   . Diabetes Mellitus II Paternal Aunt   . Diabetes Mellitus II Paternal Aunt   . Diabetes Mellitus II Paternal Uncle   . Dementia Maternal Grandmother   . Heart attack Maternal Grandmother 48  . Diabetes Mellitus II Paternal Uncle     Social History  Social History   Tobacco Use  . Smoking status: Never Smoker  . Smokeless tobacco: Never Used  Substance Use Topics  . Alcohol use: Yes    Alcohol/week: 1.0 standard drinks    Types: 1 Standard drinks or equivalent per week    Comment: occasionally  . Drug use: No     Allergies   Oxycodone; Cyclobenzaprine; and Hydrocodone-apap-dietary prod   Review of Systems Review of Systems  Constitutional: Positive for fatigue and fever. Negative for chills and diaphoresis.  HENT: Positive for congestion. Negative for rhinorrhea and sneezing.   Eyes: Negative.   Respiratory: Positive for cough and shortness of breath. Negative for chest tightness.   Cardiovascular: Negative for chest pain and leg swelling.  Gastrointestinal: Positive for nausea and vomiting. Negative for abdominal pain, blood in stool and diarrhea.  Genitourinary: Negative for difficulty urinating, flank pain, frequency and hematuria.  Musculoskeletal: Positive for myalgias. Negative for arthralgias and back pain.  Skin: Negative for rash.  Neurological: Negative for dizziness, speech difficulty, weakness, numbness and headaches.     Physical Exam Updated Vital Signs BP 137/84   Pulse 73   Temp 98.6 F (37 C) (Oral)   Resp 20   Ht 5' 5.5" (1.664 m)   Wt 77.1 kg   LMP 04/07/2018   SpO2 97%   BMI 27.86 kg/m   Physical Exam Constitutional:      Appearance: She is well-developed.  HENT:     Head: Normocephalic and atraumatic.  Eyes:     Pupils: Pupils are equal, round, and reactive to light.  Neck:     Musculoskeletal: Normal range of motion and neck supple.  Cardiovascular:     Rate and Rhythm: Normal rate and regular rhythm.  Pulmonary:     Effort: Pulmonary effort is normal. No tachypnea, accessory muscle usage or respiratory distress.     Breath sounds: No wheezing or rales.  Chest:     Chest wall: No tenderness.  Abdominal:     General: Bowel sounds are normal.     Palpations: Abdomen is soft.      Tenderness: There is no abdominal tenderness. There is no guarding or rebound.  Musculoskeletal: Normal range of motion.  Lymphadenopathy:     Cervical: No cervical adenopathy.  Skin:    General: Skin is warm and dry.     Findings: No rash.  Neurological:     Mental Status: She is alert and oriented to person, place, and time.      ED Treatments / Results  Labs (all labs ordered are listed, but only abnormal results are displayed) Labs Reviewed  BASIC METABOLIC PANEL - Abnormal; Notable for the following components:      Result Value   Potassium 3.1 (*)    Chloride 96 (*)    Calcium 8.4 (*)    All other components within normal limits  CBC WITH DIFFERENTIAL/PLATELET - Abnormal; Notable for the following components:   RBC 5.18 (*)    All other components within normal limits    EKG None  Radiology Dg Chest Port 1 View  Result Date: 05/06/2018 CLINICAL DATA:  48 year old female with shortness of breath. EXAM: PORTABLE CHEST 1 VIEW COMPARISON:  Chest radiograph dated 04/28/2018 FINDINGS: Shallow inspiration with bibasilar atelectasis. Left lung base densities may represent atelectasis although developing infiltrate is not excluded. Clinical correlation is recommended. No pleural effusion or pneumothorax. Stable cardiac silhouette. No acute osseous pathology. IMPRESSION: Shallow inspiration with bibasilar atelectasis. Developing infiltrate at the left lung base is not excluded. Clinical correlation recommended. Electronically Signed   By: Anner Crete M.D.   On: 05/06/2018 22:32    Procedures Procedures (including critical care time)  Medications Ordered in ED Medications  sodium chloride 0.9 % bolus 1,000 mL (0 mLs Intravenous Stopped 05/06/18 2118)  ondansetron (ZOFRAN) injection 4 mg (4 mg Intravenous Given 05/06/18 1942)     Initial Impression / Assessment and Plan / ED Course  I have reviewed the triage vital signs and the nursing notes.  Pertinent labs & imaging  results that were available during my care of the patient were reviewed by me and considered in my medical decision making (see chart for details).        Patient is a 48 year old female who has a positive diagnosis of COVID 19.  She has had symptoms since March 24.  Recently she is gotten more short of breath over the last couple days.  She had ongoing nausea and vomiting today.  She was given IV fluids and antiemetics in the ED and is still nauseated but has no vomiting.  She was noted to be hypoxic with an oxygen saturation of 89% lying in bed.  We walked her around in the room and her sats dropped to 87%.  She got short of breath and tachypneic.  She was placed on nasal cannula oxygen.  Given this, I feel that she would benefit from hospitalization.  I spoke with Dr. Alcario Drought at [facility] who is accepted the patient for transfer.  Her other labs are non-concerning.   CRITICAL CARE Performed by: Malvin Johns Total critical care time: 45 minutes Critical care time was exclusive of separately billable procedures and treating other patients. Critical care was necessary to treat or prevent imminent or life-threatening deterioration. Critical care was time spent personally by me on the following activities: development of treatment plan with patient and/or surrogate as well as nursing, discussions with consultants, evaluation of patient's response to treatment, examination of patient, obtaining history from patient or surrogate, ordering and performing treatments and interventions, ordering and review of laboratory studies, ordering and review of radiographic studies, pulse oximetry and re-evaluation of patient's condition.  Final Clinical Impressions(s) / ED Diagnoses   Final diagnoses:  2019 novel coronavirus disease (COVID-19)  Hypoxia    ED Discharge Orders    None       Malvin Johns, MD 05/06/18 6599    Malvin Johns, MD 05/06/18 2236

## 2018-05-07 ENCOUNTER — Other Ambulatory Visit: Payer: Self-pay

## 2018-05-07 DIAGNOSIS — R0902 Hypoxemia: Secondary | ICD-10-CM | POA: Diagnosis present

## 2018-05-07 DIAGNOSIS — E669 Obesity, unspecified: Secondary | ICD-10-CM | POA: Diagnosis not present

## 2018-05-07 DIAGNOSIS — Z79899 Other long term (current) drug therapy: Secondary | ICD-10-CM | POA: Diagnosis not present

## 2018-05-07 DIAGNOSIS — R112 Nausea with vomiting, unspecified: Secondary | ICD-10-CM | POA: Diagnosis not present

## 2018-05-07 DIAGNOSIS — Z6827 Body mass index (BMI) 27.0-27.9, adult: Secondary | ICD-10-CM | POA: Diagnosis not present

## 2018-05-07 DIAGNOSIS — Z833 Family history of diabetes mellitus: Secondary | ICD-10-CM | POA: Diagnosis not present

## 2018-05-07 DIAGNOSIS — E785 Hyperlipidemia, unspecified: Secondary | ICD-10-CM | POA: Diagnosis not present

## 2018-05-07 DIAGNOSIS — R9431 Abnormal electrocardiogram [ECG] [EKG]: Secondary | ICD-10-CM | POA: Diagnosis not present

## 2018-05-07 DIAGNOSIS — Z791 Long term (current) use of non-steroidal anti-inflammatories (NSAID): Secondary | ICD-10-CM | POA: Diagnosis not present

## 2018-05-07 DIAGNOSIS — J069 Acute upper respiratory infection, unspecified: Secondary | ICD-10-CM

## 2018-05-07 DIAGNOSIS — I10 Essential (primary) hypertension: Secondary | ICD-10-CM

## 2018-05-07 DIAGNOSIS — F3181 Bipolar II disorder: Secondary | ICD-10-CM | POA: Diagnosis not present

## 2018-05-07 LAB — PROCALCITONIN: Procalcitonin: 0.42 ng/mL

## 2018-05-07 LAB — C-REACTIVE PROTEIN: CRP: 6.3 mg/dL — ABNORMAL HIGH (ref <1.0)

## 2018-05-07 LAB — D-DIMER, QUANTITATIVE (NOT AT ARMC): D-Dimer, Quant: >20 ug/mL-FEU — ABNORMAL HIGH (ref 0.00–0.50)

## 2018-05-07 MED ORDER — VITAMIN C 500 MG PO TABS
500.0000 mg | ORAL_TABLET | Freq: Every day | ORAL | Status: DC
  Administered 2018-05-07: 500 mg via ORAL
  Filled 2018-05-07: qty 1

## 2018-05-07 MED ORDER — HYDROXYCHLOROQUINE SULFATE 200 MG PO TABS: 200.0000 mg | ORAL_TABLET | Freq: Two times a day (BID) | ORAL | Status: DC

## 2018-05-07 MED ORDER — GUAIFENESIN-DM 100-10 MG/5ML PO SYRP: 10.0000 mL | ORAL_SOLUTION | ORAL | Status: DC | PRN

## 2018-05-07 MED ORDER — ONDANSETRON 4 MG PO TBDP: 4.0000 mg | ORAL_TABLET | Freq: Three times a day (TID) | ORAL | 0 refills | Status: DC | PRN

## 2018-05-07 MED ORDER — ACETAMINOPHEN 325 MG PO TABS: 650.0000 mg | ORAL_TABLET | Freq: Four times a day (QID) | ORAL | Status: DC | PRN

## 2018-05-07 MED ORDER — ALBUTEROL SULFATE HFA 108 (90 BASE) MCG/ACT IN AERS: 2.0000 | INHALATION_SPRAY | RESPIRATORY_TRACT | Status: DC | PRN

## 2018-05-07 MED ORDER — ENOXAPARIN SODIUM 40 MG/0.4ML ~~LOC~~ SOLN
40.0000 mg | Freq: Every day | SUBCUTANEOUS | Status: DC
  Administered 2018-05-07: 40 mg via SUBCUTANEOUS
  Filled 2018-05-07: qty 0.4

## 2018-05-07 MED ORDER — SODIUM CHLORIDE 0.9 % IV SOLN: INTRAVENOUS | Status: DC

## 2018-05-07 MED ORDER — ZINC SULFATE 220 (50 ZN) MG PO CAPS
220.0000 mg | ORAL_CAPSULE | Freq: Every day | ORAL | Status: DC
  Administered 2018-05-07: 220 mg via ORAL
  Filled 2018-05-07: qty 1

## 2018-05-07 MED ORDER — HYDROXYCHLOROQUINE SULFATE 200 MG PO TABS
400.0000 mg | ORAL_TABLET | Freq: Two times a day (BID) | ORAL | Status: DC
  Filled 2018-05-07: qty 2

## 2018-05-07 MED ORDER — HYDROXYCHLOROQUINE SULFATE 200 MG PO TABS: 400.0000 mg | ORAL_TABLET | Freq: Two times a day (BID) | ORAL | Status: DC

## 2018-05-07 MED ORDER — ONDANSETRON HCL 4 MG/2ML IJ SOLN: 4.0000 mg | Freq: Four times a day (QID) | INTRAMUSCULAR | Status: DC | PRN

## 2018-05-07 MED ORDER — HYDROCOD POLST-CPM POLST ER 10-8 MG/5ML PO SUER: 5.0000 mL | Freq: Two times a day (BID) | ORAL | Status: DC | PRN

## 2018-05-07 MED ORDER — POTASSIUM CHLORIDE CRYS ER 20 MEQ PO TBCR: 40.0000 meq | EXTENDED_RELEASE_TABLET | Freq: Once | ORAL | Status: DC

## 2018-05-07 MED ORDER — ONDANSETRON HCL 4 MG PO TABS: 4.0000 mg | ORAL_TABLET | Freq: Four times a day (QID) | ORAL | Status: DC | PRN

## 2018-05-07 NOTE — Progress Notes (Signed)
Pt tolerated lunch without nausea. Pt feels she will be fine at home. Will prepare discharge as MD already ordered. SRP, RN

## 2018-05-07 NOTE — H&P (Signed)
History and Physical    Diane Sexton QQV:[id number] DOB: 07/15/1970 DOA: 05/06/2018  PCP: Patient, No Pcp Per  Patient coming from: Home  I have personally briefly reviewed patient's old medical records in [place]  Chief Complaint: COVID-19  HPI: Diane Sexton is a 48 y.o. female with medical history significant of HTN.  Not currently taking any meds.  Patient presents to [facility] with c/o vomiting and SOB.  Has been having fever and URI symptoms since around March 24th.  Went to [place] testing clinic and had COVID testing.  Results came back positive about 4 days ago.  Since that time just feeling achy and fatigued, some SOB.  Today having N/V unable to keep anything down.  Suspicious that the azithromycin that she was started on over at [facility] after visiting the ED there on the 27th may have caused this.   ED Course: In ED, N/V improved with zofran.  Noted to be slightly hypoxic satting low 90s on RA, and down into the 80s with ambulation.  EDP requests admission.   Review of Systems: As per HPI otherwise 10 point review of systems negative.   Past Medical History:  Diagnosis Date  . Constipation   . Depression    bi-polar  . DUB (dysfunctional uterine bleeding)   . Fibroids   . Hyperlipemia   . Hypertension   . PMDD (premenstrual dysphoric disorder)     Past Surgical History:  Procedure Laterality Date  . APPENDECTOMY  2009  . [place]  2008     reports that she has never smoked. She has never used smokeless tobacco. She reports current alcohol use of about 1.0 standard drinks of alcohol per week. She reports that she does not use drugs.  Allergies  Allergen Reactions  . Oxycodone Other (See Comments)    Upset stomach  . Cyclobenzaprine Nausea Only  . Hydrocodone-Apap-Dietary Prod Nausea And Vomiting    Family History  Problem Relation Age of Onset  . Hypertension Mother   . Hypertension Father   . Diabetes Father   . Diabetes Mellitus II Father   .  Diabetes Mellitus II Paternal Aunt   . Diabetes Mellitus II Paternal Aunt   . Diabetes Mellitus II Paternal Uncle   . Dementia Maternal Grandmother   . Heart attack Maternal Grandmother 48  . Diabetes Mellitus II Paternal Uncle      Prior to Admission medications   Medication Sig Start Date End Date Taking? Authorizing Provider  benzonatate (TESSALON PERLES) 100 MG capsule Take 1 capsule (100 mg total) by mouth 3 (three) times daily as needed for cough. Patient not taking: Reported on 11/29/2017 02/23/17   Macarthur Critchley, MD  celecoxib (CELEBREX) 400 MG capsule Take 1 capsule (400 mg total) by mouth daily after breakfast. 03/16/18   Caccavale, Sophia, PA-C  diclofenac sodium (VOLTAREN) 1 % GEL Apply 2 g topically 4 (four) times daily. 03/16/18   Caccavale, Sophia, PA-C  guaiFENesin-codeine 100-10 MG/5ML syrup Take 5 mLs by mouth every 6 (six) hours as needed for cough. Patient not taking: Reported on 11/29/2017 02/23/17   Mackuen, Courteney Lyn, MD  hydrochlorothiazide (HYDRODIURIL) 12.5 MG tablet TAKE 1 TABLET BY MOUTH EVERY DAY Patient not taking: Reported on 11/04/2016 06/03/15   Frazier Richards, MD  methocarbamol (ROBAXIN) 500 MG tablet Take 1 tablet (500 mg total) by mouth 2 (two) times daily. Patient not taking: Reported on 11/29/2017 04/20/17   Volanda Napoleon, PA-C  oxyCODONE-acetaminophen (PERCOCET) 5-325 MG  tablet Take 1 tablet by mouth every 4 (four) hours as needed. Patient not taking: Reported on 11/04/2016 08/15/16   Orpah Greek, MD  traMADol (ULTRAM) 50 MG tablet Take 1 tablet (50 mg total) by mouth every 6 (six) hours as needed. 04/04/18   Veryl Speak, MD    Physical Exam: Vitals:   05/06/18 2200 05/06/18 2330 05/06/18 2352 05/07/18 0101  BP: 137/84 128/86  (!) 142/91  Pulse: 73 67 67 68  Resp:  11 16 17   Temp:    98.2 F (36.8 C)  TempSrc:    Oral  SpO2: 97% 90% 92% 96%  Weight:      Height:        Constitutional: NAD, calm, comfortable Eyes: PERRL,  lids and conjunctivae normal ENMT: Mucous membranes are moist. Posterior pharynx clear of any exudate or lesions.Normal dentition.  Neck: normal, supple, no masses, no thyromegaly Respiratory: clear to auscultation bilaterally, no wheezing, no crackles. Normal respiratory effort. No accessory muscle use.  Cardiovascular: Regular rate and rhythm, no murmurs / rubs / gallops. No extremity edema. 2+ pedal pulses. No carotid bruits.  Abdomen: no tenderness, no masses palpated. No hepatosplenomegaly. Bowel sounds positive.  Musculoskeletal: no clubbing / cyanosis. No joint deformity upper and lower extremities. Good ROM, no contractures. Normal muscle tone.  Skin: no rashes, lesions, ulcers. No induration Neurologic: CN 2-12 grossly intact. Sensation intact, DTR normal. Strength 5/5 in all 4.  Psychiatric: Normal judgment and insight. Alert and oriented x 3. Normal mood.    Labs on Admission: I have personally reviewed following labs and imaging studies  CBC: Recent Labs  Lab 05/06/18 1940  WBC 7.4  NEUTROABS 4.6  HGB 13.6  HCT 41.7  MCV 80.5  PLT 630   Basic Metabolic Panel: Recent Labs  Lab 05/06/18 1940  NA 135  K 3.1*  CL 96*  CO2 29  GLUCOSE 98  BUN 11  CREATININE 0.76  CALCIUM 8.4*   GFR: Estimated Creatinine Clearance: 87.4 mL/min (by C-G formula based on SCr of 0.76 mg/dL). Liver Function Tests: No results for input(s): AST, ALT, ALKPHOS, BILITOT, PROT, ALBUMIN in the last 168 hours. No results for input(s): LIPASE, AMYLASE in the last 168 hours. No results for input(s): AMMONIA in the last 168 hours. Coagulation Profile: No results for input(s): INR, PROTIME in the last 168 hours. Cardiac Enzymes: No results for input(s): CKTOTAL, CKMB, CKMBINDEX, TROPONINI in the last 168 hours. BNP (last 3 results) No results for input(s): PROBNP in the last 8760 hours. HbA1C: No results for input(s): HGBA1C in the last 72 hours. CBG: No results for input(s): GLUCAP in the  last 168 hours. Lipid Profile: No results for input(s): CHOL, HDL, LDLCALC, TRIG, CHOLHDL, LDLDIRECT in the last 72 hours. Thyroid Function Tests: No results for input(s): TSH, T4TOTAL, FREET4, T3FREE, THYROIDAB in the last 72 hours. Anemia Panel: No results for input(s): VITAMINB12, FOLATE, FERRITIN, TIBC, IRON, RETICCTPCT in the last 72 hours. Urine analysis:    Component Value Date/Time   COLORURINE YELLOW 06/26/2010 0930   APPEARANCEUR CLEAR 06/26/2010 0930   LABSPEC 1.018 06/26/2010 0930   PHURINE 6.5 06/26/2010 0930   GLUCOSEU NEGATIVE 06/26/2010 0930   HGBUR TRACE (A) 06/26/2010 0930   BILIRUBINUR NEGATIVE 06/26/2010 0930   KETONESUR NEGATIVE 06/26/2010 0930   PROTEINUR NEGATIVE 06/26/2010 0930   UROBILINOGEN 0.2 06/26/2010 0930   NITRITE NEGATIVE 06/26/2010 0930   LEUKOCYTESUR NEGATIVE 06/26/2010 0930    Radiological Exams on Admission: Dg Chest [facility]  Result Date: 05/06/2018 CLINICAL DATA:  48 year old female with shortness of breath. EXAM: PORTABLE CHEST 1 VIEW COMPARISON:  Chest radiograph dated 04/28/2018 FINDINGS: Shallow inspiration with bibasilar atelectasis. Left lung base densities may represent atelectasis although developing infiltrate is not excluded. Clinical correlation is recommended. No pleural effusion or pneumothorax. Stable cardiac silhouette. No acute osseous pathology. IMPRESSION: Shallow inspiration with bibasilar atelectasis. Developing infiltrate at the left lung base is not excluded. Clinical correlation recommended. Electronically Signed   By: Anner Crete M.D.   On: 05/06/2018 22:32    EKG: Independently reviewed.  Assessment/Plan Principal Problem:   Acute respiratory disease due to COVID-19 virus Active Problems:   Essential hypertension, benign    1. Acute resp disease due to COVID-19 1. COVID pathway 2. Albuterol INH PRN 3. Adult wheeze protocol (though lungs sound pretty clear to me at the moment) 4. Vit C and Zinc 5.  Offered / suggested hydroxychloroquine to patient but patient has elected to decline this at this time. 1. Re-evaluate this if patient worsens 6. Tylenol PRN fever 7. Zofran PRN nausea 8. IVF: NS at 50 cc/hr (increase to 100 cc/hr if N/V comes back). 2. HTN - 1. Not currently taking any home BP meds per patient.  DVT prophylaxis: Lovenox Code Status: Full Family Communication: No family in room Disposition Plan: Home after admit Consults called: None Admission status: Place in 62    Shantrice Rodenberg, [place] [facility]  How to contact the [facility] Attending or Consulting provider [place] or covering provider during after hours [place], for this patient?  1. Check the care team in [facility] and look for a) attending/consulting [facility] provider listed and b) the [facility] team listed 2. Log into [email or web address]  Amion Physician Scheduling and messaging for groups and whole hospitals  On call and physician scheduling software for group practices, residents, hospitalists and other medical providers for call, clinic, rotation and shift schedules. On[employer] is a hospital-wide system for scheduling doctors and paging doctors on call. EasyPlot is for scientific plotting and data analysis.  [email or web address]  and use [place]'s universal password to access. If you do not have the password, please contact the hospital operator.  3. Locate the [facility] provider you are looking for under [facility] and page to a number that you can be directly reached. 4. If you still have difficulty reaching the provider, please page the [facility] (Director on Call) for the Hospitalists listed on amion for assistance.  05/07/2018, 2:04 AM

## 2018-05-07 NOTE — Progress Notes (Signed)
Pt discharged, COVID-19 procedure complete. Nurse and tech transported out. A/C notified, CN updated, Security called to assist. SRP RN

## 2018-05-07 NOTE — Discharge Summary (Signed)
Physician Discharge Summary  Diane Sexton RCV:[id number] DOB: [birth year] DOA: 05/06/2018  PCP: Patient, No Pcp Per  Admit date: 05/06/2018 Discharge date: 05/07/2018  Time spent: 45 minutes  Recommendations for Outpatient Follow-up:  1. PCP in 2 weeks   Discharge Diagnoses:  Principal Problem:   Acute respiratory disease due to COVID-19 virus Nausea and vomiting   Essential hypertension, benign   Discharge Condition: stable  Diet recommendation: Bland diet, advance as tolerated  Filed Weights   05/06/18 1906  Weight: 77.1 kg    History of present illness:  Diane Sexton is a 48 y.o. female with medical history significant of HTN. - she was having fever, URI symptoms on 3/24 went to outpatient drive-through for cover testing which came back +4 days ago. -Over the last 2 days has been having increased weakness, myalgias, nausea and vomiting and hence presented to the emergency room last night.  Hospital Course:   COVID-19 infection -Patient improving with supportive care only -Chest x-ray noted bibasilar atelectasis, no oxygen requirement at this time -No further nausea or vomiting since last night, was able to tolerate breakfast and clinically feels improved at this time. -Discharged home in a stable condition and advised to self isolate for atleast 7days preferably 14days  -Patient declined hydroxychloroquine or azithromycin and is relatively clinically stable at this time and hence I did not strongly recommend any of the above. -she was given a prescription for orally disintegrating Zofran  Discharge Exam: Vitals:   05/07/18 0541 05/07/18 1103  BP: 111/79 113/71  Pulse: (!) 59 77  Resp: 17 20  Temp: 98.4 F (36.9 C) 98.4 F (36.9 C)  SpO2: 94% 94%    General: AAOx3 Cardiovascular: S1S2/RRR Respiratory:faint ronchi  Discharge Instructions   Discharge Instructions    Diet - low sodium heart healthy   Complete by:  As directed    Increase activity  slowly   Complete by:  As directed      Allergies as of 05/07/2018      Reactions   Oxycodone Other (See Comments)   Upset stomach   Cyclobenzaprine Nausea Only   Hydrocodone-apap-dietary Prod Nausea And Vomiting      Medication List    STOP taking these medications   celecoxib 400 MG capsule Commonly known as:  CeleBREX   diclofenac sodium 1 % Gel Commonly known as:  VOLTAREN   hydrochlorothiazide 12.5 MG tablet Commonly known as:  HYDRODIURIL   methocarbamol 500 MG tablet Commonly known as:  ROBAXIN   traMADol 50 MG tablet Commonly known as:  ULTRAM     TAKE these medications   acetaminophen 325 MG tablet Commonly known as:  TYLENOL Take 2 tablets (650 mg total) by mouth every 6 (six) hours as needed for mild pain or headache (fever >/= 101).   ondansetron 4 MG disintegrating tablet Commonly known as:  Zofran ODT Take 1 tablet (4 mg total) by mouth every 8 (eight) hours as needed for nausea or vomiting.      Allergies  Allergen Reactions  . Oxycodone Other (See Comments)    Upset stomach  . Cyclobenzaprine Nausea Only  . Hydrocodone-Apap-Dietary Prod Nausea And Vomiting      The results of significant diagnostics from this hospitalization (including imaging, microbiology, ancillary and laboratory) are listed below for reference.    Significant Diagnostic Studies: Dg Chest Port 1 View  Result Date: 05/06/2018 CLINICAL DATA:  48 year old female with shortness of breath. EXAM: PORTABLE CHEST 1 VIEW COMPARISON:  Chest radiograph  dated 04/28/2018 FINDINGS: Shallow inspiration with bibasilar atelectasis. Left lung base densities may represent atelectasis although developing infiltrate is not excluded. Clinical correlation is recommended. No pleural effusion or pneumothorax. Stable cardiac silhouette. No acute osseous pathology. IMPRESSION: Shallow inspiration with bibasilar atelectasis. Developing infiltrate at the left lung base is not excluded. Clinical correlation  recommended. Electronically Signed   By: Anner Crete M.D.   On: 05/06/2018 22:32    Microbiology: No results found for this or any previous visit (from the past 240 hour(s)).   Labs: Basic Metabolic Panel: Recent Labs  Lab 05/06/18 1940  NA 135  K 3.1*  CL 96*  CO2 29  GLUCOSE 98  BUN 11  CREATININE 0.76  CALCIUM 8.4*   Liver Function Tests: No results for input(s): AST, ALT, ALKPHOS, BILITOT, PROT, ALBUMIN in the last 168 hours. No results for input(s): LIPASE, AMYLASE in the last 168 hours. No results for input(s): AMMONIA in the last 168 hours. CBC: Recent Labs  Lab 05/06/18 1940  WBC 7.4  NEUTROABS 4.6  HGB 13.6  HCT 41.7  MCV 80.5  PLT 358   Cardiac Enzymes: No results for input(s): CKTOTAL, CKMB, CKMBINDEX, TROPONINI in the last 168 hours. BNP: BNP (last 3 results) No results for input(s): BNP in the last 8760 hours.  ProBNP (last 3 results) No results for input(s): PROBNP in the last 8760 hours.  CBG: No results for input(s): GLUCAP in the last 168 hours.     Signed:  Domenic Polite MD.  [facility] 05/07/2018, 11:44 AM

## 2018-05-07 NOTE — Discharge Instructions (Signed)
Acute Respiratory Distress Syndrome, Adult ° °Acute respiratory distress syndrome is a life-threatening condition in which fluid collects in the lungs. This prevents the lungs from filling with air and passing oxygen into the blood. This can cause the lungs and other vital organs to fail. The condition usually develops following an infection, illness, surgery, or injury. °What are the causes? °This condition may be caused by: °· An infection, such as sepsis or pneumonia. °· A serious injury to the head or chest. °· Severe bleeding from an injury. °· A major surgery. °· Breathing in harmful chemicals or smoke. °· Blood transfusions. °· A blood clot in the lungs. °· Breathing in vomit (aspiration). °· Near-drowning. °· Inflammation of the pancreas (pancreatitis). °· A drug overdose. °What are the signs or symptoms? °Sudden shortness of breath and rapid breathing are the main symptoms of this condition. Other symptoms may include: °· A fast or irregular heartbeat. °· Skin, lips, or fingernails that look blue (cyanosis). °· Confusion. °· Tiredness or loss of energy. °· Chest pain, particularly while taking a breath. °· Coughing. °· Restlessness or anxiety. °· Fever. This is usually present if there is an underlying infection, such as pneumonia. °How is this diagnosed? °This condition is diagnosed based on: °· Your symptoms. °· Medical history. °· A physical exam. During the exam, your health care provider will listen to your heart and check for crackling or wheezing sounds in your lungs. °You may also have other tests to confirm the diagnosis and measure how well your lungs are working. These may include: °· Measuring the amount of oxygen in your blood. Your health care provider will use two methods to do this procedure: °? A small device (pulse oximeter) that is placed on your finger, earlobe, or toe. °? An arterial blood gas test. A sample of blood is taken from an artery and tested for oxygen levels. °· Blood  tests. °· Chest X-rays or CT scans to look for fluid in the lungs. °· Taking a sample of your sputum to test for infection. °· Heart test, such as an echocardiogram or electrocardiogram. This is done to rule out any heart problems (such as heart failure) that may be causing your symptoms. °· Bronchoscopy. During this test, a thin, flexible tube with a light is passed into the mouth or nose, down the windpipe, and into the lungs. °How is this treated? °Treatment depends on the cause of your condition. The goal is to support you while your lungs heal and the underlying cause is treated. Treatment may include: °· Oxygen therapy. This may be done through: °? A tube in your nose or a face mask. °? A ventilator. This device helps move air into and out of your lungs through a breathing tube that is inserted into your mouth or nose. °· Continuous positive airway pressure (CPAP). This treatment uses mild air pressure to keep the airways open. A mask or other device will be placed over your nose or mouth. °· Tracheostomy. During this procedure, a small cut is made in your neck to create an opening to your windpipe. A breathing tube is placed directly into your windpipe. The breathing tube is connected to a ventilator. This is done if you have problems with your airway or if you need a ventilator for a long period of time. °· Positioning you to lie on your stomach (prone position). °· Medicines, such as: °? Sedatives to help you relax. °? Blood pressure medicines. °? Antibiotics to treat infection. °? Blood   thinners to prevent blood clots. °? Diuretics to help prevent excess fluid. °· Fluids and nutrients given through an IV tube. °· Wearing compression stockings on your legs to prevent blood clots. °· Extra corporeal membrane oxygenation (ECMO). This treatment takes blood outside your body, adds oxygen, and removes carbon dioxide. The blood is then returned to your body. This treatment is only used in severe cases. °Follow  these instructions at home: °· Take over-the-counter and prescription medicines only as told by your health care provider. °· Do not use any products that contain nicotine or tobacco, such as cigarettes and e-cigarettes. If you need help quitting, ask your health care provider. °· Limit alcohol intake to no more than 1 drink per day for nonpregnant women and 2 drinks per day for men. One drink equals 12 oz of beer, 5 oz of wine, or 1½ oz of hard liquor. °· Ask friends and family to help you if daily activities make you tired. °· Attend any pulmonary rehabilitation as told by your health care provider. This may include: °? Education about your condition. °? Exercises. °? Breathing training. °? Counseling. °? Learning techniques to conserve energy. °? Nutrition counseling. °· Keep all follow-up visits as told by your health care provider. This is important. °Contact a health care provider if: °· You become short of breath during activity or while resting. °· You develop a cough that does not go away. °· You have a fever. °· Your symptoms do not get better or they get worse. °· You become anxious or depressed. °Get help right away if: °· You have sudden shortness of breath. °· You develop sudden chest pain that does not go away. °· You develop a rapid heart rate. °· You develop swelling or pain in one of your legs. °· You cough up blood. °· You have trouble breathing. °· Your skin, lips, or fingernails turn blue. °These symptoms may represent a serious problem that is an emergency. Do not wait to see if the symptoms will go away. Get medical help right away. Call your local emergency services (911 in the [place] Do not drive yourself to the hospital. °Summary °· Acute respiratory distress syndrome is a life-threatening condition in which fluid collects in the lungs, which leads the lungs and other vital organs to fail. °· This condition usually develops following an infection, illness, surgery, or injury. °· Sudden  shortness of breath and rapid breathing are the main symptoms of acute respiratory distress syndrome. °· Treatment may include oxygen therapy, continuous positive airway pressure (CPAP), tracheostomy, lying on your stomach (prone position), medicines, fluids and nutrients given through an IV tube, compression stockings, and extra corporeal membrane oxygenation (ECMO). °This information is not intended to replace advice given to you by your health care provider. Make sure you discuss any questions you have with your health care provider. °Document Released: 01/18/2005 Document Revised: 01/05/2016 Document Reviewed: 01/05/2016 °Elsevier Interactive Patient Education © 2019 [employer] ° °

## 2018-05-07 NOTE — Progress Notes (Signed)
Pt c/o of nausea after eating breakfast and taking meds. Pt think the nausea could be from the meds than eating. No active emesis/vomiting noted. Pt plan to attempt lunch, will cont to monitor and update. SRP, RN

## 2018-05-07 NOTE — Progress Notes (Signed)
Discharge and COVID-19 instructions reviewed with patients, question addressed, acknowledged understanding. Pt signed GOVPC-34 form, copy left with patient. Pt waiting for family to arrive. SRP, RN

## 2018-07-18 ENCOUNTER — Telehealth: Payer: Self-pay

## 2018-07-18 NOTE — Telephone Encounter (Signed)
Health Coaching 3    Goals-  Patient has been working on increasing her water intake since the her health coaching 2 session. As well as reducing the amount of sodas that she is consuming on a weekly basis. She has currently been drinking about 4-5 8 oz glasses of water daily. I encouraged her to try and increase her water intake to 8 8oz glasses per day. She has quit drinking dark sodas like coke and pepsi. She is currently drinking about 3 ginger ales per week. She has currently not be exercising. The patient is still drinking 4-5 oz glasses of sweet tea per week.   New goal- After talking about the amount of sugar is consuming in the tea and ginger ale I encouraged the patient to try a sugar alternative such as stevia. Patient agreed to making this change. We also talked about how she has noticed more swelling in her feet and ankles since the weather has gotten warmer. I spoke with her about watching her sodium intake in her diet. We talked about decreasing the amount of salt in her diet or choosing a salt alternative for seasoning food. Patient agreed to making this change with her diet.   Barrier to reaching goal-  None   Strategies to overcome- NA   Navigation:  Patient is aware of  a follow up session. Patient is scheduled for her in person follow-up session on August 21, 2018 at 10:00 am.   Time- 16 minutes

## 2018-07-20 ENCOUNTER — Other Ambulatory Visit (HOSPITAL_COMMUNITY): Payer: Self-pay | Admitting: *Deleted

## 2018-07-20 DIAGNOSIS — Z1231 Encounter for screening mammogram for malignant neoplasm of breast: Secondary | ICD-10-CM

## 2018-08-21 ENCOUNTER — Ambulatory Visit: Payer: No Typology Code available for payment source

## 2018-08-23 ENCOUNTER — Ambulatory Visit: Payer: No Typology Code available for payment source

## 2018-12-14 ENCOUNTER — Ambulatory Visit (HOSPITAL_COMMUNITY)
Admission: RE | Admit: 2018-12-14 | Discharge: 2018-12-14 | Disposition: A | Discharge status: Home / Self Care | Payer: No Typology Code available for payment source | Source: Ambulatory Visit | Attending: Obstetrics and Gynecology | Admitting: Obstetrics and Gynecology

## 2018-12-14 ENCOUNTER — Encounter (HOSPITAL_COMMUNITY): Payer: Self-pay

## 2018-12-14 ENCOUNTER — Other Ambulatory Visit: Payer: Self-pay

## 2018-12-14 ENCOUNTER — Ambulatory Visit
Admission: RE | Admit: 2018-12-14 | Discharge: 2018-12-14 | Disposition: A | Discharge status: Home / Self Care | Payer: No Typology Code available for payment source | Source: Ambulatory Visit | Attending: Obstetrics and Gynecology | Admitting: Obstetrics and Gynecology

## 2018-12-14 DIAGNOSIS — Z0000 Encounter for general adult medical examination without abnormal findings: Secondary | ICD-10-CM | POA: Insufficient documentation

## 2018-12-14 DIAGNOSIS — Z1231 Encounter for screening mammogram for malignant neoplasm of breast: Secondary | ICD-10-CM

## 2018-12-14 DIAGNOSIS — Z01419 Encounter for gynecological examination (general) (routine) without abnormal findings: Secondary | ICD-10-CM | POA: Insufficient documentation

## 2018-12-14 NOTE — Patient Instructions (Signed)
Explained breast self awareness with Safiatou L Jeane. Let patient know that her next Pap smear will be due based on the result of today's Pap smear. Referred patient to the [facility] for a screening mammogram. Appointment scheduled for Thursday, December 14, 2018 at 1040. Patient aware of appointment and will be there. Let patient know will follow up with her within the next couple weeks with results of Pap smear by letter or phone. Informed patient that the [facility] will follow-up with her within the next couple of weeks with results of mammogram by letter or phone. Desirai L Withington verbalized understanding.  Matelyn Antonelli, Arvil Chaco, RN 9:52 AM

## 2018-12-14 NOTE — Progress Notes (Addendum)
No complaints today.   Pap Smear: Pap smear completed today. Last Pap smear was 11/29/2017 at [facility] and normal with negative HPV. Patients previous Pap smear 11/23/2016 was normal with negative HPV. Patient has a history of at least six abnormal Pap smears per patient. Patients history of abnormal Pap smears are February 2017 LGSIL with negative HPV, 11/22/2012 was LGSIL with negative HPV, 01/31/2012 LSGIL with negative HPV, 07/26/2011 LGSIL with negative HPV, 03/25/2011 LGSIL, and 10/02/2010 LGSIL. 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 Seven of the last eight Pap smears and colposcopy results are in Epic.  Physical exam: Breasts Breasts symmetrical. No skin abnormalities bilateral breasts. No nipple retraction bilateral breasts. No nipple discharge bilateral breasts. No lymphadenopathy. No lumps palpated bilateral breasts. No complaints of pain or tenderness on exam. Referred patient to the [facility] for a screening mammogram. Appointment scheduled for Thursday, December 14, 2018 at 1040.        Pelvic/Bimanual   Ext Genitalia No lesions, no swelling and no discharge observed on external genitalia.         Vagina Vagina pink and normal texture. No lesions or discharge observed in vagina.          Cervix Cervix is present. Cervix pink and of normal texture. No discharge observed.     Uterus Uterus is present and palpable. Uterus in normal position and enlarged. Patient has a history of uterine fibroids.        Adnexae Bilateral ovaries present and palpable. No tenderness on palpation.         Rectovaginal No rectal exam completed today since patient had no rectal complaints. No skin abnormalities observed on exam.    Smoking History: Patient has never smoked.  Patient Navigation: Patient education provided. Access to services provided for patient through [facility] program.   Colorectal Cancer Screening: Per patient has  never had a colonoscopy completed. No complaints today.   Breast and Cervical Cancer Risk Assessment: Patient has no family history of breast cancer, known genetic mutations, or radiation treatment to the chest before age 76. Patient has a history of cervical dysplasia. Patient has no history of being immunocompromised or DES exposure in-utero.  Risk Assessment    Risk Scores      12/14/2018 11/29/2017   Last edited by: Loletta Parish, RN Armond Hang, LPN   5-year risk: 0.9 % 0.8 %   Lifetime risk: 6.7 % 6.8 %

## 2018-12-14 NOTE — Addendum Note (Signed)
Encounter addended by: Loletta Parish, RN on: 12/14/2018 11:41 AM  Actions taken: Clinical Note Signed

## 2018-12-18 LAB — CYTOLOGY - PAP
Comment: NEGATIVE
Diagnosis: NEGATIVE
High risk HPV: NEGATIVE

## 2018-12-22 ENCOUNTER — Telehealth (HOSPITAL_COMMUNITY): Payer: Self-pay | Admitting: *Deleted

## 2018-12-22 NOTE — Telephone Encounter (Signed)
Patient called and left voicemail requesting Pap smear results. Called patient back let her know that her Pap smear was normal and HPV negative. Patient has had three normal Pap smears with negative HPV since last abnormal that include (11/23/2016, 11/29/2017, and 12/14/2018). Patient advised that next Pap smear will be due in 3 years. Patient complained of vaginal burning and itching since Pap smear on 12/14/18. Offered to refer patient to the [facility] for follow-up. Informed patient that BCCCP would not cover visit and that she can complete the [facility] application. Patient stated she is working on applying for the orange card and declined referral today.  Also, suggested to patient that she can go to the [facility] Department or try OTC Monistat. Patient advised to call if would like a referral or symptoms do not improve. Patient verbalized understanding.

## 2019-09-30 IMAGING — CR DG CHEST 2V
2 series · 2 of 2 positions shown · non-contrast
Comparison: 06/09/2011

CLINICAL DATA: Cough and chest congestion over the last 6 weeks.

EXAM:
CHEST  2 VIEW

[w chest pa]
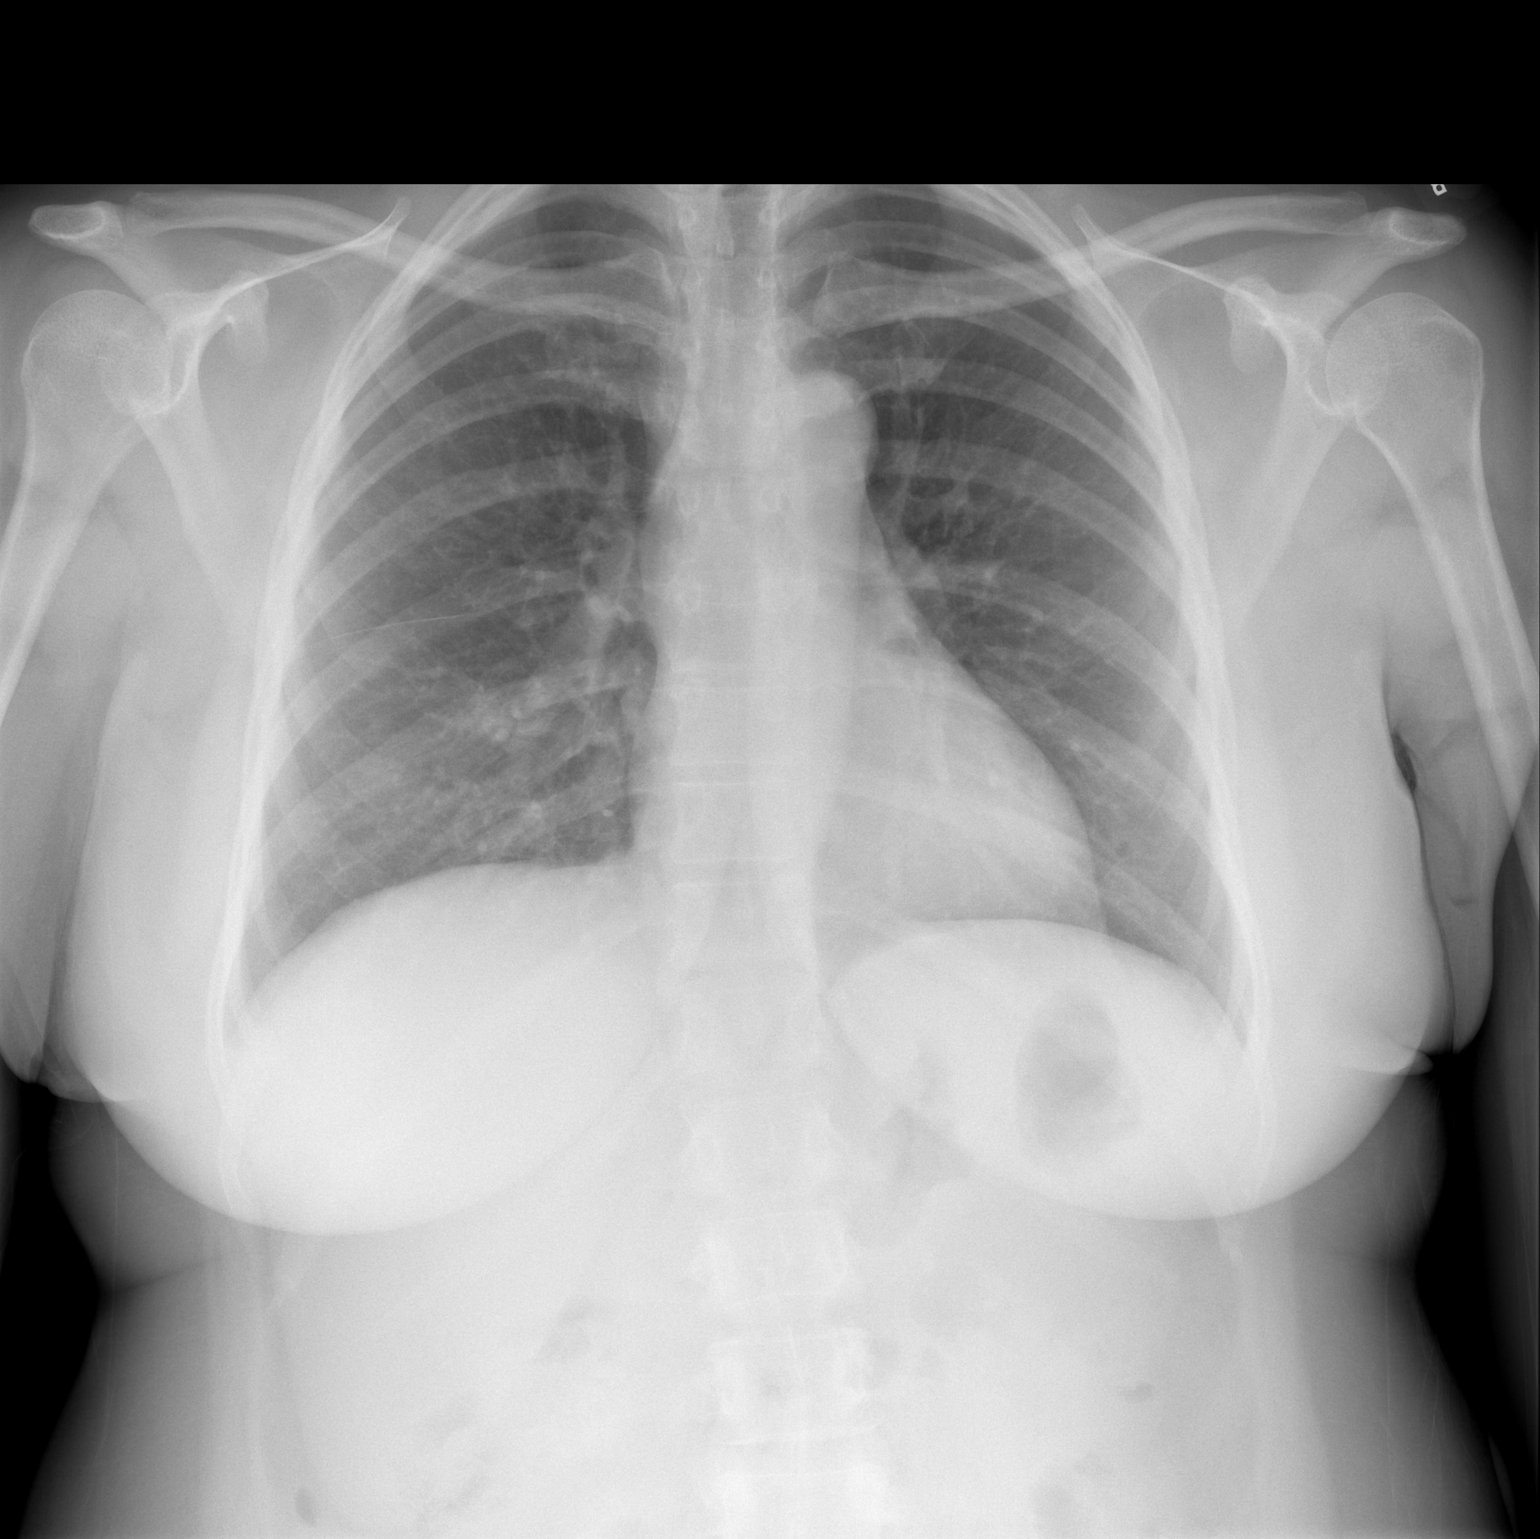

[w chest lat]
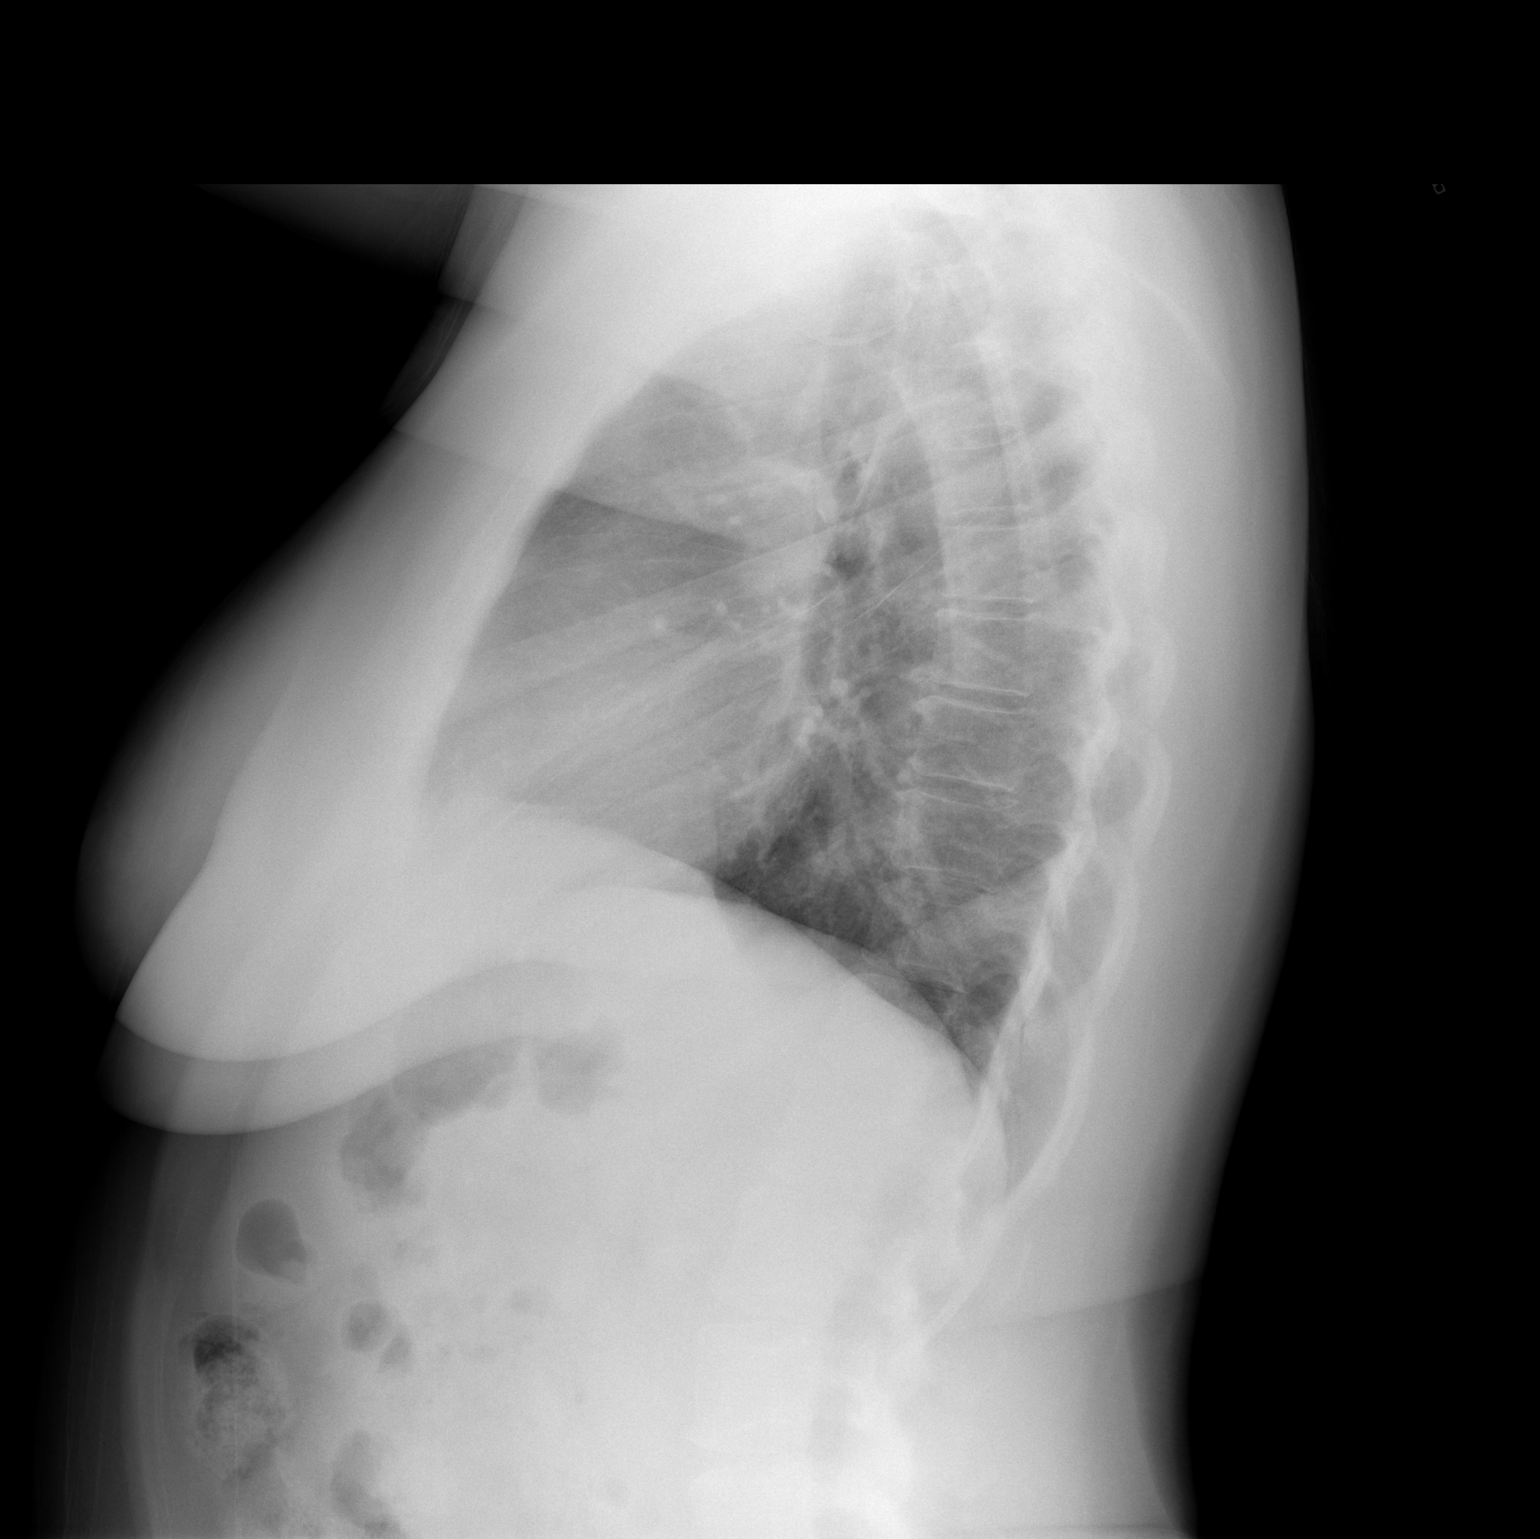

[2 of 2 positions shown; findings below may reference images not displayed]

FINDINGS: Heart size is normal. Mediastinal shadows are normal. The lungs are
clear. No bronchial thickening. No infiltrate, mass, effusion or
collapse. Pulmonary vascularity is normal. No bony abnormality.
IMPRESSION: Normal

## 2019-11-14 ENCOUNTER — Telehealth: Payer: Self-pay

## 2019-11-14 NOTE — Telephone Encounter (Signed)
Telephoned patient at home number. Left a voice message with BCCCP contact information. 

## 2019-11-15 ENCOUNTER — Telehealth: Payer: Self-pay | Admitting: *Deleted

## 2019-11-15 NOTE — Telephone Encounter (Addendum)
Patient called Diane Sexton and complained of right breast pain. Patient is a current BCCCP patient. After reaccessing patients eligibility for continued services with BCCCP she is no longer eligible due to she started a new job and her income exceeds eligibility. Called the stated to verify if patient could still be seen. Per the [facility] patient is not eligible for BCCCP due to income exceeds eligibility.   Called patient patient and explained the BCCCP guidelines and that she is not currently eligible for BCCCP. Explained the [employer] program and that it is a Therapist, music funded. Explained the guidelines for eligibility. Told her about the options for mammograms at the [facility]. Patient denied a lump, breast discharge, or redness. Discussed possible causes of breast pain that include caffeine intake and wearing a bra that doesn't fit properly. Answered all of patients questions and verbalized understanding.

## 2019-11-30 ENCOUNTER — Telehealth: Payer: Self-pay | Admitting: *Deleted

## 2019-11-30 NOTE — Telephone Encounter (Signed)
Patient called and left voicemail stating she would like to renew her BCCCP. Attempted to call patient back. No one answered the phone. Left voicemail for patient to call me back.

## 2019-12-03 ENCOUNTER — Other Ambulatory Visit: Payer: Self-pay | Admitting: *Deleted

## 2019-12-03 ENCOUNTER — Telehealth: Payer: Self-pay | Admitting: *Deleted

## 2019-12-03 DIAGNOSIS — Z1231 Encounter for screening mammogram for malignant neoplasm of breast: Secondary | ICD-10-CM

## 2019-12-03 NOTE — Telephone Encounter (Signed)
Patient returned my call and left voicemail. Attempted to call patient back. No one answered phone. Left voicemail for patient to call me back.

## 2019-12-03 NOTE — Telephone Encounter (Signed)
Patient returned my phone call and left voicemail. Attempted to call patient back and no one answered. Left voicemail for patient to call me back.

## 2020-01-08 ENCOUNTER — Ambulatory Visit: Payer: No Typology Code available for payment source

## 2020-02-07 ENCOUNTER — Other Ambulatory Visit: Payer: Self-pay

## 2020-02-07 ENCOUNTER — Ambulatory Visit: Payer: Self-pay | Admitting: *Deleted

## 2020-02-07 ENCOUNTER — Ambulatory Visit
Admission: RE | Admit: 2020-02-07 | Discharge: 2020-02-07 | Disposition: A | Discharge status: Home / Self Care | Payer: No Typology Code available for payment source | Source: Ambulatory Visit | Attending: Obstetrics and Gynecology | Admitting: Obstetrics and Gynecology

## 2020-02-07 VITALS — BP 142/88 | Wt 194.2 lb

## 2020-02-07 DIAGNOSIS — Z1231 Encounter for screening mammogram for malignant neoplasm of breast: Secondary | ICD-10-CM

## 2020-02-07 DIAGNOSIS — Z1239 Encounter for other screening for malignant neoplasm of breast: Secondary | ICD-10-CM

## 2020-02-07 NOTE — Progress Notes (Signed)
Diane Sexton is a 50 y.o. female who presents to [facility] clinic today with no complaints today. Patient did state she had some right breast pain in October 2021 that last 10 days and resolved.    Pap Smear: Pap smear not completed today. Last Pap smear was11/01/2019 at [facility] clinic and normal with negative HPV. Patients previous Pap smears 11/29/2017 was normal with negative HPV and 11/23/2016 was normal with negative HPV. Patient has a history of at least sixabnormal Pap smears per patient. Patients history of abnormalPap smears are February 2017 LGSIL with negative HPV,11/22/2012 was LGSIL withnegative HPV, 01/31/2012 LSGIL with negative HPV, 07/26/2011 LGSIL with negative HPV, 03/25/2011 LGSIL, and 10/02/2010 LGSIL. Patient has a history of one colposcopy1/20/2011for follow-up that was CIN-I.Eight of the last nine Pap smears and colposcopy results are in Epic.   Physical exam: Breasts Breasts symmetrical. No skin abnormalities bilateral breasts. No nipple retraction bilateral breasts. No nipple discharge bilateral breasts. No lymphadenopathy. No lumps palpated bilateral breasts. No complaints of pain or tenderness on exam.       Pelvic/Bimanual Pap is not indicated today per BCCCP guidelines.    Smoking History: Patient has never smoked.   Patient Navigation: Patient education provided. Access to services provided for patient through BCCCP program.  Colorectal Cancer Screening: Per patient has never had colonoscopy completed. No complaints today.    Breast and Cervical Cancer Risk Assessment: Patient has no family history of breast cancer, known genetic mutations, or radiation treatment to the chest before age 34. Patient hasahistory of cervical dysplasia. Patient has no history of beingimmunocompromised or DES exposure in-utero.  Risk Assessment    Risk Scores      02/07/2020 12/14/2018   Last edited by: Priscille Heidelberg, RN Narcissa Melder, Carlye Grippe, RN   5-year risk:  0.9 % 0.9 %   Lifetime risk: 6.6 % 6.7 %         A: BCCCP exam without pap smear No complaints.  P: [facility] to the [facility] for a screening mammogram on the mobile unit. Appointment scheduled Thursday, February 07, 2020 at 1140.  Priscille Heidelberg, RN 02/07/2020 11:18 AM

## 2020-02-07 NOTE — Patient Instructions (Signed)
Explained breast self awareness with Diane Sexton. Patient did not need a Pap smear today due to last Pap smear and HPV typing was 12/14/2018. Let her know BCCCP will cover Pap smears and HPV typing every 5 years unless has a history of abnormal Pap smears. Referred patient to the [facility] for a screening mammogram on the mobile unit. Appointment scheduled Thursday, February 07, 2020 at 1140. Patient escorted to the mobile unit following her BCCCP appointment for her screening mammogram. Let patient know the Breast Center will follow up with her within the next couple weeks with results of her mammogram by letter or phone. Diane Sexton verbalized understanding.  Diane Sexton, Diane Maser, RN 11:19 AM

## 2020-03-03 ENCOUNTER — Encounter: Payer: No Typology Code available for payment source | Admitting: Obstetrics and Gynecology

## 2020-03-05 ENCOUNTER — Ambulatory Visit (INDEPENDENT_AMBULATORY_CARE_PROVIDER_SITE_OTHER): Payer: Self-pay | Admitting: Obstetrics & Gynecology

## 2020-03-05 ENCOUNTER — Encounter: Payer: Self-pay | Admitting: Obstetrics & Gynecology

## 2020-03-05 ENCOUNTER — Other Ambulatory Visit: Payer: Self-pay

## 2020-03-05 VITALS — BP 146/84 | HR 93 | Ht 65.5 in | Wt 192.6 lb

## 2020-03-05 DIAGNOSIS — D251 Intramural leiomyoma of uterus: Secondary | ICD-10-CM

## 2020-03-05 DIAGNOSIS — Z8742 Personal history of other diseases of the female genital tract: Secondary | ICD-10-CM | POA: Insufficient documentation

## 2020-03-05 DIAGNOSIS — Z6831 Body mass index (BMI) 31.0-31.9, adult: Secondary | ICD-10-CM

## 2020-03-05 DIAGNOSIS — I10 Essential (primary) hypertension: Secondary | ICD-10-CM

## 2020-03-05 NOTE — Progress Notes (Signed)
States referred here for fibroids. Had seen Dr.Dove in 2013. States having pain in abdomen/ bloating/ constipation. Tevis Dunavan,RN

## 2020-03-05 NOTE — Progress Notes (Signed)
GYNECOLOGY  VISIT  CC:   Discussion of fibroids  HPI: 50 y.o. G31P0030 Single Black or African American female here for discussion of treatment for fibroids.    H/o known fibroids.  Last ultrasound was done 07/29/2011.  Uterus was about 9.8 x 4.7 x 7.7cm and there were multiple fibroids.  Largest fibroid was 6.0cm.  Cycles were regular and heavy with lots of clots.  Flow lasted about 3 days.  She thinks about 2 years ago, her cycles started to change.  Flow was less regular and she did skip a cycle from time to time.  Now her cycles are less frequent.  She did have a uterine artery embolization in 2008 in [place].  Pt does have pain with intercourse--deep penetration.  Last episode of intercourse was in the summer.  Not currently dating anyone or SA.  Does have some bladder pressure sensation.  Also feels like she is bloated and this is related to fibroids.    Last pap was 12/2018 and was normal with neg HR HPV.    GYNECOLOGIC HISTORY: No LMP recorded. Patient is perimenopausal. Contraception: abstinence   Patient Active Problem List   Diagnosis Date Noted  . Well woman exam with routine gynecological exam 12/14/2018  . Acute respiratory disease due to COVID-19 virus 05/06/2018  . Bacterial vaginitis 08/25/2012  . Essential hypertension, benign 05/11/2012  . Overweight (BMI 25.0-29.9) 05/11/2012  . Seborrheic keratosis 05/11/2012  . Metatarsalgia of left foot 12/15/2011  . LGSIL (low grade squamous intraepithelial dysplasia) 07/26/2011  . Bipolar II disorder ([place]) 05/11/2011    Class: Chronic  . Obesity 04/05/2011  . Chest pain, atypical 11/18/2010  . Fibroids 09/18/2010  . Elevated TSH 05/22/2010  . PMDD 12/24/2009  . HYPERLIPIDEMIA 06/09/2009  . DEGENERATIVE JOINT DISEASE, SHOULDER 04/28/2009  . BACK PAIN 04/28/2009    Past Medical History:  Diagnosis Date  . Constipation   . Depression    bi-polar  . DUB (dysfunctional uterine bleeding)   . Fibroids   . Hyperlipemia    . Hypertension   . PMDD (premenstrual dysphoric disorder)     Past Surgical History:  Procedure Laterality Date  . APPENDECTOMY  2009  . [place]  2008    MEDS:   Current Outpatient Medications on File Prior to Visit  Medication Sig Dispense Refill  . acetaminophen (TYLENOL) 325 MG tablet Take 2 tablets (650 mg total) by mouth every 6 (six) hours as needed for mild pain or headache (fever >/= 101).    Marland Kitchen amLODipine (NORVASC) 2.5 MG tablet Take 2.5 mg by mouth daily.    Marland Kitchen atorvastatin (LIPITOR) 10 MG tablet Take 10 mg by mouth daily.    Marland Kitchen losartan (COZAAR) 25 MG tablet Take 25 mg by mouth daily.    . ondansetron (ZOFRAN ODT) 4 MG disintegrating tablet Take 1 tablet (4 mg total) by mouth every 8 (eight) hours as needed for nausea or vomiting. 20 tablet 0   No current facility-administered medications on file prior to visit.    ALLERGIES: Oxycodone, Cyclobenzaprine, and Hydrocodone-apap-dietary prod  Family History  Problem Relation Age of Onset  . Hypertension Mother   . Neuropathy Mother   . Hypertension Father   . Diabetes Father   . Diabetes Mellitus II Father   . Diabetes Mellitus II Paternal Aunt   . Diabetes Mellitus II Paternal Aunt   . Diabetes Mellitus II Paternal Uncle   . Dementia Maternal Grandmother   . Heart attack Maternal Grandmother 48  . Diabetes  Mellitus II Paternal Uncle     SH:  Single, non smoker  Review of Systems  Constitutional: Negative.   Gastrointestinal:       Bloating  Genitourinary: Positive for menstrual problem.       Bladder pressure  Hematological: Negative.   Psychiatric/Behavioral: Negative.     PHYSICAL EXAMINATION:    BP (!) 146/84   Pulse 93   Ht 5' 5.5" (1.664 m)   Wt 192 lb 9.6 oz (87.4 kg)   BMI 31.56 kg/m     General appearance: alert, cooperative and appears stated age Abdomen: soft, non-tender; bowel sounds normal; no masses,  no organomegaly Lymph:  no inguinal LAD noted  Pelvic: External genitalia:  no lesions               Urethra:  normal appearing urethra with no masses, tenderness or lesions              Bartholins and Skenes: normal                 Vagina: normal appearing vagina with normal color and discharge, no lesions              Cervix: no lesions              Bimanual Exam:  Uterus:  enlarged, 12-14 weeks, fuller on right and anteriorly, mobile, globular c/w fibroids weeks size              Adnexa: no mass, fullness, tenderness  Chaperone, Geralyn Flash, RN, was present for exam.  Assessment/Plan:  1. Intramural uterine fibroid - last PUS was in 2013.  Uterus does feel larger on exam than measurements from this ultrasound.  Would recommend PUS to recheck and be able to compare.  She is having bladder pressure and painful intercourse likely from the fibroids.  However, her cycles are changing and she is likely close to menopause.  Roy recommended as well to see how close to menopause.  D/w pt her fibroids will shrink in size with menopause and, therefore, symptoms will improve.  I feel that a little bit of time may be the solution to much of her concerns with her fibroids.  She is applying for financial assistance and wants to wait for any testing until she has this information back.  She is also aware that what she describes as bloating is just adipose tissue and that hysterectomy will not fix this.  Pt voiced clear understanding of above.  2. Essential hypertension, benign  3. BMI 31.0-31.9,adult   33 minutes of total time was spent for this patient encounter, including preparation, face-to-face counseling with the patient and coordination of care, and documentation of the encounter.

## 2020-04-17 ENCOUNTER — Encounter (HOSPITAL_BASED_OUTPATIENT_CLINIC_OR_DEPARTMENT_OTHER): Payer: Self-pay

## 2020-04-17 ENCOUNTER — Emergency Department (HOSPITAL_BASED_OUTPATIENT_CLINIC_OR_DEPARTMENT_OTHER)
Admission: EM | Admit: 2020-04-17 | Discharge: 2020-04-17 | Disposition: A | Discharge status: Home / Self Care | Payer: No Typology Code available for payment source | Attending: Emergency Medicine | Admitting: Emergency Medicine

## 2020-04-17 ENCOUNTER — Other Ambulatory Visit: Payer: Self-pay

## 2020-04-17 DIAGNOSIS — I10 Essential (primary) hypertension: Secondary | ICD-10-CM

## 2020-04-17 DIAGNOSIS — J029 Acute pharyngitis, unspecified: Secondary | ICD-10-CM

## 2020-04-17 DIAGNOSIS — Z79899 Other long term (current) drug therapy: Secondary | ICD-10-CM | POA: Insufficient documentation

## 2020-04-17 MED ORDER — AMLODIPINE BESYLATE 2.5 MG PO TABS: 2.5000 mg | ORAL_TABLET | Freq: Every day | ORAL | 0 refills | Status: DC

## 2020-04-17 MED ORDER — ATORVASTATIN CALCIUM 10 MG PO TABS: 10.0000 mg | ORAL_TABLET | Freq: Every day | ORAL | 0 refills | Status: DC

## 2020-04-17 MED ORDER — AMOXICILLIN 500 MG PO CAPS
500.0000 mg | ORAL_CAPSULE | Freq: Three times a day (TID) | ORAL | 0 refills | Status: DC
Start: 2020-04-17 — End: 2022-02-25

## 2020-04-17 NOTE — ED Triage Notes (Signed)
Pt c/o worsening throat pain x 2 days with associated bilateral ear pain ("L ear is clogged").  PT also c/o dizziness this am.  Denies syncopal episode, SOB/CP.  Reports recent sick contact.

## 2020-04-17 NOTE — ED Provider Notes (Signed)
[facility] EMERGENCY DEPARTMENT Provider Note   CSN: [id number] Arrival date & time: 04/17/20  0725     History Chief Complaint  Patient presents with  . Sore Throat    Diane Sexton is a 50 y.o. female.  She is complaining of sore throat for the last 2 days.  Moderate pain.  Also blocked ears.  No fevers chills cough shortness of breath.  Is Covid vaccinated and boosted.  The history is provided by the patient.  Sore Throat This is a new problem. The current episode started 2 days ago. The problem occurs constantly. The problem has not changed since onset.Pertinent negatives include no chest pain, no abdominal pain, no headaches and no shortness of breath. The symptoms are aggravated by swallowing. Nothing relieves the symptoms. She has tried nothing for the symptoms. The treatment provided no relief.       Past Medical History:  Diagnosis Date  . Constipation   . Depression    bi-polar  . DUB (dysfunctional uterine bleeding)   . Fibroids   . History of trichomoniasis 2018  . Hyperlipemia   . Hypertension   . PMDD (premenstrual dysphoric disorder)     Patient Active Problem List   Diagnosis Date Noted  . History of abnormal cervical Pap smear 03/05/2020  . Well woman exam with routine gynecological exam 12/14/2018  . Bacterial vaginitis 08/25/2012  . Essential hypertension, benign 05/11/2012  . Overweight (BMI 25.0-29.9) 05/11/2012  . Seborrheic keratosis 05/11/2012  . Metatarsalgia of left foot 12/15/2011  . Bipolar II disorder ([place]) 05/11/2011    Class: Chronic  . Obesity 04/05/2011  . Chest pain, atypical 11/18/2010  . Fibroids 09/18/2010  . Elevated TSH 05/22/2010  . PMDD 12/24/2009  . HYPERLIPIDEMIA 06/09/2009  . DEGENERATIVE JOINT DISEASE, SHOULDER 04/28/2009  . BACK PAIN 04/28/2009    Past Surgical History:  Procedure Laterality Date  . APPENDECTOMY  2009  . [place]  2008     OB History    Gravida  3   Para      Term       Preterm      AB  3   Living  0     SAB      IAB  3   Ectopic      Multiple      Live Births              Family History  Problem Relation Age of Onset  . Hypertension Mother   . Neuropathy Mother   . Hypertension Father   . Diabetes Father   . Diabetes Mellitus II Father   . Diabetes Mellitus II Paternal Aunt   . Diabetes Mellitus II Paternal Aunt   . Diabetes Mellitus II Paternal Uncle   . Dementia Maternal Grandmother   . Heart attack Maternal Grandmother 48  . Diabetes Mellitus II Paternal Uncle     Social History   Tobacco Use  . Smoking status: Never Smoker  . Smokeless tobacco: Never Used  Vaping Use  . Vaping Use: Never used  Substance Use Topics  . Alcohol use: Yes    Alcohol/week: 1.0 standard drink    Types: 1 Standard drinks or equivalent per week    Comment: occasionally  . Drug use: No    Home Medications Prior to Admission medications   Medication Sig Start Date End Date Taking? Authorizing Provider  amLODipine (NORVASC) 2.5 MG tablet Take 2.5 mg by mouth daily. 08/16/19  Yes [provider]  atorvastatin (LIPITOR) 10 MG tablet Take 10 mg by mouth daily. 08/16/19  Yes [provider]    Allergies    Oxycodone, Cyclobenzaprine, and Hydrocodone-apap-dietary prod  Review of Systems   Review of Systems  Constitutional: Negative for fever.  HENT: Positive for ear pain and sore throat.   Respiratory: Negative for shortness of breath.   Cardiovascular: Negative for chest pain.  Gastrointestinal: Negative for abdominal pain.  Musculoskeletal: Negative for neck pain.  Neurological: Negative for headaches.    Physical Exam Updated Vital Signs BP (!) 145/95 (BP Location: Right Arm)   Pulse 86   Temp 98.1 F (36.7 C)   Resp 18   Ht 5\' 5"  (1.651 m)   Wt 88 kg   SpO2 98%   BMI 32.28 kg/m   Physical Exam Constitutional:      Appearance: She is well-developed.  HENT:     Head: Normocephalic and atraumatic.      Right Ear: Tympanic membrane normal. Tympanic membrane is not erythematous.     Left Ear: Tympanic membrane normal. Tympanic membrane is not erythematous.     Mouth/Throat:     Mouth: Mucous membranes are moist.     Pharynx: Uvula midline. Oropharyngeal exudate and posterior oropharyngeal erythema present.     Tonsils: Tonsillar exudate present. No tonsillar abscesses.  Eyes:     Conjunctiva/sclera: Conjunctivae normal.  Cardiovascular:     Rate and Rhythm: Normal rate and regular rhythm.  Pulmonary:     Effort: Pulmonary effort is normal.     Breath sounds: Normal breath sounds.  Musculoskeletal:     Cervical back: Neck supple.  Skin:    General: Skin is warm and dry.  Neurological:     General: No focal deficit present.     Mental Status: She is alert.     GCS: GCS eye subscore is 4. GCS verbal subscore is 5. GCS motor subscore is 6.     ED Results / Procedures / Treatments   Labs (all labs ordered are listed, but only abnormal results are displayed) Labs Reviewed - No data to display  EKG None  Radiology No results found.  Procedures Procedures   Medications Ordered in ED Medications - No data to display  ED Course  I have reviewed the triage vital signs and the nursing notes.  Pertinent labs & imaging results that were available during my care of the patient were reviewed by me and considered in my medical decision making (see chart for details).  Clinical Course as of 04/17/20 2024  Thu Apr 17, 2020  0827 Nurse informed of the patient's been out of her blood pressure medicines for a few weeks and was hoping I could refill them.  Sent prescription for amlodipine and Lipitor to pharmacy. [MB]    Clinical Course User Index [MB] Hayden Rasmussen, MD   MDM Rules/Calculators/A&P                         Yoona L Kram was evaluated in Emergency Department on 04/17/2020 for the symptoms described in the history of present illness. She was evaluated in the  context of the global COVID-19 pandemic, which necessitated consideration that the patient might be at risk for infection with the SARS-CoV-2 virus that causes COVID-19. Institutional protocols and algorithms that pertain to the evaluation of patients at risk for COVID-19 are in a state of rapid change based on information released by regulatory bodies  including the CDC and federal and state organizations. These policies and algorithms were followed during the patient's care in the ED.  Differential diagnosis includes strep, viral syndrome, Covid, peritonsillar abscess.  Clinically has strep throat.  Tested negative few days ago for same.  She declines Covid testing.  Will cover with antibiotics.  Patient's blood pressure is noted to be high here.  She has been out of her medications and so these were refilled.  Recommended close follow-up with PCP. Final Clinical Impression(s) / ED Diagnoses Final diagnoses:  Acute pharyngitis, unspecified etiology  Primary hypertension    Rx / DC Orders ED Discharge Orders         Ordered    amoxicillin (AMOXIL) 500 MG capsule  3 times daily        04/17/20 0748    amLODipine (NORVASC) 2.5 MG tablet  Daily        04/17/20 0826    atorvastatin (LIPITOR) 10 MG tablet  Daily        04/17/20 0826           Hayden Rasmussen, MD 04/17/20 2025

## 2020-04-17 NOTE — Discharge Instructions (Addendum)
You were seen in the emergency department for sore throat earache.  You had signs of infection on exam.  We are treating you with antibiotics.  You declined a Covid test.  Please use ibuprofen 3 times a day with food on your stomach.  Warm salt water gargles.  Follow-up with your doctor.  Return to emergency department for any worsening or concerning symptoms

## 2020-08-29 LAB — HM COLONOSCOPY
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
## 2020-11-08 IMAGING — DX PORTABLE CHEST - 1 VIEW
1 series · 1 of 1 positions shown · non-contrast
Comparison: Chest radiograph dated 04/28/2018

CLINICAL DATA: 48-year-old female with shortness of breath.

EXAM:
PORTABLE CHEST 1 VIEW

[chest ap]
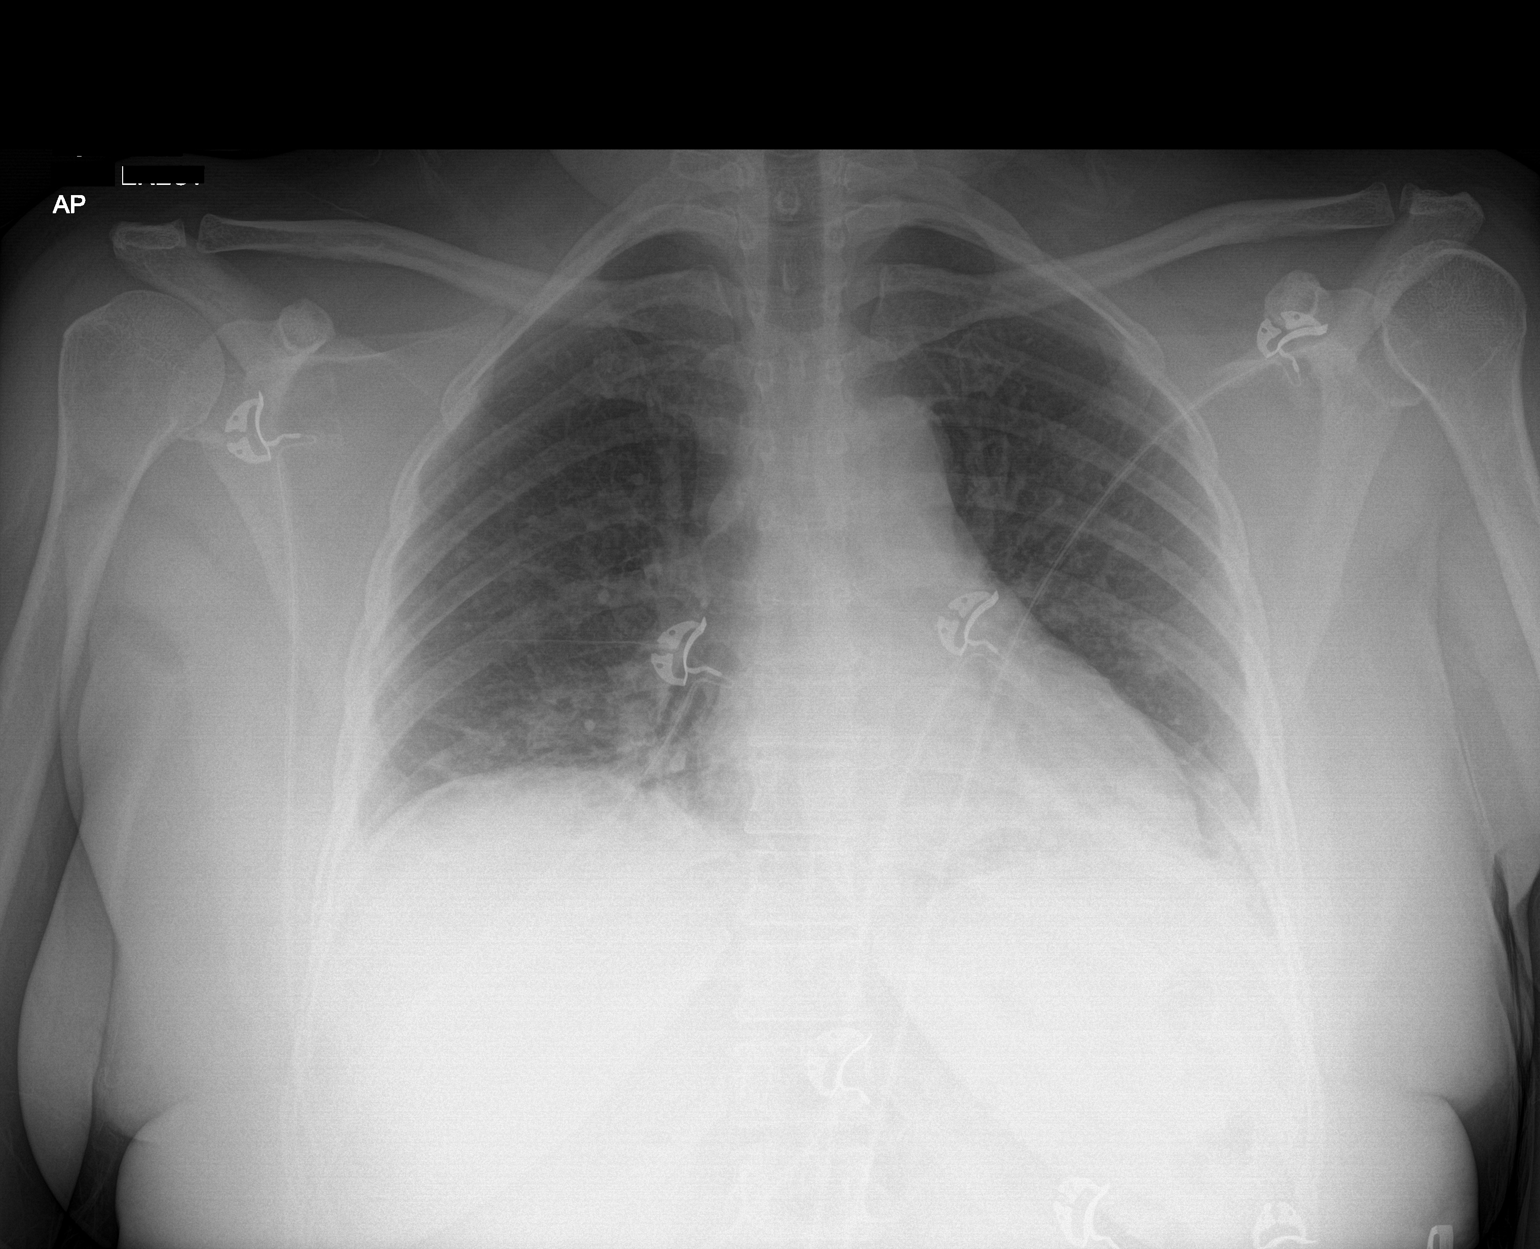

[1 of 1 positions shown; findings below may reference images not displayed]

FINDINGS: Shallow inspiration with bibasilar atelectasis. Left lung base
densities may represent atelectasis although developing infiltrate
is not excluded. Clinical correlation is recommended. No pleural
effusion or pneumothorax. Stable cardiac silhouette. No acute
osseous pathology.
IMPRESSION: Shallow inspiration with bibasilar atelectasis. Developing
infiltrate at the left lung base is not excluded. Clinical
correlation recommended.

## 2020-11-18 ENCOUNTER — Other Ambulatory Visit: Payer: Self-pay | Admitting: Obstetrics and Gynecology

## 2020-11-18 DIAGNOSIS — Z1231 Encounter for screening mammogram for malignant neoplasm of breast: Secondary | ICD-10-CM

## 2021-02-11 ENCOUNTER — Other Ambulatory Visit: Payer: Self-pay

## 2021-02-11 DIAGNOSIS — Z1231 Encounter for screening mammogram for malignant neoplasm of breast: Secondary | ICD-10-CM

## 2021-02-12 ENCOUNTER — Ambulatory Visit: Payer: Self-pay

## 2021-02-19 ENCOUNTER — Ambulatory Visit
Admission: RE | Admit: 2021-02-19 | Discharge: 2021-02-19 | Disposition: A | Discharge status: Home / Self Care | Payer: No Typology Code available for payment source | Source: Ambulatory Visit | Attending: Nurse Practitioner | Admitting: Nurse Practitioner

## 2021-02-19 DIAGNOSIS — Z1231 Encounter for screening mammogram for malignant neoplasm of breast: Secondary | ICD-10-CM

## 2021-02-23 ENCOUNTER — Other Ambulatory Visit: Payer: Self-pay

## 2021-02-23 DIAGNOSIS — N6489 Other specified disorders of breast: Secondary | ICD-10-CM

## 2021-03-03 ENCOUNTER — Ambulatory Visit: Payer: Self-pay

## 2021-03-03 ENCOUNTER — Other Ambulatory Visit: Payer: Self-pay

## 2021-03-03 ENCOUNTER — Ambulatory Visit: Payer: Self-pay | Admitting: *Deleted

## 2021-03-03 VITALS — BP 144/90 | Wt 187.5 lb

## 2021-03-03 DIAGNOSIS — Z1239 Encounter for other screening for malignant neoplasm of breast: Secondary | ICD-10-CM

## 2021-03-03 DIAGNOSIS — Z1211 Encounter for screening for malignant neoplasm of colon: Secondary | ICD-10-CM

## 2021-03-03 NOTE — Patient Instructions (Signed)
Explained breast self awareness with Diane Sexton. Patient did not need a Pap smear today due to last Pap smear was 12/14/2018. Let her know that based on her history of abnormal Pap smears that her next Pap smear is recommended in 3 years. Referred patient to the [facility] for a left breast diagnostic mammogram per recommendation. Appointment scheduled Thursday, March 12, 2021 at 1520. Patient aware of appointment and will be there. Diane Sexton verbalized understanding.  Diane Sexton, Arvil Chaco, RN 2:32 PM

## 2021-03-03 NOTE — Progress Notes (Signed)
Diane Sexton is a 51 y.o. female who presents to [facility] clinic today with no complaints. Patient referred to [facility] by the [facility] due to having a screening mammogram completed 02/19/2021 that additional imaging of the left breast is recommended for follow up.   Pap Smear: Pap smear not completed today. Last Pap smear was 12/14/2018 at [facility] clinic and normal with negative HPV. Patients previous Pap smears 11/29/2017 was normal with negative HPV and 11/23/2016 was normal with negative HPV. Patient has a history of at least six abnormal Pap smears per patient. Patients history of abnormal Pap smears are February 2017 LGSIL with negative HPV, 11/22/2012 was LGSIL with negative HPV, 01/31/2012 LSGIL with negative HPV, 07/26/2011 LGSIL with negative HPV, 03/25/2011 LGSIL, and 10/02/2010 LGSIL. 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 Eight of the last nine Pap smears and colposcopy results are in Epic.   Physical exam: Breasts Breasts symmetrical. No skin abnormalities bilateral breasts. No nipple retraction bilateral breasts. No nipple discharge bilateral breasts. No lymphadenopathy. No lumps palpated bilateral breasts. No complaints of pain or tenderness on exam.     MS DIGITAL SCREENING BILATERAL  Result Date: 11/05/2016 CLINICAL DATA:  Screening. EXAM: DIGITAL SCREENING BILATERAL MAMMOGRAM WITH CAD COMPARISON:  Previous exam(s). ACR Breast Density Category c: The breast tissue is heterogeneously dense, which may obscure small masses. FINDINGS: There are no findings suspicious for malignancy. Images were processed with CAD. IMPRESSION: No mammographic evidence of malignancy. A result letter of this screening mammogram will be mailed directly to the patient. RECOMMENDATION: Screening mammogram in one year. (Code:SM-B-01Y) BI-RADS CATEGORY  1: Negative. Electronically Signed   By: Lajean Manes M.D.   On: 11/05/2016 14:20   MS DIGITAL SCREENING  TOMO BILATERAL  Result Date: 02/20/2021 CLINICAL DATA:  Screening. EXAM: DIGITAL SCREENING BILATERAL MAMMOGRAM WITH TOMOSYNTHESIS AND CAD TECHNIQUE: Bilateral screening digital craniocaudal and mediolateral oblique mammograms were obtained. Bilateral screening digital breast tomosynthesis was performed. The images were evaluated with computer-aided detection. COMPARISON:  Previous exam(s). ACR Breast Density Category c: The breast tissue is heterogeneously dense, which may obscure small masses. FINDINGS: In the left breast, possible asymmetries warrant further evaluation. In the right breast, no findings suspicious for malignancy. IMPRESSION: Further evaluation is suggested for possible asymmetries in the left breast. RECOMMENDATION: Diagnostic mammogram and possibly ultrasound of the left breast. (Code:FI-L-17M) The patient will be contacted regarding the findings, and additional imaging will be scheduled. BI-RADS CATEGORY  0: Incomplete. Need additional imaging evaluation and/or prior mammograms for comparison. Electronically Signed   By: Kristopher Oppenheim M.D.   On: 02/20/2021 13:39   MS DIGITAL SCREENING TOMO BILATERAL  Result Date: 02/08/2020 CLINICAL DATA:  Screening. EXAM: DIGITAL SCREENING BILATERAL MAMMOGRAM WITH TOMO AND CAD COMPARISON:  Previous exam(s). ACR Breast Density Category c: The breast tissue is heterogeneously dense, which may obscure small masses. FINDINGS: There are no findings suspicious for malignancy. Images were processed with CAD. IMPRESSION: No mammographic evidence of malignancy. A result letter of this screening mammogram will be mailed directly to the patient. RECOMMENDATION: Screening mammogram in one year. (Code:SM-B-01Y) BI-RADS CATEGORY  1: Negative. Electronically Signed   By: Marin Olp M.D.   On: 02/08/2020 07:57   MS DIGITAL SCREENING TOMO BILATERAL  Result Date: 12/15/2018 CLINICAL DATA:  Screening. EXAM: DIGITAL SCREENING BILATERAL MAMMOGRAM WITH TOMO AND CAD  COMPARISON:  Previous exam(s). ACR Breast Density Category c: The breast tissue is heterogeneously dense, which may obscure small  masses. FINDINGS: There are no findings suspicious for malignancy. Images were processed with CAD. IMPRESSION: No mammographic evidence of malignancy. A result letter of this screening mammogram will be mailed directly to the patient. RECOMMENDATION: Screening mammogram in one year. (Code:SM-B-01Y) BI-RADS CATEGORY  1: Negative. Electronically Signed   By: Lillia Mountain M.D.   On: 12/15/2018 10:34   MS DIGITAL SCREENING TOMO BILATERAL  Result Date: 11/29/2017 CLINICAL DATA:  Screening. EXAM: DIGITAL SCREENING BILATERAL MAMMOGRAM WITH TOMO AND CAD COMPARISON:  Previous exam(s). ACR Breast Density Category c: The breast tissue is heterogeneously dense, which may obscure small masses. FINDINGS: There are no findings suspicious for malignancy. Images were processed with CAD. IMPRESSION: No mammographic evidence of malignancy. A result letter of this screening mammogram will be mailed directly to the patient. RECOMMENDATION: Screening mammogram in one year. (Code:SM-B-01Y) BI-RADS CATEGORY  1: Negative. Electronically Signed   By: Lajean Manes M.D.   On: 11/29/2017 15:22     Pelvic/Bimanual Pap is not indicated today per BCCCP guidelines.   Smoking History: Patient has never smoked.   Patient Navigation: Patient education provided. Access to services provided for patient through [place] program.   Colorectal Cancer Screening: Per patient has had colonoscopy completed on 08/29/2020 at [facility].  No complaints today.    Breast and Cervical Cancer Risk Assessment: Patient has no family history of breast cancer, known genetic mutations, or radiation treatment to the chest before age 2. Patient has a history of cervical dysplasia. Patient has no history of being immunocompromised or DES exposure in-utero.  Risk Assessment     Risk Scores       03/03/2021  02/07/2020   Last edited by: Demetrius Revel, LPN Isla Sabree, Heath Gold, RN   5-year risk: 1 % 0.9 %   Lifetime risk: 6.5 % 6.6 %            A: BCCCP exam without pap smear No complaints.  P: Referred patient to the [facility] for a left breast diagnostic mammogram per recommendation. Appointment scheduled Thursday, March 12, 2021 at 1520.  Loletta Parish, RN 03/03/2021 2:31 PM

## 2021-03-12 ENCOUNTER — Other Ambulatory Visit: Payer: Self-pay

## 2021-03-12 ENCOUNTER — Ambulatory Visit: Payer: No Typology Code available for payment source

## 2021-03-12 ENCOUNTER — Ambulatory Visit
Admission: RE | Admit: 2021-03-12 | Discharge: 2021-03-12 | Disposition: A | Discharge status: Home / Self Care | Payer: No Typology Code available for payment source | Source: Ambulatory Visit | Attending: Obstetrics and Gynecology | Admitting: Obstetrics and Gynecology

## 2021-03-12 ENCOUNTER — Other Ambulatory Visit: Payer: Self-pay | Admitting: Obstetrics and Gynecology

## 2021-03-12 DIAGNOSIS — N6489 Other specified disorders of breast: Secondary | ICD-10-CM

## 2021-06-01 ENCOUNTER — Telehealth: Payer: Self-pay | Admitting: *Deleted

## 2021-06-01 ENCOUNTER — Telehealth: Payer: Self-pay

## 2021-06-01 NOTE — Telephone Encounter (Signed)
Patient called about a bill that patient received that has been turned over to collections. [facility] was from 03/05/20 with Dr. Sabra Heck with the [facility]. Informed patient that bill would not be covered by BCCCP. Patient had other questions about the bill she received. Let patient know that she would need to speak with Etheleen Sia to discuss further. Patient voiced understanding. ?

## 2021-06-01 NOTE — Telephone Encounter (Signed)
Patient called me back with questions about a bill received for date of service 03/05/2020. Explained to patient that it was a visit at the [facility] for fibroids and is not covered by BCCCP. Patient stated she did not remember having the appointment. Let her know I did not include any referral notes on the prior BCCCP visit 02/07/2020 and did not complete a pelvic exam at her [place] appointment, therefore patient would only be referred by [facility] if requested. Advised patient to follow up with the [facility] and/or patient accounting with any questions about the bill received. Patient verbalized understanding ?

## 2021-06-01 NOTE — Telephone Encounter (Signed)
Patient called and left voicemail with questions about a bill she received for date of service 03/05/2020. Attempted to call patient back. Left voicemail for her to call me back. ?

## 2021-12-28 ENCOUNTER — Ambulatory Visit: Payer: No Typology Code available for payment source

## 2021-12-28 DIAGNOSIS — Z124 Encounter for screening for malignant neoplasm of cervix: Secondary | ICD-10-CM

## 2022-01-06 ENCOUNTER — Other Ambulatory Visit: Payer: Self-pay | Admitting: Obstetrics and Gynecology

## 2022-01-06 ENCOUNTER — Encounter: Payer: Self-pay | Admitting: Obstetrics and Gynecology

## 2022-01-06 ENCOUNTER — Other Ambulatory Visit: Payer: Self-pay | Admitting: Nurse Practitioner

## 2022-01-06 DIAGNOSIS — Z1231 Encounter for screening mammogram for malignant neoplasm of breast: Secondary | ICD-10-CM

## 2022-01-11 ENCOUNTER — Other Ambulatory Visit: Payer: Self-pay | Admitting: Hematology and Oncology

## 2022-01-11 DIAGNOSIS — Z124 Encounter for screening for malignant neoplasm of cervix: Secondary | ICD-10-CM

## 2022-01-11 NOTE — Progress Notes (Signed)
Patient: Diane Sexton           Date of Birth: 09-01-1970           MRN: [id number] Visit Date: 01/11/2022 PCP: [facility]     Cervical Exam  Abnormal Observations: Normal exam. Recommendations: Will repeat Pap smear in 5 years if normal with negative HPV.      Patient's History Patient Active Problem List   Diagnosis Date Noted   History of abnormal cervical Pap smear 03/05/2020   Well woman exam with routine gynecological exam 12/14/2018   Bacterial vaginitis 08/25/2012   Essential hypertension, benign 05/11/2012   Overweight (BMI 25.0-29.9) 05/11/2012   Seborrheic keratosis 05/11/2012   Metatarsalgia of left foot 12/15/2011   Bipolar II disorder ([place]) 05/11/2011    Class: Chronic   Obesity 04/05/2011   Chest pain, atypical 11/18/2010   Fibroids 09/18/2010   Elevated TSH 05/22/2010   PMDD 12/24/2009   HYPERLIPIDEMIA 06/09/2009   DEGENERATIVE JOINT DISEASE, SHOULDER 04/28/2009   BACK PAIN 04/28/2009   Past Medical History:  Diagnosis Date   Constipation    Depression    bi-polar   DUB (dysfunctional uterine bleeding)    Fibroids    History of trichomoniasis 2018   Hyperlipemia    Hypertension    PMDD (premenstrual dysphoric disorder)     Family History  Problem Relation Age of Onset   Hypertension Mother    Neuropathy Mother    Hypertension Father    Diabetes Father    Diabetes Mellitus II Father    Diabetes Mellitus II Paternal Aunt    Diabetes Mellitus II Paternal Aunt    Diabetes Mellitus II Paternal Uncle    Dementia Maternal Grandmother    Heart attack Maternal Grandmother 48   Diabetes Mellitus II Paternal Uncle     Social History   Occupational History   Occupation: Therapist, music: [employer]  Tobacco Use   Smoking status: Never   Smokeless tobacco: Never  Vaping Use   Vaping Use: Never used  Substance and Sexual Activity   Alcohol use: Yes    Alcohol/week: 1.0 standard drink  of alcohol    Types: 1 Standard drinks or equivalent per week    Comment: occasionally   Drug use: No   Sexual activity: Not Currently    Partners: Female, Female    Birth control/protection: None

## 2022-01-13 LAB — CYTOLOGY - PAP
Adequacy: ABSENT
Comment: NEGATIVE
Diagnosis: NEGATIVE
High risk HPV: NEGATIVE

## 2022-01-14 ENCOUNTER — Telehealth: Payer: Self-pay

## 2022-01-14 NOTE — Telephone Encounter (Signed)
Patient informed negative Pap/HPV results, next pap due in  5 years, verbalized understanding.

## 2022-02-04 ENCOUNTER — Telehealth: Payer: Self-pay

## 2022-02-04 NOTE — Telephone Encounter (Addendum)
Patient returned call, informed patient will discuss with Lenna Sciara, NP. Referral sent to [facility]. Patient informed.   Patient called and left message regarding a referral for uterine fibroids. Left message on voicemail requesting a return call.

## 2022-02-25 ENCOUNTER — Ambulatory Visit: Payer: Self-pay | Admitting: Hematology and Oncology

## 2022-02-25 ENCOUNTER — Ambulatory Visit
Admission: RE | Admit: 2022-02-25 | Discharge: 2022-02-25 | Disposition: A | Discharge status: Home / Self Care | Payer: No Typology Code available for payment source | Source: Ambulatory Visit | Attending: Obstetrics and Gynecology | Admitting: Obstetrics and Gynecology

## 2022-02-25 VITALS — BP 147/98 | Wt 188.0 lb

## 2022-02-25 DIAGNOSIS — Z1231 Encounter for screening mammogram for malignant neoplasm of breast: Secondary | ICD-10-CM

## 2022-02-25 NOTE — Patient Instructions (Signed)
Taught [name] about breast self awareness and gave educational materials to take home. Patient did not need a Pap smear today due to last Pap smear was in 2023 per patient.  Let her know [facility] will cover Pap smears every 5 years unless has a history of abnormal Pap smears. Referred patient to the [facility] for screening mammogram. Appointment scheduled for 02/25/22. Patient aware of appointment and will be there. Let patient know will follow up with her within the next couple weeks with results. Diane Sexton verbalized understanding.  Melodye Ped, NP 8:36 AM

## 2022-02-25 NOTE — Progress Notes (Signed)
[name] is a 52 y.o. female who presents to [facility] clinic today with no complaints.    Pap Smear: Pap not smear completed today. Last Pap smear was 2023  and was normal. Per patient has history of an abnormal Pap smear. LSIL/ HPV+ 07/2011, 03/2011, 09/2010, 02/2010, 09/2009, 02/2009. Normal in 01/2012, 11/2017, 12/2018, Last Pap smear result is available in Epic.   Physical exam: Breasts Breasts symmetrical. No skin abnormalities bilateral breasts. No nipple retraction bilateral breasts. No nipple discharge bilateral breasts. No lymphadenopathy. No lumps palpated bilateral breasts.     MS DIGITAL DIAG TOMO UNI LEFT  Result Date: 03/12/2021 CLINICAL DATA:  The patient was called back for 2 left breast asymmetries. EXAM: DIGITAL DIAGNOSTIC UNILATERAL LEFT MAMMOGRAM WITH TOMOSYNTHESIS AND CAD TECHNIQUE: Left digital diagnostic mammography and breast tomosynthesis was performed. The images were evaluated with computer-aided detection. COMPARISON:  Previous exam(s). ACR Breast Density Category c: The breast tissue is heterogeneously dense, which may obscure small masses. FINDINGS: The 2 left breast asymmetries resolve on today's imaging. IMPRESSION: No mammographic evidence of malignancy. RECOMMENDATION: Annual screening mammography. I have discussed the findings and recommendations with the patient. If applicable, a reminder letter will be sent to the patient regarding the next appointment. BI-RADS CATEGORY  2: Benign. Electronically Signed   By: Dorise Bullion III M.D.   On: 03/12/2021 15:55  MS DIGITAL SCREENING TOMO BILATERAL  Result Date: 02/20/2021 CLINICAL DATA:  Screening. EXAM: DIGITAL SCREENING BILATERAL MAMMOGRAM WITH TOMOSYNTHESIS AND CAD TECHNIQUE: Bilateral screening digital craniocaudal and mediolateral oblique mammograms were obtained. Bilateral screening digital breast tomosynthesis was performed. The images were evaluated with computer-aided detection. COMPARISON:  Previous  exam(s). ACR Breast Density Category c: The breast tissue is heterogeneously dense, which may obscure small masses. FINDINGS: In the left breast, possible asymmetries warrant further evaluation. In the right breast, no findings suspicious for malignancy. IMPRESSION: Further evaluation is suggested for possible asymmetries in the left breast. RECOMMENDATION: Diagnostic mammogram and possibly ultrasound of the left breast. (Code:FI-L-58M) The patient will be contacted regarding the findings, and additional imaging will be scheduled. BI-RADS CATEGORY  0: Incomplete. Need additional imaging evaluation and/or prior mammograms for comparison. Electronically Signed   By: Kristopher Oppenheim M.D.   On: 02/20/2021 13:39   MS DIGITAL SCREENING TOMO BILATERAL  Result Date: 02/08/2020 CLINICAL DATA:  Screening. EXAM: DIGITAL SCREENING BILATERAL MAMMOGRAM WITH TOMO AND CAD COMPARISON:  Previous exam(s). ACR Breast Density Category c: The breast tissue is heterogeneously dense, which may obscure small masses. FINDINGS: There are no findings suspicious for malignancy. Images were processed with CAD. IMPRESSION: No mammographic evidence of malignancy. A result letter of this screening mammogram will be mailed directly to the patient. RECOMMENDATION: Screening mammogram in one year. (Code:SM-B-01Y) BI-RADS CATEGORY  1: Negative. Electronically Signed   By: Marin Olp M.D.   On: 02/08/2020 07:57   MS DIGITAL SCREENING TOMO BILATERAL  Result Date: 12/15/2018 CLINICAL DATA:  Screening. EXAM: DIGITAL SCREENING BILATERAL MAMMOGRAM WITH TOMO AND CAD COMPARISON:  Previous exam(s). ACR Breast Density Category c: The breast tissue is heterogeneously dense, which may obscure small masses. FINDINGS: There are no findings suspicious for malignancy. Images were processed with CAD. IMPRESSION: No mammographic evidence of malignancy. A result letter of this screening mammogram will be mailed directly to the patient. RECOMMENDATION: Screening  mammogram in one year. (Code:SM-B-01Y) BI-RADS CATEGORY  1: Negative. Electronically Signed   By: Lillia Mountain M.D.   On: 12/15/2018 10:34   MS DIGITAL SCREENING  TOMO BILATERAL  Result Date: 11/29/2017 CLINICAL DATA:  Screening. EXAM: DIGITAL SCREENING BILATERAL MAMMOGRAM WITH TOMO AND CAD COMPARISON:  Previous exam(s). ACR Breast Density Category c: The breast tissue is heterogeneously dense, which may obscure small masses. FINDINGS: There are no findings suspicious for malignancy. Images were processed with CAD. IMPRESSION: No mammographic evidence of malignancy. A result letter of this screening mammogram will be mailed directly to the patient. RECOMMENDATION: Screening mammogram in one year. (Code:SM-B-01Y) BI-RADS CATEGORY  1: Negative. Electronically Signed   By: Lajean Manes M.D.   On: 11/29/2017 15:22      Pelvic/Bimanual Pap is not indicated today    Smoking History: Patient has never smoked and was not referred to quit line.    Patient Navigation: Patient education provided. Access to services provided for patient through [facility] program. No interpreter provided. No transportation provided   Colorectal Cancer Screening: Per patient has had colonoscopy completed on 2022 and will follow up in 5 years.  No complaints today.    Breast and Cervical Cancer Risk Assessment: Patient does not have family history of breast cancer, known genetic mutations, or radiation treatment to the chest before age 10. Patient has history of cervical dysplasia, immunocompromised, or DES exposure in-utero.  Risk Assessment   No risk assessment data for the current encounter  Risk Scores       03/03/2021   Last edited by: Demetrius Revel, LPN   5-year risk: 1 %   Lifetime risk: 6.5 %            A: [facility] exam without pap smear No complaints with benign exam.   P: Referred patient to the [facility] for a screening mammogram. Appointment scheduled 02/25/21.  Melodye Ped, NP 02/25/2022 8:35 AM

## 2022-04-22 ENCOUNTER — Ambulatory Visit (INDEPENDENT_AMBULATORY_CARE_PROVIDER_SITE_OTHER): Payer: Self-pay | Admitting: Obstetrics and Gynecology

## 2022-04-22 ENCOUNTER — Encounter: Payer: Self-pay | Admitting: Obstetrics and Gynecology

## 2022-04-22 ENCOUNTER — Other Ambulatory Visit: Payer: Self-pay

## 2022-04-22 VITALS — BP 156/94 | HR 99 | Wt 188.5 lb

## 2022-04-22 DIAGNOSIS — I10 Essential (primary) hypertension: Secondary | ICD-10-CM | POA: Insufficient documentation

## 2022-04-22 DIAGNOSIS — D219 Benign neoplasm of connective and other soft tissue, unspecified: Secondary | ICD-10-CM

## 2022-04-22 DIAGNOSIS — R102 Pelvic and perineal pain: Secondary | ICD-10-CM | POA: Insufficient documentation

## 2022-04-22 DIAGNOSIS — D259 Leiomyoma of uterus, unspecified: Secondary | ICD-10-CM | POA: Insufficient documentation

## 2022-04-22 NOTE — Progress Notes (Signed)
    CC: abdominal pain, hx of fibroids Subjective:    Patient ID: Diane Sexton, female    DOB: 1970/12/13, 52 y.o.   MRN: [id number]  HPI 52 yo G3P0030 seen for discussion of abdominal pain which she attributes to a history of fibroids.  Pt has been amenorrheic for at least 2 years.  She denies any hot flashes or night sweats.  Pt notes abdominal pain fro greater than 5 years.  The pain is intermittent and sharp.  She has occasional  constipation but no dysuria.  Pt denies nausea/vomiting.  Pt notes dyspareunia which is worse with deeper penetration.  Pt does note a remote hx of uterine fibroid embolization in 2008.  Last ultrasound was from 2013.   Review of Systems     Objective:   Physical Exam Constitutional:      Appearance: Normal appearance.  HENT:     Head: Normocephalic and atraumatic.  Cardiovascular:     Rate and Rhythm: Normal rate and regular rhythm.  Pulmonary:     Effort: Pulmonary effort is normal.     Breath sounds: Normal breath sounds.  Abdominal:     General: There is no distension.     Palpations: There is no mass.     Comments: Equivocal lower abdominal tenderness.  Genitourinary:    Comments: SVE: uterus was moderately enlarged, very mild tenderness.  Uterus is irregular in contour. No adnexal masses Neurological:     Mental Status: She is alert.    Vitals:   04/22/22 1059 04/22/22 1114  BP: (!) 158/98 (!) 156/94  Pulse: 76 99         Assessment & Plan:   1. Fibroids Will get pelvic ultrasound to evaluate current status of fibroids; however, many have already been treated by UFE. - US PELVIC COMPLETE WITH TRANSVAGINAL; Future  2. Pelvic pain in female Unsure of etiology of current discomfort.  Pt is currently post menopausal by definition.  At this time fibroids should be inert due tomenopause.  If there was endometriosis, it should be regressing as well.  3. Uterine leiomyoma, unspecified location   F/u in 6 weeks to discuss  ultrasound and future plan.  Griffin Basil, MD Faculty Attending, Center for [facility]

## 2022-05-04 ENCOUNTER — Ambulatory Visit (HOSPITAL_COMMUNITY): Admission: RE | Admit: 2022-05-04 | Payer: Self-pay | Source: Ambulatory Visit

## 2022-05-07 ENCOUNTER — Ambulatory Visit (HOSPITAL_COMMUNITY)
Admission: RE | Admit: 2022-05-07 | Discharge: 2022-05-07 | Disposition: A | Discharge status: Home / Self Care | Payer: Medicaid Other | Source: Ambulatory Visit | Attending: Obstetrics and Gynecology | Admitting: Obstetrics and Gynecology

## 2022-05-07 DIAGNOSIS — D219 Benign neoplasm of connective and other soft tissue, unspecified: Secondary | ICD-10-CM | POA: Insufficient documentation

## 2022-05-07 DIAGNOSIS — D259 Leiomyoma of uterus, unspecified: Secondary | ICD-10-CM | POA: Diagnosis not present

## 2022-05-13 ENCOUNTER — Encounter: Payer: Self-pay | Admitting: Family Medicine

## 2022-06-07 ENCOUNTER — Other Ambulatory Visit: Payer: Self-pay

## 2022-06-07 ENCOUNTER — Encounter: Payer: Self-pay | Admitting: Obstetrics and Gynecology

## 2022-06-07 ENCOUNTER — Ambulatory Visit (INDEPENDENT_AMBULATORY_CARE_PROVIDER_SITE_OTHER): Payer: Medicaid Other | Admitting: Obstetrics and Gynecology

## 2022-06-07 VITALS — BP 135/86 | HR 96 | Ht 65.0 in | Wt 191.8 lb

## 2022-06-07 DIAGNOSIS — Z1331 Encounter for screening for depression: Secondary | ICD-10-CM | POA: Diagnosis not present

## 2022-06-07 DIAGNOSIS — D259 Leiomyoma of uterus, unspecified: Secondary | ICD-10-CM | POA: Diagnosis not present

## 2022-06-07 NOTE — Progress Notes (Unsigned)
Pt scored 16 on PHQ-9 GAD-7 a 17,sent referral to [facility]

## 2022-06-08 NOTE — BH Specialist Note (Unsigned)
Integrated Behavioral Health via Telemedicine Visit  06/09/2022 Diane Sexton [id number]  Number of Integrated Behavioral Health Clinician visits: 1- Initial Visit  Session Start time: 0825   Session End time: 0840  Total time in minutes: 15   Referring Provider: Mariel Aloe, MD Patient/Family location: Home [facility] Provider location: Center for [facility] at [facility]  All persons participating in visit: Patient Diane Sexton and Diane Sexton   Types of Service: Individual psychotherapy and Video visit  I connected with Diane Sexton and/or Diane Sexton  n/a  via  Telephone or Video Enabled Telemedicine Application  (Video is Caregility application) and verified that I am speaking with the correct person using two identifiers. Discussed confidentiality: Yes   I discussed the limitations of telemedicine and the availability of in person appointments.  Discussed there is a possibility of technology failure and discussed alternative modes of communication if that failure occurs.  I discussed that engaging in this telemedicine visit, they consent to the provision of behavioral healthcare and the services will be billed under their insurance.  Patient and/or legal guardian expressed understanding and consented to Telemedicine visit: Yes ; consented to 15 minutes only due to uninsured.  Presenting Concerns: Patient and/or family reports the following symptoms/concerns: Fatigue, overeating, poor self-esteem, poor concentration, anxiety, irritability, dread; pt attributes to side effects of menopause last two years, along with trouble sleeping, weight gain and pelvic pain due to fibroids; fibroids have made sexual intimacy painful. No current SI, no intent and no plan.  Duration of problem: Changes over past two years; Severity of problem: severe  Patient and/or Family's Strengths/Protective Factors: Sense of purpose  Goals  Addressed: Patient will:  Reduce symptoms of: anxiety, depression, and insomnia   Increase knowledge and/or ability of: healthy habits and self-management skills   Demonstrate ability to: Increase healthy adjustment to current life circumstances and Increase motivation to adhere to plan of care  Progress towards Goals: Ongoing  Interventions: Interventions utilized:  Mindfulness or Management consultant, Psychoeducation and/or Health Education, and Link to [employer] Standardized Assessments completed: Not Needed  Patient and/or Family Response: Patient agrees with treatment plan.   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   Patient may benefit from psychoeducation and brief therapeutic interventions regarding coping with symptoms of anxiety, depression .  Plan: Follow up with behavioral health clinician on : Two weeks Behavioral recommendations:  -CALM relaxation breathing exercise twice daily (morning; at bedtime with sleep sounds and ceiling fan; as needed throughout the Diane. -Continue plan to begin taking Vitamin D, as recommended by medical provider -Read through information on After Visit Summary; use as needed and discussed Referral(s): Integrated Art gallery manager (In Clinic), Community Mental Health Services (LME/Outside Clinic), and Community Resources:  [facility]  I discussed the assessment and treatment plan with the patient and/or parent/guardian. They were provided an opportunity to ask questions and all were answered. They agreed with the plan and demonstrated an understanding of the instructions.   They were advised to call back or seek an in-person evaluation if the symptoms worsen or if the condition fails to improve as anticipated.  Diane Close [name], LCSW     06/07/2022    1:32 PM 03/05/2020    2:43 PM  Depression screen PHQ 2/9  Decreased Interest 1 0  Down, Depressed,  Hopeless 2 0  PHQ - 2 Score 3 0  Altered sleeping 0 3  Tired,  decreased energy 3 2  Change in appetite 3 3  Feeling bad or failure about yourself  3 1  Trouble concentrating 3 0  Moving slowly or fidgety/restless 0 0  Suicidal thoughts 1 0  PHQ-9 Score 16 9      06/07/2022    1:32 PM 03/05/2020    2:43 PM  GAD 7 : Generalized Anxiety Score  Nervous, Anxious, on Edge 3 2  Control/stop worrying 2 1  Worry too much - different things 2 1  Trouble relaxing 2 3  Restless 2 1  Easily annoyed or irritable 3 1  Afraid - awful might happen 3 0  Total GAD 7 Score 17 9

## 2022-06-08 NOTE — Progress Notes (Signed)
  CC: fibroid follow up Subjective:    Patient ID: Diane Sexton, female    DOB: 02-26-1970, 52 y.o.   MRN: [id number]  HPI Pt seen for follow up of ultrasound and discussion of symptoms.  Pt is postmenopausal and also has undergone UFE in the past.  Chart reviewed to look at past ultrasounds.  Current uterine size is smaller than previous.  Pt still complains of abdominal bloating as well as back pain and lower abdominal pain.  The patient attributes these symptoms to her fibroids.  MD explained to patient that her fibroids are largely inert and due to small size are very unlikely to be the cause of these symptoms, especially the abdominal bloating.  Pt was not in agreement, but is open to a second opinion from a female gyn.   Review of Systems     Objective:   Physical Exam Vitals:   06/07/22 1326  BP: 135/86  Pulse: 96   CLINICAL DATA:  Pelvic pain. History of uterine fibroids, status post UFE.   EXAM: TRANSABDOMINAL ULTRASOUND OF PELVIS   TECHNIQUE: Transabdominal ultrasound examination of the pelvis was performed including evaluation of the uterus, ovaries, adnexal regions, and pelvic cul-de-sac.   COMPARISON:  Ultrasound pelvis 07/29/2011.   FINDINGS: Uterus   Measurements: 8.4 x 4.7 x 5.8 cm = volume: 120.6 mL. Three uterine fibroids are noted measuring 1.7 x 2.7 x 2.2 cm, 1.9 x 2.0 x 1.8 cm, and 2.5 x 3.2 x 2.3 cm. Uterine echotexture is heterogeneous, possibly secondary to prior UFE.   Endometrium   Thickness: 4 mm.  No focal abnormality visualized.   Right ovary   Measurements: 2.3 x 1.3 x 1.8 cm. = volume 2.8 mL normal appearance/no adnexal mass.   Left ovary   Measurements: 2.8 x 1.7 x 2.5 cm = volume: 6.2 mL. Normal appearance/no adnexal mass.   Other findings: No abnormal free fluid. Exam limited due to overlying bowel gas.   IMPRESSION: 1. Fibroid uterus as above. Heterogeneous uterine echotexture, possibly secondary to prior UFE.   2.  Ovaries and adnexa appear unremarkable. No abnormal free fluid. Exam was limited due to overlying bowel gas.        Assessment & Plan:   1. Positive depression screening Pt notes mood changes s/p menopause and would like to discuss with counselor. - Ambulatory referral to Integrated Behavioral Health  2. Uterine leiomyoma, unspecified location Pt will discuss with female gyn for second opinion.  Pt advised myomectomy or hysterectomy is unlikely to relieve the symptoms as the fibroids have already been treated and are smaller than previous.   I spent 20 minutes dedicated to the care of this patient including previsit review of records, face to face time with the patient discussing ultrasound findings, physiology, treatment options and post visit testing.  Warden Fillers, MD Faculty Attending, Center for [facility]

## 2022-06-09 ENCOUNTER — Ambulatory Visit: Payer: Self-pay | Admitting: Clinical

## 2022-06-09 DIAGNOSIS — F4323 Adjustment disorder with mixed anxiety and depressed mood: Secondary | ICD-10-CM

## 2022-06-09 NOTE — Patient Instructions (Signed)
Center for [facility] Healthcare at [facility] for Women [address] [place], [place] [address] [phone] (main office) [phone] (Malachai Schalk's office)  [facility] [email or web address]  Medicaid expansion in [place]:  -Apply in person or call at local DSS SocialFulfillment.hu -Or online at: [email or web address] -Free help to figure out healthcare options: Call Navigator at [phone]   [facility]  [address] [place], [place] [address] [phone] or [phone] [facility] 24/7 FOR ANYONE [address] [place], [place]  [phone] Fax: [phone] [email or web address] *Interpreters available *Accepts all insurance and uninsured for Urgent Care needs *Accepts Medicaid and uninsured for outpatient treatment (below)    ONLY FOR [facility]  Below:   Outpatient New Patient Assessment/Therapy Walk-ins:        Monday -Thursday 8am until slots are full.        Every Friday 1pm-4pm  (first come, first served)                   New Patient Psychiatry/Medication Management        Monday-Friday 8am-11am (first come, first served)              For all walk-ins we ask that you arrive by 7:15am, because patients will be seen in the order of arrival.   [employer] and Websites Here are a few free apps meant to help you to help yourself.  To find, try searching on the internet to see if the app is offered on Apple/Android devices. If your first choice doesn't come up on your device, the good news is that there are many choices! Play around with different apps to see which ones are helpful to you.    Calm This is an app meant to help increase calm feelings. Includes info, strategies, and tools for tracking your feelings.      Calm Harm  This app is meant to help with self-harm. Provides many 5-minute or 15-min coping strategies for doing instead of hurting yourself.       Healthy Minds Health Minds is  a problem-solving tool to help deal with emotions and cope with stress you encounter wherever you are.      MindShift This app can help people cope with anxiety. Rather than trying to avoid anxiety, you can make an important shift and face it.      MY3  MY3 features a support system, safety plan and resources with the goal of offering a tool to use in a time of need.       My Life My Voice  This mood journal offers a simple solution for tracking your thoughts, feelings and moods. Animated emoticons can help identify your mood.       Relax Melodies Designed to help with sleep, on this app you can mix sounds and meditations for relaxation.      Smiling Mind Smiling Mind is meditation made easy: it's a simple tool that helps put a smile on your mind.        Stop, Breathe & Think  A friendly, simple guide for people through meditations for mindfulness and compassion.  Stop, Breathe and Think Kids Enter your current feelings and choose a "mission" to help you cope. Offers videos for certain moods instead of just sound recordings.       Team Orange The goal of this tool is to help teens change how they think, act, and react. This app helps you focus on your own good feelings  and experiences.      The [employer] Box The [employer] Box (VHB) contains simple tools to help patients with coping, relaxation, distraction, and positive thinking.

## 2022-06-09 NOTE — BH Specialist Note (Signed)
Integrated Behavioral Health via Telemedicine Visit  06/23/2022 KRYSTAL CANJURA [id number]  Number of Integrated Behavioral Health Clinician visits: 2- Second Visit  Session Start time: [phone]   Session End time: 1058  Total time in minutes: 62   Referring Provider: Mariel Aloe, MD Patient/Family location: Home [facility] Provider location: Center for [facility] at [facility] for [facility]  All persons participating in visit: Patient Birdella Kilgore and [facility] Infantof Villagomez   Types of Service: Individual psychotherapy and Video visit  I connected with Aleeya L Hamblen and/or Fleeta L Waller's  n/a  via  Telephone or Video Enabled Telemedicine Application  (Video is Caregility application) and verified that I am speaking with the correct person using two identifiers. Discussed confidentiality: Yes   I discussed the limitations of telemedicine and the availability of in person appointments.  Discussed there is a possibility of technology failure and discussed alternative modes of communication if that failure occurs.  I discussed that engaging in this telemedicine visit, they consent to the provision of behavioral healthcare and the services will be billed under their insurance.  Patient and/or legal guardian expressed understanding and consented to Telemedicine visit: Yes   Presenting Concerns: Patient and/or family reports the following symptoms/concerns: Unemployment, partially attributed to memories of childhood trauma causing depression, anxiety and moments of mood instability; pt interested in restarting Zoloft that managed symptoms years ago; currently coping with supportive people in her life; thankful to be approved for Medicaid after being uninsured.  Duration of problem: Ongoing; Severity of problem: severe  Patient and/or Family's Strengths/Protective Factors: Social connections and Sense of purpose  Goals Addressed: Patient will:  Reduce symptoms  of: anxiety, depression, insomnia, mood instability, and stress   Increase knowledge and/or ability of: stress reduction   Demonstrate ability to: Increase healthy adjustment to current life circumstances and Increase motivation to adhere to plan of care  Progress towards Goals: Ongoing  Interventions: Interventions utilized:  Solution-Focused Strategies and Supportive Reflection Standardized Assessments completed: Not Needed  Patient and/or Family Response: Patient agrees with treatment plan.   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   Patient may benefit from continued therapeutic interventions and referral to psychiatry/further assessment.  Plan: Follow up with behavioral health clinician on : Two weeks Behavioral recommendations:  -Begin allowing feelings to come, without trying to push them away. Consider writing them down, as discussed -Continue plan to begin taking Vitamin D (and other vitamins, after talking to medical provider)  -Restart daily relaxation breathing exercises, as part of self-care -Call [facility] again, to set up time to help update resume Referral(s): Integrated [employer] (In Clinic)  I discussed the assessment and treatment plan with the patient and/or parent/guardian. They were provided an opportunity to ask questions and all were answered. They agreed with the plan and demonstrated an understanding of the instructions.   They were advised to call back or seek an in-person evaluation if the symptoms worsen or if the condition fails to improve as anticipated.  Valetta Close Gregory Barrick, LCSW     06/07/2022    1:32 PM 03/05/2020    2:43 PM  Depression screen PHQ 2/9  Decreased Interest 1 0  Down, Depressed, Hopeless 2 0  PHQ - 2 Score 3 0  Altered sleeping 0 3  Tired, decreased energy 3 2  Change in appetite 3 3  Feeling bad or failure about  yourself  3 1  Trouble concentrating 3  0  Moving slowly or fidgety/restless 0 0  Suicidal thoughts 1 0  PHQ-9 Score 16 9      06/07/2022    1:32 PM 03/05/2020    2:43 PM  GAD 7 : Generalized Anxiety Score  Nervous, Anxious, on Edge 3 2  Control/stop worrying 2 1  Worry too much - different things 2 1  Trouble relaxing 2 3  Restless 2 1  Easily annoyed or irritable 3 1  Afraid - awful might happen 3 0  Total GAD 7 Score 17 9

## 2022-06-23 ENCOUNTER — Ambulatory Visit (INDEPENDENT_AMBULATORY_CARE_PROVIDER_SITE_OTHER): Payer: Medicaid Other | Admitting: Clinical

## 2022-06-23 DIAGNOSIS — F4323 Adjustment disorder with mixed anxiety and depressed mood: Secondary | ICD-10-CM

## 2022-06-23 DIAGNOSIS — Z658 Other specified problems related to psychosocial circumstances: Secondary | ICD-10-CM

## 2022-06-23 NOTE — Patient Instructions (Signed)
[facility] [address] [place], [place] [address] [phone] (main office) [phone] (Storey Stangeland's office)   

## 2022-06-25 NOTE — BH Specialist Note (Deleted)
Integrated Behavioral Health via Telemedicine Visit  06/25/2022 SHAHERAH FERRIS [id number]  Number of Integrated Behavioral Health Clinician visits: 2- Second Visit  Session Start time: [phone]   Session End time: 1058  Total time in minutes: 62   Referring Provider: Mariel Aloe, MD Patient/Family location: Home*** [facility] Provider location: Center for [facility] at [facility]  All persons participating in visit: Patient Diane Sexton and [facility] [place] ***  Types of Service: {CHL AMB TYPE OF [phone]}  I connected with Romaine L Apsey and/or Rickita L Glinski's {family members:20773} via  Telephone or Video Enabled Telemedicine Application  (Video is Caregility application) and verified that I am speaking with the correct person using two identifiers. Discussed confidentiality: Yes   I discussed the limitations of telemedicine and the availability of in person appointments.  Discussed there is a possibility of technology failure and discussed alternative modes of communication if that failure occurs.  I discussed that engaging in this telemedicine visit, they consent to the provision of behavioral healthcare and the services will be billed under their insurance.  Patient and/or legal guardian expressed understanding and consented to Telemedicine visit: Yes   Presenting Concerns: Patient and/or family reports the following symptoms/concerns: *** Duration of problem: ***; Severity of problem: {Mild/Moderate/Severe:20260}  Patient and/or Family's Strengths/Protective Factors: {CHL AMB BH PROTECTIVE [phone]}  Goals Addressed: Patient will:  Reduce symptoms of: {IBH Symptoms:21014056}   Increase knowledge and/or ability of: {IBH Patient Tools:21014057}   Demonstrate ability to: {IBH Goals:21014053}  Progress towards Goals: {CHL AMB BH PROGRESS TOWARDS GOALS:[phone]}  Interventions: Interventions utilized:   {IBH Interventions:21014054} Standardized Assessments completed: {IBH Screening Tools:21014051}  Patient and/or Family Response: Patient agrees with treatment plan. ***  Assessment: Patient currently experiencing ***.   Patient may benefit from continued therapeutic intervention *** .  Plan: Follow up with behavioral health clinician on : *** Behavioral recommendations:  -*** -*** Referral(s): {IBH Referrals:21014055}  I discussed the assessment and treatment plan with the patient and/or parent/guardian. They were provided an opportunity to ask questions and all were answered. They agreed with the plan and demonstrated an understanding of the instructions.   They were advised to call back or seek an in-person evaluation if the symptoms worsen or if the condition fails to improve as anticipated.  Valetta Close Royann Wildasin, LCSW     06/07/2022    1:32 PM 03/05/2020    2:43 PM  Depression screen PHQ 2/9  Decreased Interest 1 0  Down, Depressed, Hopeless 2 0  PHQ - 2 Score 3 0  Altered sleeping 0 3  Tired, decreased energy 3 2  Change in appetite 3 3  Feeling bad or failure about yourself  3 1  Trouble concentrating 3 0  Moving slowly or fidgety/restless 0 0  Suicidal thoughts 1 0  PHQ-9 Score 16 9      06/07/2022    1:32 PM 03/05/2020    2:43 PM  GAD 7 : Generalized Anxiety Score  Nervous, Anxious, on Edge 3 2  Control/stop worrying 2 1  Worry too much - different things 2 1  Trouble relaxing 2 3  Restless 2 1  Easily annoyed or irritable 3 1  Afraid - awful might happen 3 0  Total GAD 7 Score 17 9

## 2022-07-03 DIAGNOSIS — Z419 Encounter for procedure for purposes other than remedying health state, unspecified: Secondary | ICD-10-CM | POA: Diagnosis not present

## 2022-07-06 DIAGNOSIS — I10 Essential (primary) hypertension: Secondary | ICD-10-CM | POA: Diagnosis not present

## 2022-07-06 DIAGNOSIS — E785 Hyperlipidemia, unspecified: Secondary | ICD-10-CM | POA: Diagnosis not present

## 2022-07-06 DIAGNOSIS — R0789 Other chest pain: Secondary | ICD-10-CM | POA: Diagnosis not present

## 2022-07-15 NOTE — BH Specialist Note (Signed)
Error; rescheduled for Wednesday, 07/28/22.

## 2022-07-20 ENCOUNTER — Encounter (HOSPITAL_COMMUNITY): Payer: Self-pay | Admitting: Emergency Medicine

## 2022-07-20 ENCOUNTER — Emergency Department (HOSPITAL_COMMUNITY)
Admission: EM | Admit: 2022-07-20 | Discharge: 2022-07-20 | Disposition: A | Discharge status: Home / Self Care | Payer: Medicaid Other | Attending: Emergency Medicine | Admitting: Emergency Medicine

## 2022-07-20 DIAGNOSIS — M79604 Pain in right leg: Secondary | ICD-10-CM | POA: Diagnosis not present

## 2022-07-20 DIAGNOSIS — M79661 Pain in right lower leg: Secondary | ICD-10-CM | POA: Diagnosis not present

## 2022-07-20 LAB — BASIC METABOLIC PANEL
Anion gap: 9 (ref 5–15)
BUN: 21 mg/dL — ABNORMAL HIGH (ref 6–20)
CO2: 27 mmol/L (ref 22–32)
Calcium: 9.5 mg/dL (ref 8.9–10.3)
Chloride: 102 mmol/L (ref 98–111)
Creatinine, Ser: 0.81 mg/dL (ref 0.44–1.00)
GFR, Estimated: >60 mL/min (ref >60)
Glucose, Bld: 129 mg/dL — ABNORMAL HIGH (ref 70–99)
Potassium: 3.1 mmol/L — ABNORMAL LOW (ref 3.5–5.1)
Sodium: 138 mmol/L (ref 135–145)

## 2022-07-20 LAB — CBC WITH DIFFERENTIAL/PLATELET
Abs Immature Granulocytes: 0.08 10*3/uL — ABNORMAL HIGH (ref 0.00–0.07)
Basophils Absolute: 0.1 10*3/uL (ref 0.0–0.1)
Basophils Relative: 1 %
Eosinophils Absolute: 0.3 10*3/uL (ref 0.0–0.5)
Eosinophils Relative: 2 %
HCT: 39.6 % (ref 36.0–46.0)
Hemoglobin: 13.4 g/dL (ref 12.0–15.0)
Immature Granulocytes: 1 %
Lymphocytes Relative: 28 %
Lymphs Abs: 3.7 10*3/uL (ref 0.7–4.0)
MCH: 27.5 pg (ref 26.0–34.0)
MCHC: 33.8 g/dL (ref 30.0–36.0)
MCV: 81.3 fL (ref 80.0–100.0)
Monocytes Absolute: 1 10*3/uL (ref 0.1–1.0)
Monocytes Relative: 8 %
Neutro Abs: 7.8 10*3/uL — ABNORMAL HIGH (ref 1.7–7.7)
Neutrophils Relative %: 60 %
Platelets: 308 10*3/uL (ref 150–400)
RBC: 4.87 MIL/uL (ref 3.87–5.11)
RDW: 14.4 % (ref 11.5–15.5)
WBC: 13 10*3/uL — ABNORMAL HIGH (ref 4.0–10.5)
nRBC: 0 % (ref 0.0–0.2)

## 2022-07-20 MED ORDER — IBUPROFEN 600 MG PO TABS: 600.0000 mg | ORAL_TABLET | Freq: Three times a day (TID) | ORAL | 0 refills | Status: DC

## 2022-07-20 NOTE — ED Provider Notes (Signed)
[place] EMERGENCY DEPARTMENT AT [facility] Provider Note   CSN: [id number] Arrival date & time: 07/20/22  1919     History  Chief Complaint  Patient presents with   Leg Pain    Diane Sexton is a 52 y.o. female.  HPI    52 year old female comes in with chief complaint of leg pain.  Patient states that she has been having calf pain for the last 6 months.  She initially thought that the pain was because of strain (from intercourse), but recently she had groin pain.  There is family history of blood clots, and she wants to make sure she does not have it.   Pt has no hx of PE, DVT and denies any exogenous hormone (testosterone / estrogen) use, long distance travels or surgery in the past 6 weeks, active cancer, recent immobilization.   Home Medications Prior to Admission medications   Medication Sig Start Date End Date Taking? Authorizing Provider  ibuprofen (ADVIL) 600 MG tablet Take 1 tablet (600 mg total) by mouth 3 (three) times daily. 07/20/22  Yes Senai Ramnath, MD  losartan (COZAAR) 25 MG tablet Take 1 tablet by mouth daily. Patient not taking: Reported on 04/22/2022    [provider]      Allergies    Oxycodone, Cyclobenzaprine, and Hydrocodone-apap-dietary prod    Review of Systems   Review of Systems  All other systems reviewed and are negative.   Physical Exam Updated Vital Signs BP (!) 175/101 (BP Location: Right Arm)   Pulse 99   Temp 98.6 F (37 C) (Oral)   Resp 18   SpO2 99%  Physical Exam Vitals and nursing note reviewed.  Constitutional:      Appearance: She is well-developed.  HENT:     Head: Normocephalic and atraumatic.  Eyes:     Extraocular Movements: Extraocular movements intact.  Cardiovascular:     Rate and Rhythm: Normal rate.  Pulmonary:     Effort: Pulmonary effort is normal.  Musculoskeletal:     Cervical back: Normal range of motion and neck supple.     Comments: Right calf tenderness, no  unilateral swelling  Skin:    General: Skin is dry.  Neurological:     Mental Status: She is alert and oriented to person, place, and time.     ED Results / Procedures / Treatments   Labs (all labs ordered are listed, but only abnormal results are displayed) Labs Reviewed  BASIC METABOLIC PANEL  CBC WITH DIFFERENTIAL/PLATELET    EKG None  Radiology No results found.  Procedures Procedures    Medications Ordered in ED Medications - No data to display  ED Course/ Medical Decision Making/ A&P                             Medical Decision Making Amount and/or Complexity of Data Reviewed Labs: ordered.  Risk Prescription drug management.   52 year old patient comes in with chief complaint of calf pain. There is family history of DVT.  Patient's sister, mother and father all had DVT.  However there is no clotting disorder.  She has constant calf pain for the last 6 months.  She is not on any statin.  Exam not consistent with musculoskeletal strain/sprain.  Differential considered for this patient includes DVT, musculoskeletal sprain/strain, myositis, myalgias, medication side effect.  Patient does not have any chest pain, shortness of breath.  Plan is to discharge her  with outpatient DVT study for tomorrow.  Final Clinical Impression(s) / ED Diagnoses Final diagnoses:  Right leg pain  Right calf pain    Rx / DC Orders ED Discharge Orders          Ordered    LE Venous       Comments: IMPORTANT PATIENT INSTRUCTIONS:  You have been scheduled for an Outpatient Vascular Study at [facility].    If tomorrow is a Saturday, Sunday or holiday, please go to the [facility] Registration Desk at 11 am tomorrow morning and tell them you are there for a vascular study.   If tomorrow is a weekday (Monday-Friday), please go to [facility] Registration at 11 am and tell them you are there for a  vascular study.   07/20/22 2122    ibuprofen (ADVIL) 600 MG tablet  3 times daily        06 /18/24 2124              Derwood Kaplan, MD 07/20/22 2129

## 2022-07-20 NOTE — Discharge Instructions (Addendum)
We have ordered DVT study for tomorrow, please follow the instructions provided to get it done. As discussed, take ibuprofen for the next 3 to 5 days.  Follow-up with your PCP if the symptoms continue.

## 2022-07-20 NOTE — ED Triage Notes (Signed)
Pt reports leg pain for 6 mos following a squatting position during sexual intercourse. She states calf muscle been hurting for 6 mos and nothing makes pain better or worse. Wants to r/o blood clot. No chest pain or SOB.

## 2022-07-21 ENCOUNTER — Ambulatory Visit (HOSPITAL_COMMUNITY)
Admission: RE | Admit: 2022-07-21 | Discharge: 2022-07-21 | Disposition: A | Discharge status: Home / Self Care | Payer: Medicaid Other | Source: Ambulatory Visit | Attending: Emergency Medicine | Admitting: Emergency Medicine

## 2022-07-21 DIAGNOSIS — I82409 Acute embolism and thrombosis of unspecified deep veins of unspecified lower extremity: Secondary | ICD-10-CM | POA: Diagnosis not present

## 2022-07-21 DIAGNOSIS — M79609 Pain in unspecified limb: Secondary | ICD-10-CM

## 2022-07-21 DIAGNOSIS — M79604 Pain in right leg: Secondary | ICD-10-CM | POA: Diagnosis not present

## 2022-07-21 NOTE — Progress Notes (Signed)
*  PRELIMINARY RESULTS* Vascular Ultrasound Right lower extremity duplex has been completed.  Preliminary findings: No evidence of a DVT or SVT in the right lower extremity.   Laddie Aquas 07/21/2022, 11:23 AM

## 2022-07-26 ENCOUNTER — Ambulatory Visit: Payer: Medicaid Other | Admitting: Clinical

## 2022-07-27 NOTE — BH Specialist Note (Unsigned)
Integrated Behavioral Health via Telemedicine Visit  07/28/2022 DASHONDA BONNEAU [id number]  Number of Integrated Behavioral Health Clinician visits: 3- Third Visit  Session Start time: 0916   Session End time: 0956  Total time in minutes: 40   Referring Provider: Mariel Aloe, MD Patient/Family location: Home [facility] Provider location: [facility] for [facility] at [facility]  All persons participating in visit: Patient Diane Sexton and [facility] Elvyn Krohn   Types of Service: Individual psychotherapy and Video visit  I connected with Ilze L Kriz and/or Nancylee L Newman's  n/a  via  Telephone or Video Enabled Telemedicine Application  (Video is Caregility application) and verified that I am speaking with the correct person using two identifiers. Discussed confidentiality: Yes   I discussed the limitations of telemedicine and the availability of in person appointments.  Discussed there is a possibility of technology failure and discussed alternative modes of communication if that failure occurs.  I discussed that engaging in this telemedicine visit, they consent to the provision of behavioral healthcare and the services will be billed under their insurance.  Patient and/or legal guardian expressed understanding and consented to Telemedicine visit: Yes   Presenting Concerns: Patient and/or family reports the following symptoms/concerns: Grieving loss of father, followed by stress of finishing last week of classes before graduating with her Associate's degree and impending housing instability; pt's goals are to find work and stable housing prior to beginning [employer] in the fall.  Duration of problem: Ongoing life stress with recent loss of father; Severity of problem: severe  Patient and/or Family's Strengths/Protective Factors: Social connections and Sense of purpose  Goals Addressed: Patient will:  Reduce symptoms of: anxiety,  depression, insomnia, mood instability, and stress   Increase knowledge and/or ability of: stress reduction   Demonstrate ability to: Begin healthy grieving over loss  Progress towards Goals: Ongoing  Interventions: Interventions utilized:  Motivational Interviewing, Link to [employer], and Supportive Reflection Standardized Assessments completed: Not Needed  Patient and/or Family Response: Patient agrees with treatment plan.   Assessment: Patient currently experiencing Grief; Adjustment disorder with mixed anxious and depressed mood; Psychosocial stress  Patient may benefit from continued therapeutic intervention  .  Plan: Follow up with behavioral health clinician on : Two weeks Behavioral recommendations:  -Prioritize healthy self care daily during this time (following medical provider recommendations, relaxation breathing exercises, adequate rest and routine meals) -Consider inquiring about housing rights via [employer]; use any other housing resources on After Visit Summary as needed -Continue plan to finalize updates to resume; ask about internship opportunities, as discussed Referral(s): Integrated [employer] (In Clinic) and [employer]:  Housing and Women's resources  I discussed the assessment and treatment plan with the patient and/or parent/guardian. They were provided an opportunity to ask questions and all were answered. They agreed with the plan and demonstrated an understanding of the instructions.   They were advised to call back or seek an in-person evaluation if the symptoms worsen or if the condition fails to improve as anticipated.  Valetta Close Deontrey Massi, LCSW     06/07/2022    1:32 PM 03/05/2020    2:43 PM  Depression screen PHQ 2/9  Decreased Interest 1 0  Down, Depressed, Hopeless 2 0  PHQ - 2 Score 3 0  Altered sleeping 0 3  Tired, decreased energy 3 2  Change in appetite 3 3  Feeling bad or failure about  yourself  3 1  Trouble concentrating 3  0  Moving slowly or fidgety/restless 0 0  Suicidal thoughts 1 0  PHQ-9 Score 16 9      06/07/2022    1:32 PM 03/05/2020    2:43 PM  GAD 7 : Generalized Anxiety Score  Nervous, Anxious, on Edge 3 2  Control/stop worrying 2 1  Worry too much - different things 2 1  Trouble relaxing 2 3  Restless 2 1  Easily annoyed or irritable 3 1  Afraid - awful might happen 3 0  Total GAD 7 Score 17 9

## 2022-07-28 ENCOUNTER — Ambulatory Visit (INDEPENDENT_AMBULATORY_CARE_PROVIDER_SITE_OTHER): Payer: Medicaid Other | Admitting: Clinical

## 2022-07-28 DIAGNOSIS — F4321 Adjustment disorder with depressed mood: Secondary | ICD-10-CM

## 2022-07-28 DIAGNOSIS — F4323 Adjustment disorder with mixed anxiety and depressed mood: Secondary | ICD-10-CM

## 2022-07-28 DIAGNOSIS — Z658 Other specified problems related to psychosocial circumstances: Secondary | ICD-10-CM

## 2022-07-28 NOTE — Patient Instructions (Signed)
Center for [facility] [address] [place], [place] [address] [phone] (main office) [phone] (Makaila Windle's office)  [facility] www.womenscentergso.org  Housing Resources                    [employer] (serves [place], [place], [place], [place], [place], [place], [place], [place], [place], [place], [place], [place], and [place] counties) [address], [place], [place] [address] [phone] DeveloperU.ch  **Rental assistance, Home Rehabilitation,Weatherization Assistance Program, Chief Financial Officer, Housing Voucher Program  [employer] for Housing and [employer] Studies: Proofreader Resources to residents of [place], [place], and [place] Make sure you have your documents ready, including:  (Household income verification: 2 months pay stubs, unemployment/social security award letter, statement of no income for all household members over 41) Photo ID for all household members over 18 Utility Bill/Rent Ledger/Lease: must show past due amount for utilities/rent, or the rental agreement if rent is current 2. Start your application online or by paper (in [place] or [place]) at:     [email or web address].[email or web address]  3. Once you have completed the online application, you will get an email confirmation message from the county. Expect to hear back by phone or email at least 6-10 weeks from submitting your application.  4. While you wait:  Call [phone] to check in on your application Let your landlord know that you've applied. Your landlord will be asked to submit documents (W-9) during this application process. Payments will be made directly to the landlord/property management company and utility company Rent or utility assistance for [employer], [place], and [employer] Apply at  [email or web address] Questions? Call or email Renee at [phone] or [email or web address]   Eviction Mediation Program: The HOPE Program Https://www.rebuild.[email or web address].cy HOPE Progam serves low-income renters in [address] [place] counties, defined as less than or equal to 80% of the area median income for the county where the renter lives. In the following 12 counties, you should apply to your local rent and utility assistance program INSTEAD OF the HOPE Program: [place], [place], [place], [place], [place], [place], [place], [place], [place], [place], [place], [place]  If you live outside of [place], contact HOPE call center at [phone] to talk to a Program Representative Monday-Friday, 8am-5pm Note that Native American tribes also received federal funding for rent and utility assistance programs. Recognized members of the following tribes will be served by programs managed by tribal governments, including: [place] Indians, [place], [place], [place] of [place] and [facility] Eviction Mediation Coordinator, Ben Arnold, [phone] [email or web address] .[employer], [place], [phone] [email or web address]   Housing Resources [facility] [address] [place], [place] [address] [phone] www.gha-[place].[facility] [address] [place], [place] [address] [phone] PhoneCaptions.ch **Programs include: Hospital doctor and Housing Counseling, Healthy Radiographer, therapeutic, Homeless Prevention and Housing Assistance  Government [facility] [address], [place], [place] [address] [phone] www.PaintingEmporium.co.za **housing applications/recertification; tax payment relief/exemption under specific qualifications  [facility] [address], [place], [place]  [address] www.onlinegreensboro.com/~maryshouse **transitional housing for women in recovery who have minor children or are pregnant  [facility] [address] [place], [place], [place] [address] https://johnson-smith.[email or web address]  **emergency shelter and support services for families facing homelessness  [facility] [address] [place], [place] [address] [phone] www.youthfocus.org **transitional housing to pregnant women; emergency housing for youth who have run  away, are experiencing a family crisis, are victims of abuse or neglect, or are homeless  [facility] [address] [place], [place] [address] [phone] ircgso.org **Drop-in center for people experiencing homelessness; overnight warming center when temperature is 25 degrees or below  [employer] [address] [place], [place], [place] [address] [phone] https://reentrystaffingagency.org/ **help with affordable housing to people experiencing homelessness or unemployment due to incarceration  [facility] [address], [place], [place] [address] [phone] www.greensborourbanministry.org  **emergency and transitional housing, rent/mortgage assistance, utility assistance  [facility] [address], [place], [place] [address] [phone] www.salvationarmyofgreensboro.org **emergency and transitional housing  Habitat for [employer] [address], [place], [place] [address] [phone] Www.habitatgreensboro.[facility]   [employer] [address], [place], [place] [address] [phone] https://chshousing.org **Home Ownership/Affordable Housing Program and [facility]  [employer] [facility] [address], [place], [place] [address] [phone] PaidValue.com.cy **home buyer education courses, foreclosure prevention  [facility] [address], [place], [place] [address] [phone] DefMagazine.is **Environmental Exposure Assessment (investigation of homes where either children or pregnant women with a confirmed elevated blood lead level reside)  [facility] [address] Nat Math [place], [place] [address] [phone] ShowReturn.ca **Home Expense Assistance/Repairs Program; offers home accessibility updates, such as ramps or bars in the bathroom  [facility] [address], [place], [place] [address] [phone] https://www.self-help.org/locations/[place]-branch **Offers credit-building and banking services to people unable to use traditional banking   Housing Resources [facility]  Housing Authority- [place] [facility] [address] Cinda Quest [place], [place] [address] [phone] ChatRepair.pl   [facility] [address], [place], [place] [address] [phone]  WrestlingMonthly.pl **housing applications/recertification, emergency and transitional housing  Open [employer] of [employer] [address], [place], [place] [address] [phone] www.odm-hp.org  **emergency and permanent housing; rent/mortgage payment assistance  Habitat for [employer], [place] and [place] [address], [place], [place] [address] [phone] https://russell-walls.com/  Family Services of the [place], [employer] [address] [place], [place], [place] [address] www.familyservice-piedmont.org **emergency shelter for victims of domestic violence and sexual assault  [facility] [address] [place], [place] [address] [phone] www.senior-resources-guilford.org **Home expense assistance/repairs for older adults  Housing Resources [facility] [address]  [place], [place] [address] [phone] http://cohen-reilly.biz/  **May offer help with minor housing repairs  [facility] [address] [place], [place] [address] [phone] **emergency housing for victims of domestic violence  Housing Resources [place], [place], [place] [facility])  [facility] [address] [place], [place] [address] [phone] www.newrha.[facility] [address] [place], [place] [address] [phone]  [facility] [address], [place], [place] [address] [phone] www.co.[place].Boscobel.us **Housing applications/recertification; tax payment relief/exemption w specific qualifications  [facility] Help for Homeless [address], [place], [place] [address] [phone] Http://www.rchelpforhomeless.org/HOME_PAGE.html  HELP, Incorporated [facility] [address] [place] [address] [place], [place] [address] [phone] 24 Hour Crisis Line 9198514926 Hours of Operation: Monday-Friday, 8:30am-5:00pm http://helpincorporated.org **Includes emergency housing for victims of domestic violence

## 2022-07-30 NOTE — BH Specialist Note (Deleted)
Pt currently in ED at hospital; Left HIPPA-compliant message to call back Asher Muir from Center for [employer] at [facility] for Women at  [phone] [facility] office); Will call pt again tomorrow to reschedule.

## 2022-08-10 ENCOUNTER — Encounter: Payer: Medicaid Other | Admitting: Clinical

## 2022-08-10 ENCOUNTER — Other Ambulatory Visit: Payer: Self-pay

## 2022-08-10 ENCOUNTER — Emergency Department (HOSPITAL_BASED_OUTPATIENT_CLINIC_OR_DEPARTMENT_OTHER)
Admission: EM | Admit: 2022-08-10 | Discharge: 2022-08-10 | Disposition: A | Discharge status: Home / Self Care | Payer: Medicaid Other | Source: Home / Self Care | Attending: Emergency Medicine | Admitting: Emergency Medicine

## 2022-08-10 ENCOUNTER — Emergency Department (HOSPITAL_BASED_OUTPATIENT_CLINIC_OR_DEPARTMENT_OTHER): Payer: Medicaid Other

## 2022-08-10 DIAGNOSIS — D259 Leiomyoma of uterus, unspecified: Secondary | ICD-10-CM | POA: Diagnosis not present

## 2022-08-10 DIAGNOSIS — K429 Umbilical hernia without obstruction or gangrene: Secondary | ICD-10-CM | POA: Diagnosis not present

## 2022-08-10 DIAGNOSIS — R109 Unspecified abdominal pain: Secondary | ICD-10-CM | POA: Insufficient documentation

## 2022-08-10 DIAGNOSIS — D219 Benign neoplasm of connective and other soft tissue, unspecified: Secondary | ICD-10-CM

## 2022-08-10 DIAGNOSIS — R1030 Lower abdominal pain, unspecified: Secondary | ICD-10-CM | POA: Diagnosis not present

## 2022-08-10 DIAGNOSIS — R102 Pelvic and perineal pain: Secondary | ICD-10-CM

## 2022-08-10 DIAGNOSIS — K5730 Diverticulosis of large intestine without perforation or abscess without bleeding: Secondary | ICD-10-CM | POA: Diagnosis not present

## 2022-08-10 LAB — COMPREHENSIVE METABOLIC PANEL
ALT: 31 U/L (ref 0–44)
AST: 27 U/L (ref 15–41)
Albumin: 4.1 g/dL (ref 3.5–5.0)
Alkaline Phosphatase: 58 U/L (ref 38–126)
Anion gap: 9 (ref 5–15)
BUN: 15 mg/dL (ref 6–20)
CO2: 27 mmol/L (ref 22–32)
Calcium: 9.2 mg/dL (ref 8.9–10.3)
Chloride: 102 mmol/L (ref 98–111)
Creatinine, Ser: 0.78 mg/dL (ref 0.44–1.00)
GFR, Estimated: >60 mL/min (ref >60)
Glucose, Bld: 86 mg/dL (ref 70–99)
Potassium: 3.5 mmol/L (ref 3.5–5.1)
Sodium: 138 mmol/L (ref 135–145)
Total Bilirubin: 1.3 mg/dL — ABNORMAL HIGH (ref 0.3–1.2)
Total Protein: 7.5 g/dL (ref 6.5–8.1)

## 2022-08-10 LAB — URINALYSIS, MICROSCOPIC (REFLEX)

## 2022-08-10 LAB — CBC
HCT: 40.9 % (ref 36.0–46.0)
Hemoglobin: 13.9 g/dL (ref 12.0–15.0)
MCH: 27.4 pg (ref 26.0–34.0)
MCHC: 34 g/dL (ref 30.0–36.0)
MCV: 80.7 fL (ref 80.0–100.0)
Platelets: 346 10*3/uL (ref 150–400)
RBC: 5.07 MIL/uL (ref 3.87–5.11)
RDW: 14.4 % (ref 11.5–15.5)
WBC: 11.1 10*3/uL — ABNORMAL HIGH (ref 4.0–10.5)
nRBC: 0 % (ref 0.0–0.2)

## 2022-08-10 LAB — URINALYSIS, ROUTINE W REFLEX MICROSCOPIC
Bilirubin Urine: NEGATIVE
Glucose, UA: 100 mg/dL — AB
Ketones, ur: NEGATIVE mg/dL
Leukocytes,Ua: NEGATIVE
Nitrite: NEGATIVE
Protein, ur: NEGATIVE mg/dL
Specific Gravity, Urine: 1.02 (ref 1.005–1.030)
pH: 6 (ref 5.0–8.0)

## 2022-08-10 LAB — LIPASE, BLOOD: Lipase: 29 U/L (ref 11–51)

## 2022-08-10 LAB — PREGNANCY, URINE: Preg Test, Ur: NEGATIVE

## 2022-08-10 MED ORDER — IOHEXOL 300 MG/ML  SOLN
100.0000 mL | Freq: Once | INTRAMUSCULAR | Status: AC | PRN
  Administered 2022-08-10: 100 mL via INTRAVENOUS

## 2022-08-10 MED ORDER — IBUPROFEN 400 MG PO TABS: 400.0000 mg | ORAL_TABLET | Freq: Once | ORAL | Status: DC

## 2022-08-10 NOTE — ED Notes (Signed)
Reviewed discharge instructions with pt. Pt states understanding 

## 2022-08-10 NOTE — Discharge Instructions (Signed)
Scan was reassuring, however you do have a fibroid, that I believe is in the area where your pain is.  You should talk to your OB/GYN, regarding this.  Return to the ER if you feel like your pain is getting worse, you have any numbness, tingling down your leg, or you have intractable nausea or vomiting.

## 2022-08-10 NOTE — ED Triage Notes (Signed)
C/O lower abdominal pain "bladder pain"; denies painful urination.

## 2022-08-10 NOTE — ED Notes (Signed)
Urine specimen sent to lab

## 2022-08-10 NOTE — ED Provider Notes (Signed)
[place] [facility] Provider Note   CSN: [id number] Arrival date & time: 08/10/22  1415     History  Chief Complaint  Patient presents with   Abdominal Pain    Diane Sexton is a 52 y.o. female, history of fibroids, who presents to the ED secondary to bladder pain has been going on for the last day.  She states she woke up this morning, she had some suprapubic pain, that is a 3 out of 10.  She states it kind of just feels pressurized, and uncomfortable.  Is not have any pain with urination, no vaginal discharge, or no blood in the urine.  Reports a little bit of increased frequency and urgency today, but no other complaints.  Is not sexually active, no concern for STDs.  Denies any nausea, vomiting, fever or chills  Home Medications Prior to Admission medications   Medication Sig Start Date End Date Taking? Authorizing Provider  ibuprofen (ADVIL) 600 MG tablet Take 1 tablet (600 mg total) by mouth 3 (three) times daily. 07/20/22   Derwood Kaplan, MD  losartan (COZAAR) 25 MG tablet Take 1 tablet by mouth daily. Patient not taking: Reported on 04/22/2022    [provider]      Allergies    Oxycodone, Cyclobenzaprine, and Hydrocodone-apap-dietary prod    Review of Systems   Review of Systems  Constitutional:  Negative for fever.  Gastrointestinal:  Positive for abdominal pain.    Physical Exam Updated Vital Signs BP (!) 141/88   Pulse 66   Temp 98 F (36.7 C) (Oral)   Resp 16   Ht 5\' 5"  (1.651 m)   Wt 81.6 kg   SpO2 96%   BMI 29.95 kg/m  Physical Exam Vitals and nursing note reviewed.  Constitutional:      General: She is not in acute distress.    Appearance: She is well-developed.  HENT:     Head: Normocephalic and atraumatic.  Eyes:     Conjunctiva/sclera: Conjunctivae normal.  Cardiovascular:     Rate and Rhythm: Normal rate and regular rhythm.     Heart sounds: No murmur heard. Pulmonary:     Effort:  Pulmonary effort is normal. No respiratory distress.     Breath sounds: Normal breath sounds.  Abdominal:     Palpations: Abdomen is soft.     Tenderness: There is abdominal tenderness in the suprapubic area.  Musculoskeletal:        General: No swelling.     Cervical back: Neck supple.  Skin:    General: Skin is warm and dry.     Capillary Refill: Capillary refill takes less than 2 seconds.  Neurological:     Mental Status: She is alert.  Psychiatric:        Mood and Affect: Mood normal.     ED Results / Procedures / Treatments   Labs (all labs ordered are listed, but only abnormal results are displayed) Labs Reviewed  COMPREHENSIVE METABOLIC PANEL - Abnormal; Notable for the following components:      Result Value   Total Bilirubin 1.3 (*)    All other components within normal limits  CBC - Abnormal; Notable for the following components:   WBC 11.1 (*)    All other components within normal limits  URINALYSIS, ROUTINE W REFLEX MICROSCOPIC - Abnormal; Notable for the following components:   Glucose, UA 100 (*)    Hgb urine dipstick TRACE (*)    All other  components within normal limits  URINALYSIS, MICROSCOPIC (REFLEX) - Abnormal; Notable for the following components:   Bacteria, UA RARE (*)    All other components within normal limits  LIPASE, BLOOD  PREGNANCY, URINE    EKG None  Radiology CT ABDOMEN PELVIS W CONTRAST  Result Date: 08/10/2022 CLINICAL DATA:  Frequency and suprapubic abdominal pain EXAM: CT ABDOMEN AND PELVIS WITH CONTRAST TECHNIQUE: Multidetector CT imaging of the abdomen and pelvis was performed using the standard protocol following bolus administration of intravenous contrast. RADIATION DOSE REDUCTION: This exam was performed according to the departmental dose-optimization program which includes automated exposure control, adjustment of the mA and/or kV according to patient size and/or use of iterative reconstruction technique. CONTRAST:   OMNIPAQUE IOHEXOL 300 MG/ML  SOLN COMPARISON:  None Available. FINDINGS: Lower chest:  No contributory findings. Hepatobiliary: No focal liver abnormality.No evidence of biliary obstruction or stone. Pancreas: Unremarkable. Spleen: Unremarkable. Adrenals/Urinary Tract: Negative adrenals. No hydronephrosis or stone. Unremarkable bladder. Stomach/Bowel: No obstruction. Appendectomy. Mild colonic diverticulosis. Vascular/Lymphatic: No acute vascular abnormality. No mass or adenopathy. Fatty umbilical hernia. Reproductive:Multiple uterine fibroids including an exophytic ventral heavily calcified fibroid measuring 6.4 cm. Other: No ascites or pneumoperitoneum. Musculoskeletal: No acute abnormalities. Degeneration with slight L4-5 anterolisthesis. IMPRESSION: 1. No acute finding. 2. Fibroid uterus, Apurva Reily fatty umbilical hernia, and colonic diverticulosis. Electronically Signed   By: Tiburcio Pea M.D.   On: 08/10/2022 18:59    Procedures Procedures    Medications Ordered in ED Medications  ibuprofen (ADVIL) tablet 400 mg (400 mg Oral Not Given 08/10/22 1903)  iohexol (OMNIPAQUE) 300 MG/ML solution 100 mL (100 mLs Intravenous Contrast Given 08/10/22 1809)    ED Course/ Medical Decision Making/ A&P                             Medical Decision Making Patient is a 52 year old female, here for suprapubic pain, has been going on since this morning.  Denies any urinary pain, or vaginal discharge.  No concern for STDs.  Will obtain urinalysis, blood work for further evaluation.  Amount and/or Complexity of Data Reviewed Labs:     Details: Urinalysis unremarkable, blood work reassuring Radiology: ordered.    Details: CT abdomen pelvis, shows fibroids, Kirstein Baxley fatty umbilical hernia Discussion of management or test interpretation with external provider(s): Discussed with patient, CT abdomen pelvis shows fibroids, 1 which was ventral, exophytic, this may be in the area where the bladder is, causing some of pain  discomfort, I encouraged her to follow-up with OB/GYN regarding this, she may need to have a hysterectomy if persistently uncomfortable.  Additionally we discussed return precautions and she voiced understanding  Risk Prescription drug management.    Final Clinical Impression(s) / ED Diagnoses Final diagnoses:  Suprapubic pain  Fibroid    Rx / DC Orders ED Discharge Orders     None         Sayde Lish, [place]ley [place], PA 08/10/22 1916    Melene Plan, DO 08/10/22 2004

## 2022-08-10 NOTE — ED Notes (Signed)
Patient transported to CT 

## 2022-10-08 ENCOUNTER — Ambulatory Visit (INDEPENDENT_AMBULATORY_CARE_PROVIDER_SITE_OTHER): Payer: Medicaid Other | Admitting: Mental Health

## 2022-10-08 ENCOUNTER — Encounter (HOSPITAL_COMMUNITY): Payer: Self-pay

## 2022-10-08 DIAGNOSIS — F322 Major depressive disorder, single episode, severe without psychotic features: Secondary | ICD-10-CM

## 2022-10-08 DIAGNOSIS — F411 Generalized anxiety disorder: Secondary | ICD-10-CM | POA: Insufficient documentation
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
## 2022-10-19 ENCOUNTER — Ambulatory Visit (HOSPITAL_COMMUNITY): Payer: Medicaid Other | Admitting: Physician Assistant

## 2022-10-28 ENCOUNTER — Ambulatory Visit (INDEPENDENT_AMBULATORY_CARE_PROVIDER_SITE_OTHER): Payer: Medicaid Other | Admitting: Student

## 2022-10-28 VITALS — BP 148/85 | HR 81 | Ht 65.0 in | Wt 184.8 lb

## 2022-10-28 DIAGNOSIS — F411 Generalized anxiety disorder: Secondary | ICD-10-CM

## 2022-10-28 DIAGNOSIS — Z59819 Housing instability, housed unspecified: Secondary | ICD-10-CM | POA: Diagnosis not present

## 2022-10-28 DIAGNOSIS — F331 Major depressive disorder, recurrent, moderate: Secondary | ICD-10-CM

## 2022-10-28 DIAGNOSIS — Z780 Asymptomatic menopausal state: Secondary | ICD-10-CM | POA: Diagnosis not present

## 2022-10-28 DIAGNOSIS — Z5941 Food insecurity: Secondary | ICD-10-CM | POA: Diagnosis not present

## 2022-10-28 MED ORDER — SERTRALINE HCL 50 MG PO TABS: 50.0000 mg | ORAL_TABLET | Freq: Every day | ORAL | 1 refills | Status: DC
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
## 2022-11-01 ENCOUNTER — Encounter (HOSPITAL_COMMUNITY): Payer: Self-pay | Admitting: Student

## 2022-11-01 DIAGNOSIS — Z59819 Housing instability, housed unspecified: Secondary | ICD-10-CM | POA: Insufficient documentation

## 2022-11-01 DIAGNOSIS — F331 Major depressive disorder, recurrent, moderate: Secondary | ICD-10-CM | POA: Insufficient documentation

## 2022-11-01 DIAGNOSIS — Z5941 Food insecurity: Secondary | ICD-10-CM | POA: Insufficient documentation

## 2022-11-03 ENCOUNTER — Other Ambulatory Visit: Payer: No Typology Code available for payment source | Admitting: Licensed Clinical Social Worker

## 2022-11-03 NOTE — Patient Outreach (Signed)
  Medicaid Managed Care   Unsuccessful Attempt Note   11/03/2022 Name: Diane Sexton MRN: [id number] DOB: 12/01/1970  Referred by: [facility] Reason for referral : No chief complaint on file.   An unsuccessful telephone outreach was attempted today. The patient was referred to the case management team for assistance with care management and care coordination.    Follow Up Plan: The Managed Medicaid care management team will reach out to the patient again over the next 30 days.   Dickie La, BSW, MSW, [employer] Managed Medicaid LCSW [facility]  [facility] [email or web address] Phone: [phone]

## 2022-11-03 NOTE — Patient Instructions (Signed)
Assunta Found ,   The [facility] Managed Care Team is available to provide assistance to you with your healthcare needs at no cost and as a benefit of your [facility] Health plan. I'm sorry I was unable to reach you today for our scheduled appointment. Our care guide will call you to reschedule our telephone appointment. Please call me at the number below. I am available to be of assistance to you regarding your healthcare needs. .   Thank you,   Dickie La, BSW, MSW, LCSW Managed Medicaid LCSW [facility]  [address] [email or web address] Phone: [phone]

## 2022-11-05 ENCOUNTER — Other Ambulatory Visit: Payer: Medicaid Other

## 2022-11-05 NOTE — Patient Instructions (Signed)
Visit Information  Ms. Bensen was given information about Medicaid Managed Care team care coordination services as a part of their [facility] Medicaid benefit. Bree L Moroney verbally consented to engagement with the [facility] Managed Care team.   If you are experiencing a medical emergency, please call 911 or report to your local emergency department or urgent care.   If you have a non-emergency medical problem during routine business hours, please contact your provider's office and ask to speak with a nurse.   For questions related to your [facility], please call: [phone] or visit the homepage here: [email or web address]  If you would like to schedule transportation through your [facility], please call the following number at least 2 days in advance of your appointment: [phone]   Rides for urgent appointments can also be made after hours by calling Member Services.  Call the Behavioral Health Crisis Line at [phone], at any time, 24 hours a day, 7 days a week. If you are in danger or need immediate medical attention call 911.  If you would like help to quit smoking, call 1-800-QUIT-NOW (732-581-7670) OR Espaol: 1-855-Djelo-Ya (1-324-401-0272) o para ms informacin haga clic aqu or Text READY to 536-644 to register via text  Ms. Pandolfi - following are the goals we discussed in your visit today:   Goals Addressed   None      The  Patient                                              has been provided with contact information for the Managed Medicaid care management team and has been advised to call with any health related questions or concerns.   Gus Puma, Kenard Gower, MHA [facility]  Managed Medicaid Social Worker [phone]   Following is a copy of your plan of care:  There are no care plans that you recently modified to  display for this patient.

## 2022-11-05 NOTE — Patient Outreach (Signed)
  Medicaid Managed Care Social Work Note  11/05/2022 Name:  Diane Sexton MRN:  [id number] DOB:  10/13/1970  Diane Sexton is an 52 y.o. year old female who is a primary patient of [facility].  The Medicaid Managed Care Coordination team was consulted for assistance with:  Community Resources   Diane Sexton was given information about Medicaid Managed Care Coordination team services today. Diane Sexton Patient agreed to services and verbal consent obtained.  Engaged with patient  for by telephone forinitial visit in response to referral for case management and/or care coordination services.   Assessments/Interventions:  Review of past medical history, allergies, medications, health status, including review of consultants reports, laboratory and other test data, was performed as part of comprehensive evaluation and provision of chronic care management services.  SDOH: (Social Determinant of Health) assessments and interventions performed: BSW completed a telephone outreach with patient, patient states no resources are needed at this time. BSW provided patient with contact information for any future needs.   Advanced Directives Status:  Not addressed in this encounter.  Care Plan                 Allergies  Allergen Reactions   Oxycodone Other (See Comments)    Upset stomach   Cyclobenzaprine Nausea Only   Hydrocodone-Apap-Dietary Prod Nausea And Vomiting    Medications Reviewed Today   Medications were not reviewed in this encounter     Patient Active Problem List   Diagnosis Date Noted   MDD (major depressive disorder), recurrent episode, moderate (HCC) 11/01/2022   Housing instability 11/01/2022   Food insecurity 11/01/2022   Severe major depression without psychotic features (HCC) 10/08/2022   Generalized anxiety disorder 10/08/2022   Hypertension 04/22/2022   Pelvic pain in female 04/22/2022   Fibroid uterus 04/22/2022   History of  abnormal cervical Pap smear 03/05/2020   Well woman exam with routine gynecological exam 12/14/2018   Bacterial vaginitis 08/25/2012   Essential hypertension, benign 05/11/2012   Overweight (BMI 25.0-29.9) 05/11/2012   Obesity 04/05/2011   Chest pain, atypical 11/18/2010   Fibroids 09/18/2010   HYPERLIPIDEMIA 06/09/2009   Backache 04/28/2009    Conditions to be addressed/monitored per PCP order:   community resources  There are no care plans that you recently modified to display for this patient.   Follow up:  Patient requests no follow-up at this time.  Plan: The  Patient has been provided with contact information for the Managed Medicaid care management team and has been advised to call with any health related questions or concerns.    Abelino Derrick, MHA [facility]  Managed [facility] Social Worker [phone]

## 2022-11-11 ENCOUNTER — Ambulatory Visit (HOSPITAL_COMMUNITY): Payer: Medicaid Other | Admitting: Student

## 2022-11-15 ENCOUNTER — Emergency Department (HOSPITAL_BASED_OUTPATIENT_CLINIC_OR_DEPARTMENT_OTHER)
Admission: EM | Admit: 2022-11-15 | Discharge: 2022-11-15 | Disposition: A | Discharge status: Home / Self Care | Payer: Medicaid Other | Attending: Emergency Medicine | Admitting: Emergency Medicine

## 2022-11-15 ENCOUNTER — Emergency Department (HOSPITAL_BASED_OUTPATIENT_CLINIC_OR_DEPARTMENT_OTHER): Payer: Medicaid Other

## 2022-11-15 ENCOUNTER — Other Ambulatory Visit: Payer: Self-pay

## 2022-11-15 ENCOUNTER — Encounter (HOSPITAL_BASED_OUTPATIENT_CLINIC_OR_DEPARTMENT_OTHER): Payer: Self-pay

## 2022-11-15 DIAGNOSIS — R079 Chest pain, unspecified: Secondary | ICD-10-CM

## 2022-11-15 DIAGNOSIS — R0789 Other chest pain: Secondary | ICD-10-CM | POA: Insufficient documentation

## 2022-11-15 LAB — CBC
HCT: 41 % (ref 36.0–46.0)
Hemoglobin: 13.8 g/dL (ref 12.0–15.0)
MCH: 27 pg (ref 26.0–34.0)
MCHC: 33.7 g/dL (ref 30.0–36.0)
MCV: 80.1 fL (ref 80.0–100.0)
Platelets: 336 10*3/uL (ref 150–400)
RBC: 5.12 MIL/uL — ABNORMAL HIGH (ref 3.87–5.11)
RDW: 14.4 % (ref 11.5–15.5)
WBC: 12.3 10*3/uL — ABNORMAL HIGH (ref 4.0–10.5)
nRBC: 0 % (ref 0.0–0.2)

## 2022-11-15 LAB — BASIC METABOLIC PANEL
Anion gap: 13 (ref 5–15)
BUN: 11 mg/dL (ref 6–20)
CO2: 26 mmol/L (ref 22–32)
Calcium: 8.7 mg/dL — ABNORMAL LOW (ref 8.9–10.3)
Chloride: 99 mmol/L (ref 98–111)
Creatinine, Ser: 0.82 mg/dL (ref 0.44–1.00)
GFR, Estimated: >60 mL/min (ref >60)
Glucose, Bld: 136 mg/dL — ABNORMAL HIGH (ref 70–99)
Potassium: 3 mmol/L — ABNORMAL LOW (ref 3.5–5.1)
Sodium: 138 mmol/L (ref 135–145)

## 2022-11-15 LAB — TROPONIN I (HIGH SENSITIVITY): Troponin I (High Sensitivity): 3 ng/L (ref <18)

## 2022-11-15 NOTE — ED Triage Notes (Signed)
Pt reports intermittent sharp CP that has been ongoing for 6 months. Pt reports it is tingling in nature and sharp. Pt states she wanted to get seen today, but there has been no change to the pain. Pain does not radiate anywhere. Pt denies SOB, N/V/D. Hx of HTN, prediabetic.

## 2022-11-15 NOTE — Discharge Instructions (Addendum)
Try pepcid or tagamet up to twice a day.  Try to avoid things that may make this worse, most commonly these are spicy foods tomato based products fatty foods chocolate and peppermint.  Alcohol and tobacco can also make this worse.  Return to the emergency department for sudden worsening pain fever or inability to eat or drink. Your potassium was a little bit low.  Please try to eat something high in potassium with each meal.  Your calcium was also a bit low.  Please mention this to your family doctor they may want to recheck it in a few weeks.

## 2022-11-15 NOTE — ED Provider Notes (Signed)
[place] [facility] Provider Note   CSN: [id number] Arrival date & time: 11/15/22  1935     History  Chief Complaint  Patient presents with   Chest Pain    Diane Sexton is a 52 y.o. female.  52 yo F with a chief complaint of left-sided chest discomfort.  This has been off and on for about 6 months.  Seems to come and go at random.  Last for seconds at a time.  We seems to be left-sided.  Had an event today.  Has resolved.  She denies anything else new.  Denies cough congestion or fever.  Denies trauma.   Chest Pain      Home Medications Prior to Admission medications   Medication Sig Start Date End Date Taking? Authorizing Provider  ibuprofen (ADVIL) 600 MG tablet Take 1 tablet (600 mg total) by mouth 3 (three) times daily. 07/20/22   Derwood Kaplan, MD  lisinopril (ZESTRIL) 10 MG tablet Take 10 mg by mouth daily. 09/21/22   [provider]  sertraline (ZOLOFT) 50 MG tablet Take 1 tablet (50 mg total) by mouth daily. 10/28/22 12/27/22  Lamar Sprinkles, MD      Allergies    Oxycodone, Cyclobenzaprine, and Hydrocodone-apap-dietary prod    Review of Systems   Review of Systems  Cardiovascular:  Positive for chest pain.    Physical Exam Updated Vital Signs BP (!) 162/94   Pulse 95   Temp 98.1 F (36.7 C) (Oral)   Resp 18   Ht 5\' 5"  (1.651 m)   Wt 81.6 kg   LMP 11/07/2019   SpO2 97%   BMI 29.95 kg/m  Physical Exam Vitals and nursing note reviewed.  Constitutional:      General: She is not in acute distress.    Appearance: She is well-developed. She is not diaphoretic.  HENT:     Head: Normocephalic and atraumatic.  Eyes:     Pupils: Pupils are equal, round, and reactive to light.  Cardiovascular:     Rate and Rhythm: Normal rate and regular rhythm.     Heart sounds: No murmur heard.    No friction rub. No gallop.  Pulmonary:     Effort: Pulmonary effort is normal.     Breath sounds: No wheezing or  rales.  Abdominal:     General: There is no distension.     Palpations: Abdomen is soft.     Tenderness: There is no abdominal tenderness.  Musculoskeletal:        General: No tenderness.     Cervical back: Normal range of motion and neck supple.  Skin:    General: Skin is warm and dry.  Neurological:     Mental Status: She is alert and oriented to person, place, and time.  Psychiatric:        Behavior: Behavior normal.     ED Results / Procedures / Treatments   Labs (all labs ordered are listed, but only abnormal results are displayed) Labs Reviewed  BASIC METABOLIC PANEL - Abnormal; Notable for the following components:      Result Value   Potassium 3.0 (*)    Glucose, Bld 136 (*)    Calcium 8.7 (*)    All other components within normal limits  CBC - Abnormal; Notable for the following components:   WBC 12.3 (*)    RBC 5.12 (*)    All other components within normal limits  TROPONIN I (HIGH SENSITIVITY)  EKG None  Radiology No results found.  Procedures Procedures    Medications Ordered in ED Medications - No data to display  ED Course/ Medical Decision Making/ A&P                                 Medical Decision Making Amount and/or Complexity of Data Reviewed Labs: ordered. Radiology: ordered.   52 yo F with a chief complaints of left-sided chest discomfort.  Very atypical by history.  Lasting for seconds at a time and then resolving.  She describes it as like a shocklike sensation.  Could be neuropathic.  Could be musculoskeletal.  Will treat for possible reflux.  She has a low potassium low calcium level.  Mild leukocytosis but appears to be close to her baseline.  No anemia.  Troponin negative.  Had symptoms greater than 6 hours without significant change.  So atypical from ACS I do not feel like a second needs to be obtained.  Chest x-ray independently interpreted by me without focal pneumothorax.  8:53 PM:  I have discussed the  diagnosis/risks/treatment options with the patient.  Evaluation and diagnostic testing in the emergency department does not suggest an emergent condition requiring admission or immediate intervention beyond what has been performed at this time.  They will follow up with PCP. We also discussed returning to the ED immediately if new or worsening sx occur. We discussed the sx which are most concerning (e.g., sudden worsening pain, fever, inability to tolerate by mouth) that necessitate immediate return. Medications administered to the patient during their visit and any new prescriptions provided to the patient are listed below.  Medications given during this visit Medications - No data to display   The patient appears reasonably screen and/or stabilized for discharge and I doubt any other medical condition or other [facility] requiring further screening, evaluation, or treatment in the ED at this time prior to discharge.          Final Clinical Impression(s) / ED Diagnoses Final diagnoses:  Nonspecific chest pain    Rx / DC Orders ED Discharge Orders     None         Melene Plan, DO 11/15/22 2054

## 2022-11-17 ENCOUNTER — Ambulatory Visit: Payer: No Typology Code available for payment source | Admitting: Physician Assistant

## 2022-11-19 ENCOUNTER — Ambulatory Visit (INDEPENDENT_AMBULATORY_CARE_PROVIDER_SITE_OTHER): Payer: Medicaid Other | Admitting: Mental Health

## 2022-11-19 ENCOUNTER — Encounter (HOSPITAL_COMMUNITY): Payer: Self-pay

## 2022-11-19 DIAGNOSIS — F331 Major depressive disorder, recurrent, moderate: Secondary | ICD-10-CM

## 2022-11-19 DIAGNOSIS — F411 Generalized anxiety disorder: Secondary | ICD-10-CM

## 2022-11-19 NOTE — Progress Notes (Unsigned)
THERAPIST PROGRESS NOTE Virtual Visit via Video Note  I connected with Rhylan L Shakoor on 11/19/22 at 10:00 AM EDT by a video enabled telemedicine application and verified that I am speaking with the correct person using two identifiers.  Location: Patient: Mother's home [place] Provider: home office   I discussed the limitations of evaluation and management by telemedicine and the availability of in person appointments. The patient expressed understanding and agreed to proceed.   I discussed the assessment and treatment plan with the patient. The patient was provided an opportunity to ask questions and all were answered. The patient agreed with the plan and demonstrated an understanding of the instructions.   The patient was advised to call back or seek an in-person evaluation if the symptoms worsen or if the condition fails to improve as anticipated.  I provided 50 minutes of non-face-to-face time during this encounter.   Dorris Singh, [facility]   Session Time: 10:03 am (50 minutes)  Participation Level: Active  Behavioral Response: CasualAlertDepressed and Dysphoric  Type of Therapy: Individual Therapy  Treatment Goals addressed: STG: Nuriya will increase management of depression AEB development of effective copings skills with ability to make needed behavioral changes within the next 90   ProgressTowards Goals: Initial  Interventions: Solution Focused and Supportive  Summary: DAMITA CIACCIA is a 52 y.o. female who presents with MDD recurrent moderate and GAD. Presents for session alert and oriented; mood and affect anxious irritable. 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 Notes to lack gas and lack a place to stay when she gets there. Denies for family desire to help her and to have borrowed money from family members several times doubt for their desire to help her. Shares  thoughts of relationship with father prior to his passing and shares for him to have always been helpful towards her in adulthood. Engaged in exploring options with therapist and does not feel she can secure funds from family. Denies desire to remain in [place], noting poor relationship with mother as well as feeling as if [place] is toxic for her. Resistant to therapist encouragement and working to explore additional options to be ready for next opportunity. Agrees to work on Insurance account manager of moods and unclear of desire to continue therapy services or not. Agrees to follow up. Denies safety concerns.    Suicidal/Homicidal: Nowithout intent/plan  Therapist Response: Therapist engaged Pt in tele-therapy session. Assessed for current location and presence of confidential space. Reviewed intake assessment and provided informed consent and educated on bounds of confidentiality. Provided safe space for Nalini to share thoughts and concerns in regards to stressors. Provided support and encouragement; validated feelings. Engaged in guided discovery to explore options to gain funds needed or employment. Explored ability to make additional income independently. Supported in exploring options to secure position in area to support in being ready for next opportunity. Inially receptive then becoming irritable. Assessed for safety concerns and provided crisis resources in the area. Reviewed session, discussed treatment plan and provided follow up.   Plan: Return again in x6 weeks.  Diagnosis: MDD (major depressive disorder), recurrent episode, moderate (HCC)  GAD (generalized anxiety disorder)  Collaboration of Care: Other None  Patient/Guardian was advised Release of Information must be obtained prior to any record release in order to collaborate their care with an outside provider. Patient/Guardian was advised if they have not already  done so to contact the registration department to sign all necessary forms in  order for [place] to release information regarding their care.   Consent: Patient/Guardian gives verbal consent for treatment and assignment of benefits for services provided during this visit. Patient/Guardian expressed understanding and agreed to proceed.   Stephan Minister [place], [facility] 11/19/2022

## 2022-11-23 NOTE — Progress Notes (Deleted)
New patient visit   Patient: Diane Sexton   DOB: 09-24-1970   52 y.o. Female  MRN: [id number] Visit Date: 11/24/2022  Today's healthcare provider: Alfredia Ferguson, PA-C   No chief complaint on file.  Subjective    Diane Sexton is a 52 y.o. female who presents today as a new patient to establish care.   Pt was recently seen in the ED 10/14 for chest pain, troponin and chest xray normal. Pain had resolved in ED. No EKG was completed. Potassium was notably a 3.0. No supplementation given.  Past Medical History:  Diagnosis Date   Constipation    Depression    bi-polar   DUB (dysfunctional uterine bleeding)    Fibroids    History of trichomoniasis 2018   Hyperlipemia    Hypertension    PMDD (premenstrual dysphoric disorder)    Past Surgical History:  Procedure Laterality Date   APPENDECTOMY  2009   [place]  2008   Family Status  Relation Name Status   Mother  Alive   Father  Alive   Sister  Alive   Brother  Alive   Pat Aunt  Deceased   Pat Aunt  Deceased   Nutritional therapist  (Not Specified)   MGM  Deceased   Nutritional therapist  (Not Specified)  No partnership data on file   Family History  Problem Relation Age of Onset   Hypertension Mother    Neuropathy Mother    Hypertension Father    Diabetes Father    Diabetes Mellitus II Father    Diabetes Mellitus II Paternal Aunt    Diabetes Mellitus II Paternal Aunt    Diabetes Mellitus II Paternal Uncle    Dementia Maternal Grandmother    Heart attack Maternal Grandmother 48   Diabetes Mellitus II Paternal Uncle    Social History   Socioeconomic History   Marital status: Single    Spouse name: Not on file   Number of children: 0   Years of education: Not on file   Highest education level: Some college, no degree  Occupational History   Occupation: Agricultural engineer: [employer]  Tobacco Use   Smoking status: Never   Smokeless tobacco: Never  Vaping Use   Vaping status: Never Used   Substance and Sexual Activity   Alcohol use: Yes    Alcohol/week: 1.0 standard drink of alcohol    Types: 1 Standard drinks or equivalent per week    Comment: occasionally   Drug use: No   Sexual activity: Not Currently    Partners: Female, Female    Birth control/protection: None  Other Topics Concern   Not on file  Social History Narrative   Not on file   Social Determinants of Health   Financial Resource Strain: High Risk (10/08/2022)   Overall Financial Resource Strain (CARDIA)    Difficulty of Paying Living Expenses: Very hard  Food Insecurity: Not on File (10/28/2022)   Received from [facility] Insecurity    Food: 0  Transportation Needs: No Transportation Needs (02/25/2022)   PRAPARE - Administrator, Civil Service (Medical): No    Lack of Transportation (Non-Medical): No  Physical Activity: Inactive (10/08/2022)   Exercise Vital Sign    Days of Exercise per Week: 0 days    Minutes of Exercise per Session: 0 min  Stress: Stress Concern Present (10/08/2022)   [employer] of Occupational Health - Occupational Stress Questionnaire  Feeling of Stress : Very much  Social Connections: Not on File (10/21/2022)   Received from [facility]   Social Connections    Connectedness: 0  Recent Concern: Social Connections - Moderately Isolated (10/08/2022)   Social Connection and Isolation Panel [NHANES]    Frequency of Communication with Friends and Family: More than three times a week    Frequency of Social Gatherings with Friends and Family: More than three times a week    Attends Religious Services: More than 4 times per year    Active Member of [employer] or Organizations: No    Attends Banker Meetings: Never    Marital Status: Never married   Outpatient Medications Prior to Visit  Medication Sig   ibuprofen (ADVIL) 600 MG tablet Take 1 tablet (600 mg total) by mouth 3 (three) times daily.   lisinopril (ZESTRIL) 10 MG tablet Take 10 mg by mouth daily.    sertraline (ZOLOFT) 50 MG tablet Take 1 tablet (50 mg total) by mouth daily.   No facility-administered medications prior to visit.   Allergies  Allergen Reactions   Oxycodone Other (See Comments)    Upset stomach   Cyclobenzaprine Nausea Only   Hydrocodone-Apap-Dietary Prod Nausea And Vomiting    Immunization History  Administered Date(s) Administered   PFIZER(Purple Top)SARS-COV-2 Vaccination 07/19/2019, 08/07/2019    Health Maintenance  Topic Date Due   DTaP/Tdap/Td (1 - Tdap) Never done   Colonoscopy  Never done   Zoster Vaccines- Shingrix (1 of 2) Never done   INFLUENZA VACCINE  09/02/2022   COVID-19 Vaccine (3 - 2023-24 season) 10/03/2022   MAMMOGRAM  02/26/2024   Cervical Cancer Screening (HPV/Pap Cotest)  01/12/2027   Hepatitis C Screening  Completed   HIV Screening  Completed   HPV VACCINES  Aged Out    Patient Care Team: [facility] as PCP - General  Review of Systems  {Insert previous labs (optional):23779} {See past labs  Heme  Chem  Endocrine  Serology  Results Review (optional):1}   Objective    LMP 11/07/2019  {Insert last BP/Wt (optional):23777}{See vitals history (optional):1}   Physical Exam ***  Depression Screen    10/08/2022    8:24 AM 06/07/2022    1:32 PM 03/05/2020    2:43 PM  PHQ 2/9 Scores  PHQ - 2 Score  3 0  PHQ- 9 Score  16 9     Information is confidential and restricted. Go to Review Flowsheets to unlock data.   No results found for any visits on 11/24/22.  Assessment & Plan     There are no diagnoses linked to this encounter.   No follow-ups on file.      Alfredia Ferguson, PA-C  [facility] at [facility] [phone] (phone) [phone] (fax)  [facility]

## 2022-11-24 ENCOUNTER — Ambulatory Visit: Payer: No Typology Code available for payment source | Admitting: Physician Assistant

## 2022-11-26 ENCOUNTER — Ambulatory Visit (HOSPITAL_COMMUNITY): Payer: Medicaid Other | Admitting: Student

## 2022-12-02 ENCOUNTER — Encounter (HOSPITAL_COMMUNITY): Payer: Medicaid Other | Admitting: Student

## 2022-12-08 ENCOUNTER — Encounter (HOSPITAL_COMMUNITY): Payer: Self-pay

## 2022-12-08 ENCOUNTER — Ambulatory Visit (HOSPITAL_COMMUNITY): Payer: Medicaid Other | Admitting: Mental Health

## 2023-01-08 ENCOUNTER — Encounter (HOSPITAL_BASED_OUTPATIENT_CLINIC_OR_DEPARTMENT_OTHER): Payer: Self-pay

## 2023-01-08 ENCOUNTER — Emergency Department (HOSPITAL_BASED_OUTPATIENT_CLINIC_OR_DEPARTMENT_OTHER): Payer: Medicaid Other

## 2023-01-08 ENCOUNTER — Emergency Department (HOSPITAL_BASED_OUTPATIENT_CLINIC_OR_DEPARTMENT_OTHER)
Admission: EM | Admit: 2023-01-08 | Discharge: 2023-01-08 | Disposition: A | Discharge status: Home / Self Care | Payer: Medicaid Other | Attending: Emergency Medicine | Admitting: Emergency Medicine

## 2023-01-08 ENCOUNTER — Telehealth: Payer: No Typology Code available for payment source

## 2023-01-08 DIAGNOSIS — M549 Dorsalgia, unspecified: Secondary | ICD-10-CM | POA: Diagnosis present

## 2023-01-08 DIAGNOSIS — M545 Other chronic pain: Secondary | ICD-10-CM

## 2023-01-08 DIAGNOSIS — I10 Essential (primary) hypertension: Secondary | ICD-10-CM | POA: Diagnosis not present

## 2023-01-08 DIAGNOSIS — Z79899 Other long term (current) drug therapy: Secondary | ICD-10-CM | POA: Insufficient documentation

## 2023-01-08 MED ORDER — ACETAMINOPHEN 500 MG PO TABS
1000.0000 mg | ORAL_TABLET | Freq: Once | ORAL | Status: AC
  Administered 2023-01-08: 1000 mg via ORAL
  Filled 2023-01-08: qty 2
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
## 2023-01-08 NOTE — ED Triage Notes (Signed)
Pt states that she has had back pain for a long time. States that this morning the left lower back is giving her a hard time.

## 2023-01-08 NOTE — Discharge Instructions (Signed)
Your x-ray that was done today was normal.  Like we discussed, I would like you to begin taking Tylenol, 1000 mg every 8 hours.  If it does not upset your stomach too much you can take 400 mg of ibuprofen every 6 hours.  Please follow-up with your primary care doctor to discuss physical therapy.  Including your discharge paperwork is a telephone number for an orthopedic center.  You can call them and make an appointment to discuss your pain.

## 2023-01-08 NOTE — ED Notes (Signed)
ED Provider at bedside. 

## 2023-01-08 NOTE — ED Notes (Signed)
Patient transported to X-ray 

## 2023-01-09 ENCOUNTER — Telehealth: Payer: Medicaid Other | Admitting: Family Medicine

## 2023-01-09 DIAGNOSIS — M545 Low back pain, unspecified: Secondary | ICD-10-CM

## 2023-01-09 MED ORDER — BACLOFEN 10 MG PO TABS: 10.0000 mg | ORAL_TABLET | Freq: Three times a day (TID) | ORAL | 0 refills | Status: DC
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
## 2023-01-09 NOTE — Patient Instructions (Signed)
Low Back Sprain or Strain Rehab Ask your health care provider which exercises are safe for you. Do exercises exactly as told by your health care provider and adjust them as directed. It is normal to feel mild stretching, pulling, tightness, or discomfort as you do these exercises. Stop right away if you feel sudden pain or your pain gets worse. Do not begin these exercises until told by your health care provider. Stretching and range-of-motion exercises These exercises warm up your muscles and joints and improve the movement and flexibility of your back. These exercises also help to relieve pain, numbness, and tingling. Lumbar rotation  Lie on your back on a firm bed or the floor with your knees bent. Straighten your arms out to your sides so each arm forms a 90-degree angle (right angle) with a side of your body. Slowly move (rotate) both of your knees to one side of your body until you feel a stretch in your lower back (lumbar). Try not to let your shoulders lift off the floor. Hold this position for __________ seconds. Tense your abdominal muscles and slowly move your knees back to the starting position. Repeat this exercise on the other side of your body. Repeat __________ times. Complete this exercise __________ times a day. Single knee to chest  Lie on your back on a firm bed or the floor with both legs straight. Bend one of your knees. Use your hands to move your knee up toward your chest until you feel a gentle stretch in your lower back and buttock. Hold your leg in this position by holding on to the front of your knee. Keep your other leg as straight as possible. Hold this position for __________ seconds. Slowly return to the starting position. Repeat with your other leg. Repeat __________ times. Complete this exercise __________ times a day. Prone extension on elbows  Lie on your abdomen on a firm bed or the floor (prone position). Prop yourself up on your elbows. Use your arms  to help lift your chest up until you feel a gentle stretch in your abdomen and your lower back. This will place some of your body weight on your elbows. If this is uncomfortable, try stacking pillows under your chest. Your hips should stay down, against the surface that you are lying on. Keep your hip and back muscles relaxed. Hold this position for __________ seconds. Slowly relax your upper body and return to the starting position. Repeat __________ times. Complete this exercise __________ times a day. Strengthening exercises These exercises build strength and endurance in your back. Endurance is the ability to use your muscles for a long time, even after they get tired. Pelvic tilt This exercise strengthens the muscles that lie deep in the abdomen. Lie on your back on a firm bed or the floor with your legs extended. Bend your knees so they are pointing toward the ceiling and your feet are flat on the floor. Tighten your lower abdominal muscles to press your lower back against the floor. This motion will tilt your pelvis so your tailbone points up toward the ceiling instead of pointing to your feet or the floor. To help with this exercise, you may place a small towel under your lower back and try to push your back into the towel. Hold this position for __________ seconds. Let your muscles relax completely before you repeat this exercise. Repeat __________ times. Complete this exercise __________ times a day. Alternating arm and leg raises  Get on your hands  and knees on a firm surface. If you are on a hard floor, you may want to use padding, such as an exercise mat, to cushion your knees. Line up your arms and legs. Your hands should be directly below your shoulders, and your knees should be directly below your hips. Lift your left leg behind you. At the same time, raise your right arm and straighten it in front of you. Do not lift your leg higher than your hip. Do not lift your arm higher  than your shoulder. Keep your abdominal and back muscles tight. Keep your hips facing the ground. Do not arch your back. Keep your balance carefully, and do not hold your breath. Hold this position for __________ seconds. Slowly return to the starting position. Repeat with your right leg and your left arm. Repeat __________ times. Complete this exercise __________ times a day. Abdominal set with straight leg raise  Lie on your back on a firm bed or the floor. Bend one of your knees and keep your other leg straight. Tense your abdominal muscles and lift your straight leg up, 4-6 inches (10-15 cm) off the ground. Keep your abdominal muscles tight and hold this position for __________ seconds. Do not hold your breath. Do not arch your back. Keep it flat against the ground. Keep your abdominal muscles tense as you slowly lower your leg back to the starting position. Repeat with your other leg. Repeat __________ times. Complete this exercise __________ times a day. Single leg lower with bent knees Lie on your back on a firm bed or the floor. Tense your abdominal muscles and lift your feet off the floor, one foot at a time, so your knees and hips are bent in 90-degree angles (right angles). Your knees should be over your hips and your lower legs should be parallel to the floor. Keeping your abdominal muscles tense and your knee bent, slowly lower one of your legs so your toe touches the ground. Lift your leg back up to return to the starting position. Do not hold your breath. Do not let your back arch. Keep your back flat against the ground. Repeat with your other leg. Repeat __________ times. Complete this exercise __________ times a day. Posture and body mechanics Good posture and healthy body mechanics can help to relieve stress in your body's tissues and joints. Body mechanics refers to the movements and positions of your body while you do your daily activities. Posture is part of body  mechanics. Good posture means: Your spine is in its natural S-curve position (neutral). Your shoulders are pulled back slightly. Your head is not tipped forward (neutral). Follow these guidelines to improve your posture and body mechanics in your everyday activities. Standing  When standing, keep your spine neutral and your feet about hip-width apart. Keep a slight bend in your knees. Your ears, shoulders, and hips should line up. When you do a task in which you stand in one place for a long time, place one foot up on a stable object that is 2-4 inches (5-10 cm) high, such as a footstool. This helps keep your spine neutral. Sitting  When sitting, keep your spine neutral and keep your feet flat on the floor. Use a footrest, if necessary, and keep your thighs parallel to the floor. Avoid rounding your shoulders, and avoid tilting your head forward. When working at a desk or a computer, keep your desk at a height where your hands are slightly lower than your elbows. Slide your  chair under your desk so you are close enough to maintain good posture. When working at a computer, place your monitor at a height where you are looking straight ahead and you do not have to tilt your head forward or downward to look at the screen. Resting When lying down and resting, avoid positions that are most painful for you. If you have pain with activities such as sitting, bending, stooping, or squatting, lie in a position in which your body does not bend very much. For example, avoid curling up on your side with your arms and knees near your chest (fetal position). If you have pain with activities such as standing for a long time or reaching with your arms, lie with your spine in a neutral position and bend your knees slightly. Try the following positions: Lying on your side with a pillow between your knees. Lying on your back with a pillow under your knees. Lifting  When lifting objects, keep your feet at least  shoulder-width apart and tighten your abdominal muscles. Bend your knees and hips and keep your spine neutral. It is important to lift using the strength of your legs, not your back. Do not lock your knees straight out. Always ask for help to lift heavy or awkward objects. This information is not intended to replace advice given to you by your health care provider. Make sure you discuss any questions you have with your health care provider. Document Revised: 05/24/2022 Document Reviewed: 04/07/2020 Elsevier Patient Education  2024 [employer].

## 2023-01-25 ENCOUNTER — Encounter: Payer: Self-pay | Admitting: Family

## 2023-01-25 ENCOUNTER — Telehealth: Payer: Self-pay | Admitting: Family

## 2023-01-25 ENCOUNTER — Ambulatory Visit: Payer: Medicaid Other | Admitting: Family

## 2023-01-25 VITALS — BP 134/78 | HR 77 | Temp 98.7°F | Resp 16 | Ht 65.5 in | Wt 184.0 lb

## 2023-01-25 DIAGNOSIS — D219 Benign neoplasm of connective and other soft tissue, unspecified: Secondary | ICD-10-CM

## 2023-01-25 DIAGNOSIS — Z0000 Encounter for general adult medical examination without abnormal findings: Secondary | ICD-10-CM | POA: Diagnosis not present

## 2023-01-25 DIAGNOSIS — F32A Depression, unspecified: Secondary | ICD-10-CM | POA: Insufficient documentation

## 2023-01-25 DIAGNOSIS — I10 Essential (primary) hypertension: Secondary | ICD-10-CM | POA: Diagnosis not present

## 2023-01-25 DIAGNOSIS — R739 Hyperglycemia, unspecified: Secondary | ICD-10-CM | POA: Diagnosis not present

## 2023-01-25 DIAGNOSIS — E119 Type 2 diabetes mellitus without complications: Secondary | ICD-10-CM | POA: Insufficient documentation

## 2023-01-25 DIAGNOSIS — F331 Major depressive disorder, recurrent, moderate: Secondary | ICD-10-CM | POA: Diagnosis not present

## 2023-01-25 DIAGNOSIS — E785 Hyperlipidemia, unspecified: Secondary | ICD-10-CM | POA: Diagnosis not present

## 2023-01-25 DIAGNOSIS — Z1231 Encounter for screening mammogram for malignant neoplasm of breast: Secondary | ICD-10-CM

## 2023-01-25 LAB — LIPID PANEL
Cholesterol: 270 mg/dL — ABNORMAL HIGH (ref 0–200)
HDL: 44.9 mg/dL (ref >39.00)
LDL Cholesterol: 188 mg/dL — ABNORMAL HIGH (ref 0–99)
NonHDL: 225.14
Total CHOL/HDL Ratio: 6
Triglycerides: 188 mg/dL — ABNORMAL HIGH (ref 0.0–149.0)
VLDL: 37.6 mg/dL (ref 0.0–40.0)

## 2023-01-25 LAB — COMPREHENSIVE METABOLIC PANEL
ALT: 23 U/L (ref 0–35)
AST: 22 U/L (ref 0–37)
Albumin: 4.2 g/dL (ref 3.5–5.2)
Alkaline Phosphatase: 64 U/L (ref 39–117)
BUN: 11 mg/dL (ref 6–23)
CO2: 29 meq/L (ref 19–32)
Calcium: 8.9 mg/dL (ref 8.4–10.5)
Chloride: 101 meq/L (ref 96–112)
Creatinine, Ser: 0.77 mg/dL (ref 0.40–1.20)
GFR: 87.17 mL/min (ref >60.00)
Glucose, Bld: 108 mg/dL — ABNORMAL HIGH (ref 70–99)
Potassium: 3.4 meq/L — ABNORMAL LOW (ref 3.5–5.1)
Sodium: 140 meq/L (ref 135–145)
Total Bilirubin: 1.3 mg/dL — ABNORMAL HIGH (ref 0.2–1.2)
Total Protein: 6.6 g/dL (ref 6.0–8.3)

## 2023-01-25 LAB — TSH: TSH: 3.69 u[IU]/mL (ref 0.35–5.50)

## 2023-01-25 LAB — HEMOGLOBIN A1C: Hgb A1c MFr Bld: 6.9 % — ABNORMAL HIGH (ref 4.6–6.5)

## 2023-01-25 MED ORDER — ATORVASTATIN CALCIUM 20 MG PO TABS: 20.0000 mg | ORAL_TABLET | Freq: Every day | ORAL | 3 refills | Status: DC

## 2023-01-25 MED ORDER — LISINOPRIL 10 MG PO TABS: 10.0000 mg | ORAL_TABLET | Freq: Every day | ORAL | 1 refills | Status: DC

## 2023-01-25 NOTE — Assessment & Plan Note (Signed)
She believes that this is causing her pain. Refer back to GYN.

## 2023-01-25 NOTE — Telephone Encounter (Signed)
Please advise pt that her sugar is now showing diabetes.  Please work on reducing white carbs and concentrated sweets, exercise and weight loss.  Cholesterol is very high.  In the setting of diabetes diagnosis it is extra important that we treat her cholesterol to decrease her risk of heart attack and stroke. Begin atorvastatin 20mg  once daily in addition to working on diet.  Potassium is a little bit low.  It should come up when she restarts lisinopril.

## 2023-01-25 NOTE — Assessment & Plan Note (Signed)
  Reports daily depressive symptoms and mood swings. History of beneficial response to Zoloft. -Refer to Psychiatry for further evaluation and management given concern for bipolar disaorder.

## 2023-01-25 NOTE — Progress Notes (Signed)
Subjective:     Patient ID: Diane Sexton, female    DOB: March 11, 1970, 52 y.o.   MRN: [id number]  Chief Complaint  Patient presents with   New Patient (Initial Visit)    HPI  Discussed the use of AI scribe software for clinical note transcription with the patient, who gave verbal consent to proceed.  History of Present Illness   The patient, with a past medical history of hypertension, depression, and fibroids, presents for a physical exam after a gap of two years. The patient was previously on Lisinopril for hypertension but ran out a month ago. Despite this, the patient's blood pressure at the visit is within normal limits. The patient does not monitor her blood pressure at home.  The patient also reports a history of depression and severe mood swings, which she describes as periods of feeling on top of the world, followed by periods of not wanting to get out of bed or interact with anyone. The patient reports that these mood swings can change within the course of a day and can affect her relationships. The patient has previously been on Zoloft, which she felt helped level out her mood swings.  The patient also reports discomfort due to fibroids. Despite being post-menopausal, the patient reports that the fibroids cause constant pain. The patient has previously had a uterine embolization procedure for heavy bleeding, which was successful.  The patient also mentions a history of abnormal Pap smears but does not provide further details. The patient's family history is significant for diabetes, with multiple family members on both sides having the condition.          Health Maintenance Due  Topic Date Due   DTaP/Tdap/Td (1 - Tdap) Never done   Colonoscopy  Never done   Zoster Vaccines- Shingrix (1 of 2) Never done   COVID-19 Vaccine (4 - 2024-25 season) 10/03/2022    Past Medical History:  Diagnosis Date   Constipation    Depression    bi-polar   DUB (dysfunctional  uterine bleeding)    Fibroids    History of trichomoniasis 2018   Hyperlipemia    Hypertension    PMDD (premenstrual dysphoric disorder)     Past Surgical History:  Procedure Laterality Date   APPENDECTOMY  2009   [place]  2008    Family History  Problem Relation Age of Onset   Hypertension Mother    Neuropathy Mother    Hypertension Father    Diabetes Mellitus II Father    Diabetes Sister        "pre-diabetic"   Diabetes Mellitus II Brother    Dementia Maternal Grandmother    Heart attack Maternal Grandmother 48   Diabetes Mellitus II Paternal Grandmother    Diabetes Mellitus II Paternal Aunt    Diabetes Mellitus II Paternal Aunt    Diabetes Mellitus II Paternal Uncle    Diabetes Mellitus II Paternal Uncle     Social History   Socioeconomic History   Marital status: Single    Spouse name: Not on file   Number of children: 0   Years of education: Not on file   Highest education level: Some college, no degree  Occupational History   Occupation: Agricultural engineer: [employer]  Tobacco Use   Smoking status: Never   Smokeless tobacco: Never  Vaping Use   Vaping status: Never Used  Substance and Sexual Activity   Alcohol use: Yes    Alcohol/week: 1.0 standard  drink of alcohol    Types: 1 Standard drinks or equivalent per week    Comment: occasionally   Drug use: No   Sexual activity: Not Currently    Partners: Female, Female    Birth control/protection: None  Other Topics Concern   Not on file  Social History Narrative   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)   Lives with mother   No children   Single, never married   Completed associates, she is in school for [employer] in Criminal Justice/Human services   Enjoys walking, reading, writing   No pets   Social Drivers of Health   Financial Resource Strain: High Risk (10/08/2022)   Overall Financial Resource Strain (CARDIA)    Difficulty of Paying Living Expenses: Very hard   Food Insecurity: Not on File (10/28/2022)   Received from [facility] Insecurity    Food: 0  Transportation Needs: No Transportation Needs (02/25/2022)   PRAPARE - Administrator, Civil Service (Medical): No    Lack of Transportation (Non-Medical): No  Physical Activity: Inactive (10/08/2022)   Exercise Vital Sign    Days of Exercise per Week: 0 days    Minutes of Exercise per Session: 0 min  Stress: Stress Concern Present (10/08/2022)   [employer] of Occupational Health - Occupational Stress Questionnaire    Feeling of Stress : Very much  Social Connections: Not on File (10/21/2022)   Received from [facility]   Social Connections    Connectedness: 0  Recent Concern: Social Connections - Moderately Isolated (10/08/2022)   Social Connection and Isolation Panel [NHANES]    Frequency of Communication with Friends and Family: More than three times a week    Frequency of Social Gatherings with Friends and Family: More than three times a week    Attends Religious Services: More than 4 times per year    Active Member of [employer] or Organizations: No    Attends Banker Meetings: Never    Marital Status: Never married  Intimate Partner Violence: Not At Risk (10/08/2022)   Humiliation, Afraid, Rape, and Kick questionnaire    Fear of Current or Ex-Partner: No    Emotionally Abused: No    Physically Abused: No    Sexually Abused: No    Outpatient Medications Prior to Visit  Medication Sig Dispense Refill   baclofen (LIORESAL) 10 MG tablet Take 1 tablet (10 mg total) by mouth 3 (three) times daily. 30 each 0   ibuprofen (ADVIL) 600 MG tablet Take 1 tablet (600 mg total) by mouth 3 (three) times daily. 15 tablet 0   lisinopril (ZESTRIL) 10 MG tablet Take 10 mg by mouth daily. (Patient not taking: Reported on 01/25/2023)     sertraline (ZOLOFT) 50 MG tablet Take 1 tablet (50 mg total) by mouth daily. 30 tablet 1   No facility-administered medications prior to visit.     Allergies  Allergen Reactions   Oxycodone Other (See Comments)    Upset stomach   Cyclobenzaprine Nausea Only   Hydrocodone-Apap-Dietary Prod Nausea And Vomiting   Ibuprofen Other (See Comments)    Pt states she can't tolerate     ROS See HPI    Objective:    Physical Exam Constitutional:      General: She is not in acute distress.    Appearance: Normal appearance. She is well-developed.  HENT:     Head: Normocephalic and atraumatic.     Right Ear: External ear  normal.     Left Ear: External ear normal.  Eyes:     General: No scleral icterus. Neck:     Thyroid: No thyromegaly.  Cardiovascular:     Rate and Rhythm: Normal rate and regular rhythm.     Heart sounds: Normal heart sounds. No murmur heard. Pulmonary:     Effort: Pulmonary effort is normal. No respiratory distress.     Breath sounds: Normal breath sounds. No wheezing.  Musculoskeletal:     Cervical back: Neck supple.  Skin:    General: Skin is warm and dry.  Neurological:     Mental Status: She is alert and oriented to person, place, and time.  Psychiatric:        Mood and Affect: Mood normal.        Behavior: Behavior normal.        Thought Content: Thought content normal.        Judgment: Judgment normal.      BP 134/78 (BP Location: Right Arm, Patient Position: Sitting, Cuff Size: Large)   Pulse 77   Temp 98.7 F (37.1 C) (Oral)   Resp 16   Ht 5' 5.5" (1.664 m)   Wt 184 lb (83.5 kg)   LMP 11/07/2019   SpO2 96%   BMI 30.15 kg/m  Wt Readings from Last 3 Encounters:  01/25/23 184 lb (83.5 kg)  01/08/23 178 lb (80.7 kg)  11/15/22 180 lb (81.6 kg)       Assessment & Plan:   Problem List Items Addressed This Visit       Unprioritized   MDD (major depressive disorder), recurrent episode, moderate (HCC)    Reports daily depressive symptoms and mood swings. History of beneficial response to Zoloft. -Refer to Psychiatry for further evaluation and management given concern for bipolar  disaorder.        Hypertension    Blood pressure controlled off medication. Previously on Lisinopril with good control and no side effects. -Resume Lisinopril 10 mg daily. -Recheck blood pressure at physical.       Relevant Medications   lisinopril (ZESTRIL) 10 MG tablet   Fibroids   She believes that this is causing her pain. Refer back to GYN.       Relevant Orders   Ambulatory referral to Obstetrics / Gynecology   Depression - Primary   Other Visit Diagnoses       Hyperglycemia       Relevant Orders   Comp Met (CMET)   HgB A1c     Hyperlipidemia, unspecified hyperlipidemia type       Relevant Medications   lisinopril (ZESTRIL) 10 MG tablet   Other Relevant Orders   Lipid panel     Preventative health care       Relevant Orders   Ambulatory referral to Optometry   TSH     Breast cancer screening by mammogram       Relevant Orders   MM 3D SCREENING MAMMOGRAM BILATERAL BREAST       I have discontinued Tanette L. Kostka's ibuprofen, sertraline, and baclofen. I have also changed her lisinopril.  Meds ordered this encounter  Medications   lisinopril (ZESTRIL) 10 MG tablet    Sig: Take 1 tablet (10 mg total) by mouth daily.    Dispense:  90 tablet    Refill:  1    Supervising Provider:   Danise Edge A [[id number]]

## 2023-01-25 NOTE — Assessment & Plan Note (Signed)
  Blood pressure controlled off medication. Previously on Lisinopril with good control and no side effects. -Resume Lisinopril 10 mg daily. -Recheck blood pressure at physical.

## 2023-01-25 NOTE — Patient Instructions (Addendum)
  Psychiatric Services:  [facility] [place]) [phone] [facility] [place]) - [phone] Dr. Milagros Evener [facility]) [phone] [facility]) [phone]  VISIT SUMMARY:  You came in for a physical exam after a two-year gap. We discussed your hypertension, depression, fibroids, and other health concerns. Your blood pressure is currently within normal limits, and we have a plan to address your other issues.  YOUR PLAN:  -HYPERTENSION: Hypertension is high blood pressure. Your blood pressure is currently controlled even though you ran out of your medication. We will resume your Lisinopril at 10 mg daily and recheck your blood pressure at your physical exam.  -DEPRESSION/BIPOLAR DISORDER: Depression and bipolar disorder involve mood swings and periods of severe depression. You reported daily depressive symptoms and mood swings. We will refer you to Psychiatry for further evaluation and management.  -UTERINE FIBROIDS: Uterine fibroids are non-cancerous growths in the uterus that can cause pain. Despite being post-menopausal, you are experiencing ongoing pain. We will refer you back to Gynecology for further evaluation and management.  -HYPERLIPIDEMIA: Hyperlipidemia is high cholesterol. We will check your lipid panel today to assess your cholesterol levels.  -DIABETES RISK: You have a strong family history of diabetes and previously had slightly elevated blood sugar. We will check your HbA1c today to assess your risk for diabetes.  -EYE HEALTH: You requested an eye exam. We will refer you to Optometry for an eye exam.  -GENERAL HEALTH MAINTENANCE: We will check your liver and kidney function, and thyroid function today. We will also schedule a mammogram for the end of January 2025. Your immunizations are up to date, and you have declined the annual flu vaccine.  INSTRUCTIONS:  Please follow up with  Psychiatry for your mood swings and depression. Also, follow up with Gynecology for your uterine fibroids. Get your blood tests done today for cholesterol, HbA1c, liver and kidney function, and thyroid function. Schedule an eye exam with Optometry and a mammogram for the end of January 2025.

## 2023-01-27 NOTE — Telephone Encounter (Signed)
Called patient but no answer, left voice mail for patient to call back.   

## 2023-01-28 ENCOUNTER — Telehealth: Payer: Self-pay

## 2023-01-28 NOTE — Telephone Encounter (Signed)
Copied from CRM [id number]. Topic: Clinical - Request for Lab/Test Order >> Jan 28, 2023  8:06 AM Steele Sizer wrote: Reason for CRM: Pt called to request a call back from [place] regarding her test results. Callback number will be [phone]

## 2023-01-28 NOTE — Telephone Encounter (Signed)
Patient called office back and was notified of results, provider's comments and new prescription. She verbalized understanding.

## 2023-01-28 NOTE — Telephone Encounter (Signed)
Called patient but no answer, left voice mail for patient to call back.   

## 2023-01-28 NOTE — Telephone Encounter (Signed)
Called patient back.

## 2023-01-31 ENCOUNTER — Telehealth (HOSPITAL_BASED_OUTPATIENT_CLINIC_OR_DEPARTMENT_OTHER): Payer: Self-pay | Admitting: Family

## 2023-01-31 ENCOUNTER — Inpatient Hospital Stay (HOSPITAL_BASED_OUTPATIENT_CLINIC_OR_DEPARTMENT_OTHER): Admission: RE | Admit: 2023-01-31 | Payer: Medicaid Other | Source: Ambulatory Visit

## 2023-02-03 ENCOUNTER — Ambulatory Visit (INDEPENDENT_AMBULATORY_CARE_PROVIDER_SITE_OTHER): Payer: Medicaid Other | Admitting: Obstetrics and Gynecology

## 2023-02-03 ENCOUNTER — Other Ambulatory Visit: Payer: Self-pay

## 2023-02-03 ENCOUNTER — Encounter: Payer: Self-pay | Admitting: Obstetrics and Gynecology

## 2023-02-03 VITALS — BP 143/88 | HR 86 | Ht 65.0 in | Wt 185.0 lb

## 2023-02-03 DIAGNOSIS — N941 Unspecified dyspareunia: Secondary | ICD-10-CM | POA: Diagnosis not present

## 2023-02-03 DIAGNOSIS — D219 Benign neoplasm of connective and other soft tissue, unspecified: Secondary | ICD-10-CM | POA: Diagnosis not present

## 2023-02-03 DIAGNOSIS — R102 Pelvic and perineal pain: Secondary | ICD-10-CM

## 2023-02-03 DIAGNOSIS — G8929 Other chronic pain: Secondary | ICD-10-CM | POA: Diagnosis not present

## 2023-02-03 NOTE — Progress Notes (Signed)
 Gad-7 elevated today, pt would like follow up appt with jamie

## 2023-02-03 NOTE — Patient Instructions (Signed)
 Myomectomy = just removal of the large fibroid hanging of the uterus  Hysterectomy = removal of uterus (with all of it's fibroids), fallopian tubes, and cervix; ovaries can be left behind

## 2023-02-03 NOTE — Progress Notes (Signed)
 GYNECOLOGY VISIT  Patient name: Diane Sexton MRN [id number]  Date of birth: 04-30-1970 Chief Complaint:   Pelvic Pain   History:  Diane Sexton is a 53 y.o. G3P0030 being seen today for question of next pap smear and pelvic pain.     Discussed the use of AI scribe software for clinical note transcription with the patient, who gave verbal consent to proceed.  History of Present Illness   The patient, a 53 year old individual with a history of fibroids, presents with chronic lower abdominal pain. The pain is described as sharp and is exacerbated by bending over, stretching, and standing up quickly. The pain is also experienced during intercourse, which the patient describes as very painful. The patient has been experiencing these symptoms for several years, dating back to an appendectomy in 2008, thought noted that no appendicits noted during surgery (minimally invasive approach). During this procedure, a contorted fibroid was discovered and manipulated, which the patient believes initiated the onset of the abdominal pain.  The patient's history of fibroids is complex. She underwent a Uterine Fibroid Embolization (UFE) procedure, which was reported to have been successful in treating the fibroids. However, the patient continues to experience pain, which she attributes to the fibroids. The patient reports that the pain is localized in the lower abdomen and vaginal area, and is particularly severe during physical activities such as exercise and most recently intercourse.  The patient also reports a history of lower back pain, which she is unsure if it is related to the fibroids or due to separate issues. The patient has not had any other surgeries in the abdominal area, and has no history of C-sections. The patient is currently in menopause and is considering surgical options for the treatment of the fibroids, including a full hysterectomy or the removal of the larger fibroid but she is  unsure.       Past Medical History:  Diagnosis Date   Constipation    Depression    bi-polar   Diabetes type 2, controlled (HCC)    DUB (dysfunctional uterine bleeding)    Fibroids    History of trichomoniasis 2018   Hyperlipemia    Hypertension    PMDD (premenstrual dysphoric disorder)     Past Surgical History:  Procedure Laterality Date   APPENDECTOMY  2009   [place]  2008    The following portions of the patient's history were reviewed and updated as appropriate: allergies, current medications, past family history, past medical history, past social history, past surgical history and problem list.   Health Maintenance:   Last pap     Component Value Date/Time   DIAGPAP  01/11/2022 1036    - Negative for intraepithelial lesion or malignancy (NILM)   DIAGPAP  12/14/2018 1052    - Negative for intraepithelial lesion or malignancy (NILM)   DIAGPAP  11/29/2017 0000    NEGATIVE FOR INTRAEPITHELIAL LESIONS OR MALIGNANCY.   HPVHIGH Negative 01/11/2022 1036   HPVHIGH Negative 12/14/2018 1052   ADEQPAP  01/11/2022 1036    Satisfactory for evaluation; transformation zone component ABSENT.   ADEQPAP  12/14/2018 1052    Satisfactory for evaluation; transformation zone component PRESENT.   ADEQPAP  11/29/2017 0000    Satisfactory for evaluation  endocervical/transformation zone component PRESENT.    High Risk HPV: Positive  Adequacy:  Satisfactory for evaluation, transformation zone component PRESENT  Diagnosis:  Atypical squamous cells of undetermined significance (ASC-US )  Last mammogram: 02/2022 BIRADS 1   Review of  Systems:  Pertinent items are noted in HPI. Comprehensive review of systems was otherwise negative.   Objective:  Physical Exam BP (!) 157/89   Pulse 86   Ht 5' 5 (1.651 m)   Wt 185 lb (83.9 kg)   LMP 11/07/2019   BMI 30.79 kg/m    Physical Exam Vitals and nursing note reviewed. Exam conducted with a chaperone present.  Constitutional:       Appearance: Normal appearance.  HENT:     Head: Normocephalic and atraumatic.  Pulmonary:     Effort: Pulmonary effort is normal.     Breath sounds: Normal breath sounds.  Abdominal:     Comments: + carnett bilateral lower abdomen Mild TTP in lower abdmen  Genitourinary:    General: Normal vulva.     Exam position: Lithotomy position.     Vagina: Normal.     Cervix: Normal.     Comments: Normal appearing vulva Normal vulvar sensation bilaterally Nontender superficial pelvic floor muscles Nontender ischial tuberosities bilaterally  Allodynia at introitus: No  Anal wink present Posterior vaginal wall tender Right levator ani 7/10 Right ischiococcygeous 8/10 Right obturator internus 10/10 Left levator ani 8/10 Left ischioccocygeous 8/10 Left obturator internus 10/10 Anterior vaginal wall nontender Uterine nontender   Skin:    General: Skin is warm and dry.  Neurological:     General: No focal deficit present.     Mental Status: She is alert.  Psychiatric:        Mood and Affect: Mood normal.        Behavior: Behavior normal.        Thought Content: Thought content normal.        Judgment: Judgment normal.      Labs and Imaging CT ABDOMEN PELVIS 08/2022 CLINICAL DATA:  Frequency and suprapubic abdominal pain   EXAM: CT ABDOMEN AND PELVIS WITH CONTRAST   TECHNIQUE: Multidetector CT imaging of the abdomen and pelvis was performed using the standard protocol following bolus administration of intravenous contrast.   RADIATION DOSE REDUCTION: This exam was performed according to the departmental dose-optimization program which includes automated exposure control, adjustment of the mA and/or kV according to patient size and/or use of iterative reconstruction technique.   CONTRAST:  100mL OMNIPAQUE  IOHEXOL  300 MG/ML  SOLN   COMPARISON:  None Available.   FINDINGS: Lower chest:  No contributory findings.   Hepatobiliary: No focal liver abnormality.No evidence  of biliary obstruction or stone.   Pancreas: Unremarkable.   Spleen: Unremarkable.   Adrenals/Urinary Tract: Negative adrenals. No hydronephrosis or stone. Unremarkable bladder.   Stomach/Bowel: No obstruction. Appendectomy. Mild colonic diverticulosis.   Vascular/Lymphatic: No acute vascular abnormality. No mass or adenopathy. Fatty umbilical hernia.   Reproductive:Multiple uterine fibroids including an exophytic ventral heavily calcified fibroid measuring 6.4 cm.   Other: No ascites or pneumoperitoneum.   Musculoskeletal: No acute abnormalities. Degeneration with slight L4-5 anterolisthesis.   IMPRESSION: 1. No acute finding. 2. Fibroid uterus, small fatty umbilical hernia, and colonic diverticulosis.     Electronically Signed   By: Dorn Roulette M.D.   On: 08/10/2022 18:59    Assessment & Plan:   Assessment and Plan    Uterine Fibroids and Pelvic pain Chronic lower abdominal pain, exacerbated by movement and intercourse. History of appendectomy and UFE. CT scan shows a large exophytic fibroid , which may be contributing to the pain. Also, possible pelvic floor myalgia present possibly secondary to fibroid or chronic LBP. -pelvic floor physical therapy for dyspareunia and  lower abdominal pain -Patient to decide between myomectomy to remove the large fibroid or total hysterectomy. would like to have surgery planned for May 2025 - will message when she has decided which to pursue. -Discussed myomectomy would be for exophytic fibroid as the smaller intramural fibroids not likely to be contributory to pain  -Discussed risks/benefits of both surgical procedures -Noted that surgery could be done with minimally invasive techniques but slightly larger incision needed for specimen removal given calcification of specimen -Discussed may be scar tissue from prior surgery and potential risks associated with that  Pap Smear Last normal Pap smear in 2023. No abnormal findings or  changes since then. -No immediate need for repeat Pap smear as per current guidelines (every 3 years for normal results).  Lower Back Pain Chronic lower back pain, possibly related to fibroids or compensatory movements due to pain. -Address in pelvic floor physical therapy.       Routine preventative health maintenance measures emphasized.  Carter Quarry, MD Minimally Invasive Gynecologic Surgery Center for [facility], [facility]

## 2023-02-21 ENCOUNTER — Ambulatory Visit (HOSPITAL_COMMUNITY): Payer: Medicaid Other | Admitting: Mental Health

## 2023-02-21 ENCOUNTER — Encounter (HOSPITAL_COMMUNITY): Payer: Self-pay

## 2023-03-01 ENCOUNTER — Encounter: Payer: Medicaid Other | Admitting: Family

## 2023-03-01 ENCOUNTER — Ambulatory Visit (INDEPENDENT_AMBULATORY_CARE_PROVIDER_SITE_OTHER): Payer: Medicaid Other | Admitting: Family

## 2023-03-01 ENCOUNTER — Ambulatory Visit (HOSPITAL_BASED_OUTPATIENT_CLINIC_OR_DEPARTMENT_OTHER)
Admission: RE | Admit: 2023-03-01 | Discharge: 2023-03-01 | Disposition: A | Discharge status: Home / Self Care | Payer: Medicaid Other | Source: Ambulatory Visit | Attending: Family | Admitting: Family

## 2023-03-01 VITALS — BP 135/79 | HR 103 | Temp 98.9°F | Resp 16 | Ht 65.0 in | Wt 186.6 lb

## 2023-03-01 DIAGNOSIS — I10 Essential (primary) hypertension: Secondary | ICD-10-CM

## 2023-03-01 DIAGNOSIS — M545 Low back pain, unspecified: Secondary | ICD-10-CM | POA: Insufficient documentation

## 2023-03-01 DIAGNOSIS — R102 Pelvic and perineal pain: Secondary | ICD-10-CM

## 2023-03-01 DIAGNOSIS — R058 Other specified cough: Secondary | ICD-10-CM | POA: Insufficient documentation

## 2023-03-01 DIAGNOSIS — E119 Type 2 diabetes mellitus without complications: Secondary | ICD-10-CM | POA: Diagnosis not present

## 2023-03-01 DIAGNOSIS — R519 Headache, unspecified: Secondary | ICD-10-CM | POA: Diagnosis not present

## 2023-03-01 NOTE — Assessment & Plan Note (Signed)
BP Readings from Last 3 Encounters:  03/01/23 135/79  02/03/23 (!) 143/88  01/25/23 134/78   BP stab/improved now that she is back on lisinopril. Continue same.

## 2023-03-01 NOTE — Assessment & Plan Note (Signed)
Lab Results  Component Value Date   HGBA1C 6.9 (H) 01/25/2023   HGBA1C 6.0 (H) 12/16/2017   Lab Results  Component Value Date   LDLCALC 188 (H) 01/25/2023   CREATININE 0.77 01/25/2023   Offered GLP-1. She states that she would like to see what she can do with diet/exercise first.

## 2023-03-01 NOTE — Assessment & Plan Note (Signed)
Obtain x-ray of pelvis.

## 2023-03-01 NOTE — Patient Instructions (Signed)
VISIT SUMMARY:  During today's visit, we discussed your ongoing pelvic and lower back pain, recent dry cough, tension headaches, type 2 diabetes management, and eye irritation. We have outlined a plan to address each of these issues to help improve your overall health and well-being.  YOUR PLAN:  -PELVIC PAIN: Pelvic pain can be caused by various factors, including structural issues or inflammation. We will order pelvic and lumbar spine x-rays to check for any structural abnormalities and refer you to physical therapy for further evaluation and treatment.  -CHRONIC LOWER BACK PAIN: Chronic lower back pain is often due to long-term strain or injury. We will order lumbar spine x-rays to assess for any structural issues and refer you to physical therapy for further evaluation and treatment.  -DRY COUGH: A dry cough can be a side effect of certain medications, such as Lisinopril. We will monitor your symptoms and consider changing your medication if the cough becomes intolerable.  -TENSION HEADACHES: Tension headaches are often caused by stress or environmental factors. You can use BC powder occasionally for relief from these headaches.  -TYPE 2 DIABETES: Type 2 diabetes is a condition where your body has difficulty regulating blood sugar levels. Continue managing your diabetes with diet and exercise. We may consider adding Ozempic in the future to help with weight loss and glucose control. We will check your A1C levels again in April.  -EYE IRRITATION: Eye irritation can feel like there is something in your eye. Please ensure you follow up with the ophthalmology referral to get this checked out.  INSTRUCTIONS:  Please follow up with the pelvic and lumbar spine x-rays and start physical therapy as soon as possible. Monitor your dry cough and let us know if it worsens. Continue using BC powder for headaches as needed. Maintain your current diabetes management plan and schedule your follow-up  appointment in April for an A1C check. Lastly, make sure to schedule your ophthalmology appointment for your eye irritation.
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
## 2023-03-01 NOTE — Assessment & Plan Note (Signed)
Does not sound like migraine but likely more tension headache. She is using BC powder sparingly which she finds helpful.

## 2023-03-01 NOTE — Assessment & Plan Note (Signed)
Seems to align with when she started lisinopril. Likely ACE cough.  She does not wish to stop ACE as she has not tolerated other bp meds as well and feels like she can deal with the cough. Monitor.

## 2023-03-01 NOTE — Assessment & Plan Note (Signed)
Intolerant to NSAIDs.  Will refer for PT, obtain x-ray of lumbar spine.

## 2023-03-03 ENCOUNTER — Ambulatory Visit (HOSPITAL_BASED_OUTPATIENT_CLINIC_OR_DEPARTMENT_OTHER)
Admission: RE | Admit: 2023-03-03 | Discharge: 2023-03-03 | Disposition: A | Discharge status: Home / Self Care | Payer: Medicaid Other | Source: Ambulatory Visit | Attending: Family | Admitting: Family

## 2023-03-03 ENCOUNTER — Encounter (HOSPITAL_BASED_OUTPATIENT_CLINIC_OR_DEPARTMENT_OTHER): Payer: Self-pay

## 2023-03-03 DIAGNOSIS — Z1231 Encounter for screening mammogram for malignant neoplasm of breast: Secondary | ICD-10-CM | POA: Diagnosis present

## 2023-03-04 ENCOUNTER — Encounter: Payer: Self-pay | Admitting: Family

## 2023-03-11 ENCOUNTER — Encounter: Payer: Self-pay | Admitting: Family

## 2023-03-11 DIAGNOSIS — R102 Pelvic and perineal pain: Secondary | ICD-10-CM

## 2023-03-11 DIAGNOSIS — M545 Low back pain, unspecified: Secondary | ICD-10-CM

## 2023-03-16 ENCOUNTER — Ambulatory Visit: Payer: Medicaid Other | Admitting: Family Medicine

## 2023-03-16 NOTE — Progress Notes (Deleted)
   Rubin Payor, PhD, LAT, ATC acting as a scribe for Diane Graham, MD.  Diane Sexton is a 53 y.o. female who presents to [facility] at [facility] today for low back and hip pain x ***. OBGYN has already referred pt to pelvic floor PT. Pt locates pain to ***  Radiating pain: LE numbness/tingling: LE weakness: Aggravates: Treatments tried:   Pertinent review of systems: ***  Relevant historical information: ***   Exam:  LMP 11/07/2019  General: Well Developed, well nourished, and in no acute distress.   MSK: ***    Lab and Radiology Results No results found for this or any previous visit (from the past 72 hours). No results found.     Assessment and Plan: 53 y.o. female with ***   PDMP not reviewed this encounter. No orders of the defined types were placed in this encounter.  No orders of the defined types were placed in this encounter.    Discussed warning signs or symptoms. Please see discharge instructions. Patient expresses understanding.   ***

## 2023-03-17 NOTE — Progress Notes (Deleted)
   Rubin Payor, PhD, LAT, ATC acting as a scribe for [name], MD.  Barnie Alderman Gillies is a 53 y.o. female who presents to [facility] at [facility] today for low back and hip pain x ***. OBGYN has evaluated and referred pt to pelvic floor PT. Pt locates pain to ***  Radiating pain: LE numbness/tingling: LE weakness: Aggravates: Treatments tried:   Dx testing: 03/01/23 L-spine & pelvis XR  08/10/22 Ab/pelvis CT  05/07/22 Transvaginal pelvic US  Pertinent review of systems: ***  Relevant historical information: ***   Exam:  LMP 11/07/2019  General: Well Developed, well nourished, and in no acute distress.   MSK: ***    Lab and Radiology Results No results found for this or any previous visit (from the past 72 hours). No results found.     Assessment and Plan: 53 y.o. female with ***   PDMP not reviewed this encounter. No orders of the defined types were placed in this encounter.  No orders of the defined types were placed in this encounter.    Discussed warning signs or symptoms. Please see discharge instructions. Patient expresses understanding.   ***

## 2023-03-18 ENCOUNTER — Ambulatory Visit: Payer: Medicaid Other | Admitting: Family Medicine

## 2023-03-24 ENCOUNTER — Ambulatory Visit: Payer: Medicaid Other | Admitting: Family Medicine

## 2023-03-28 ENCOUNTER — Ambulatory Visit: Payer: Medicaid Other | Admitting: Physical Therapy

## 2023-03-28 ENCOUNTER — Other Ambulatory Visit: Payer: Self-pay

## 2023-03-28 ENCOUNTER — Ambulatory Visit (INDEPENDENT_AMBULATORY_CARE_PROVIDER_SITE_OTHER): Payer: Medicaid Other | Admitting: Family Medicine

## 2023-03-28 VITALS — BP 146/96 | HR 77 | Ht 65.0 in | Wt 185.0 lb

## 2023-03-28 DIAGNOSIS — M25552 Pain in left hip: Secondary | ICD-10-CM | POA: Diagnosis not present

## 2023-03-28 DIAGNOSIS — M25551 Pain in right hip: Secondary | ICD-10-CM | POA: Diagnosis not present

## 2023-03-28 NOTE — Patient Instructions (Addendum)
 Thank you for coming in today.   Coccyx offloading cushion.   You are set to start Physical Therapy.  Let us know how this goes.   Recheck in 8 weeks.   Let me know if you are getting better   Please work on the home exercises the athletic trainer went over with you:  View at [email or web address] using code: [email or web address]

## 2023-03-28 NOTE — Progress Notes (Signed)
 Rubin Payor, PhD, LAT, ATC acting as a scribe for [name], MD.  Diane Sexton is a 53 y.o. female who presents to [facility] at [facility] today for low back and hip pain started around Dec. OBGYN has evaluated and referred pt to pelvic floor PT. Pt locates pain to bilat groins and into the sacrum/coccyx. She feels like her hips "pop" out of place. This "popping" sensation will occur w/out reason.   Radiating pain: no LE numbness/tingling: no LE weakness: no Aggravates: sitting, walking Treatments tried:   Dx testing: 03/01/23 L-spine & pelvis XR  08/10/22 Ab/pelvis CT  05/07/22 Transvaginal pelvic US  Pertinent review of systems: No fevers or chills  Relevant historical information: Calcified uterine fibroid   Exam:  BP (!) 146/96   Pulse 77   Ht 5\' 5"  (1.651 m)   Wt 185 lb (83.9 kg)   LMP 11/07/2019   SpO2 97%   BMI 30.79 kg/m  General: Well Developed, well nourished, and in no acute distress.   MSK: L-spine nontender to palpation midline normal lumbar motion.  Hips bilaterally normal-appearing normal motion.  Strength reduced abduction and external rotation bilaterally 4/5. No pain with SI rock test.    Lab and Radiology Results  DG Lumbar Spine Complete Result Date: 03/10/2023 CLINICAL DATA:  low back pain EXAM: LUMBAR SPINE - COMPLETE 4+ VIEW COMPARISON:  CT abdomen pelvis 08/10/2022, x-ray lumbar spine 01/08/2023, x-ray pelvis 03/01/2023 FINDINGS: Multilevel mild degenerative changes of the spine with moderate severe facet arthropathy at the L4-L5 and L5-S1 levels. There is no evidence of lumbar spine fracture. Stable grade 1 anterolisthesis of L4 on L5. Otherwise alignment is normal. Intervertebral disc spaces are maintained. IMPRESSION: 1. No acute displaced fracture or traumatic listhesis of the lumbar spine. 2. Please see separately dictated x-ray pelvis 03/01/2023. Electronically Signed   By: Tish Frederickson M.D.   On: 03/10/2023  23:42   DG Pelvis 1-2 Views Result Date: 03/10/2023 CLINICAL DATA:  feels like she has frontal pelvic instability pops in the AM. EXAM: PELVIS - 1-2 VIEW COMPARISON:  None Available. FINDINGS: No acute displaced fracture or dislocation of either hips on frontal view. No evidence of pelvic fracture or diastasis. No pelvic bone lesions are seen. Coarsely calcified masses overlying the pelvis consistent with known uterine fibroids better evaluated on CT abdomen pelvis 08/10/2022. IMPRESSION: Negative for acute traumatic injury. Electronically Signed   By: Tish Frederickson M.D.   On: 03/10/2023 23:41   MM 3D SCREENING MAMMOGRAM BILATERAL BREAST Result Date: 03/07/2023 CLINICAL DATA:  Screening. EXAM: DIGITAL SCREENING BILATERAL MAMMOGRAM WITH TOMOSYNTHESIS AND CAD TECHNIQUE: Bilateral screening digital craniocaudal and mediolateral oblique mammograms were obtained. Bilateral screening digital breast tomosynthesis was performed. The images were evaluated with computer-aided detection. COMPARISON:  Previous exam(s). ACR Breast Density Category c: The breasts are heterogeneously dense, which may obscure small masses. FINDINGS: There are no findings suspicious for malignancy. IMPRESSION: No mammographic evidence of malignancy. A result letter of this screening mammogram will be mailed directly to the patient. RECOMMENDATION: Screening mammogram in one year. (Code:[id number]) BI-RADS CATEGORY  1: Negative. Electronically Signed   By: Baird Lyons M.D.   On: 03/07/2023 14:35   DG Lumbar Spine 2-3 Views Result Date: 01/08/2023 CLINICAL DATA:  Back pain. EXAM: LUMBAR SPINE - 2-3 VIEW COMPARISON:  04/20/2017 FINDINGS: No evidence for an acute fracture. No subluxation. Intervertebral disc spaces are preserved. Calcified fibroids project over the lower pelvis, as before. IMPRESSION: 1. No acute  bony findings. 2. Calcified fibroids. Electronically Signed   By: Kennith Center M.D.   On: 01/08/2023 09:21   I, [name],  personally (independently) visualized and performed the interpretation of the x-ray images attached in this note.   Assessment and Plan: 53 y.o. female with chronic anterior hip pain and chronic coccyx pain.  This is also started with some popping and clicking sensation in the anterior hips bilaterally.  She already has pelvic physical therapy ordered and will start pelvic PT in about a month.  For now home exercise taught for hip abduction strengthening.  Proceed to pelvic PT.  Recheck in 2 months.  Recommend also coccyx off loader cushion.   PDMP not reviewed this encounter. Orders Placed This Encounter  Procedures   [place] LIMITED JOINT SPACE STRUCTURES LOW BILAT(NO LINKED CHARGES)    Reason for Exam (SYMPTOM  OR DIAGNOSIS REQUIRED):   bilateral hip pain    Preferred imaging location?:   [facility]   No orders of the defined types were placed in this encounter.    Discussed warning signs or symptoms. Please see discharge instructions. Patient expresses understanding.   The above documentation has been reviewed and is accurate and complete [name], M.D.

## 2023-03-30 ENCOUNTER — Ambulatory Visit: Payer: Medicaid Other | Admitting: Physical Therapy

## 2023-04-05 ENCOUNTER — Emergency Department (HOSPITAL_BASED_OUTPATIENT_CLINIC_OR_DEPARTMENT_OTHER)

## 2023-04-05 ENCOUNTER — Emergency Department (HOSPITAL_BASED_OUTPATIENT_CLINIC_OR_DEPARTMENT_OTHER)
Admission: EM | Admit: 2023-04-05 | Discharge: 2023-04-05 | Disposition: A | Discharge status: Home / Self Care | Attending: Emergency Medicine | Admitting: Emergency Medicine

## 2023-04-05 ENCOUNTER — Other Ambulatory Visit: Payer: Self-pay

## 2023-04-05 ENCOUNTER — Encounter (HOSPITAL_BASED_OUTPATIENT_CLINIC_OR_DEPARTMENT_OTHER): Payer: Self-pay

## 2023-04-05 ENCOUNTER — Telehealth

## 2023-04-05 DIAGNOSIS — R140 Abdominal distension (gaseous): Secondary | ICD-10-CM | POA: Insufficient documentation

## 2023-04-05 DIAGNOSIS — B3731 Acute candidiasis of vulva and vagina: Secondary | ICD-10-CM

## 2023-04-05 DIAGNOSIS — Z79899 Other long term (current) drug therapy: Secondary | ICD-10-CM | POA: Insufficient documentation

## 2023-04-05 DIAGNOSIS — R350 Frequency of micturition: Secondary | ICD-10-CM

## 2023-04-05 DIAGNOSIS — N3000 Acute cystitis without hematuria: Secondary | ICD-10-CM

## 2023-04-05 LAB — URINALYSIS, ROUTINE W REFLEX MICROSCOPIC
Bilirubin Urine: NEGATIVE
Glucose, UA: NEGATIVE mg/dL
Hgb urine dipstick: NEGATIVE
Ketones, ur: NEGATIVE mg/dL
Nitrite: NEGATIVE
Protein, ur: NEGATIVE mg/dL
Specific Gravity, Urine: 1.015 (ref 1.005–1.030)
pH: 7 (ref 5.0–8.0)

## 2023-04-05 LAB — COMPREHENSIVE METABOLIC PANEL
ALT: 24 U/L (ref 0–44)
AST: 20 U/L (ref 15–41)
Albumin: 4 g/dL (ref 3.5–5.0)
Alkaline Phosphatase: 61 U/L (ref 38–126)
Anion gap: 9 (ref 5–15)
BUN: 12 mg/dL (ref 6–20)
CO2: 25 mmol/L (ref 22–32)
Calcium: 8.7 mg/dL — ABNORMAL LOW (ref 8.9–10.3)
Chloride: 102 mmol/L (ref 98–111)
Creatinine, Ser: 0.78 mg/dL (ref 0.44–1.00)
GFR, Estimated: >60 mL/min (ref >60)
Glucose, Bld: 113 mg/dL — ABNORMAL HIGH (ref 70–99)
Potassium: 3.1 mmol/L — ABNORMAL LOW (ref 3.5–5.1)
Sodium: 136 mmol/L (ref 135–145)
Total Bilirubin: 1.5 mg/dL — ABNORMAL HIGH (ref 0.0–1.2)
Total Protein: 7.3 g/dL (ref 6.5–8.1)

## 2023-04-05 LAB — URINALYSIS, MICROSCOPIC (REFLEX)

## 2023-04-05 LAB — CBC
HCT: 41.5 % (ref 36.0–46.0)
Hemoglobin: 13.9 g/dL (ref 12.0–15.0)
MCH: 26.8 pg (ref 26.0–34.0)
MCHC: 33.5 g/dL (ref 30.0–36.0)
MCV: 80.1 fL (ref 80.0–100.0)
Platelets: 329 10*3/uL (ref 150–400)
RBC: 5.18 MIL/uL — ABNORMAL HIGH (ref 3.87–5.11)
RDW: 14.1 % (ref 11.5–15.5)
WBC: 10 10*3/uL (ref 4.0–10.5)
nRBC: 0 % (ref 0.0–0.2)

## 2023-04-05 LAB — TROPONIN I (HIGH SENSITIVITY)
Troponin I (High Sensitivity): 3 ng/L (ref <18)
Troponin I (High Sensitivity): 2 ng/L (ref <18)

## 2023-04-05 LAB — LIPASE, BLOOD: Lipase: 24 U/L (ref 11–51)

## 2023-04-05 MED ORDER — FLUCONAZOLE 200 MG PO TABS: 200.0000 mg | ORAL_TABLET | Freq: Once | ORAL | 0 refills | Status: AC

## 2023-04-05 MED ORDER — IOHEXOL 300 MG/ML  SOLN
100.0000 mL | Freq: Once | INTRAMUSCULAR | Status: AC | PRN
  Administered 2023-04-05: 100 mL via INTRAVENOUS

## 2023-04-05 MED ORDER — CEPHALEXIN 500 MG PO CAPS: 500.0000 mg | ORAL_CAPSULE | Freq: Three times a day (TID) | ORAL | 0 refills | Status: AC

## 2023-04-05 MED ORDER — CEPHALEXIN 250 MG PO CAPS
500.0000 mg | ORAL_CAPSULE | Freq: Once | ORAL | Status: AC
Start: 2023-04-05 — End: 2023-04-05
  Administered 2023-04-05: 500 mg via ORAL
  Filled 2023-04-05: qty 2

## 2023-04-05 NOTE — ED Provider Notes (Signed)
 3:06 PM Patient signed out to me by previous ED physician. Pt is a 53 yo female presenting for abdominal bloating and generalized abdominal pain.   P: CT abd/pelvis pending    Physical Exam  BP (!) 149/94   Pulse 72   Temp 98.7 F (37.1 C)   Resp 16   Ht 5\' 5"  (1.651 m)   Wt 83.9 kg   LMP 11/07/2019   SpO2 98%   BMI 30.79 kg/m   Physical Exam  Procedures  Procedures  ED Course / MDM    Medical Decision Making Amount and/or Complexity of Data Reviewed Labs: ordered. Radiology: ordered.  Risk Prescription drug management.   CT abdomen and pelvis demonstrates no acute process besides uterine fibroids that patient is already seeing her OB/GYN for.  Consider bleeding from the fibroids and/or from possible menopausal symptoms.  Recommended for continued follow-up with OB/GYN.  Patient also complains of urinary frequency and urgency.  Her urine does show many bacteria with 11-12 white blood cells but also has squamous cells.  There is also budding yeast seen.  Will treat for both vaginal candidiasis and urinary tract infection although vaginal candidiasis likely the culprit.  Patient in no distress and overall condition improved here in the ED. Detailed discussions were had with the patient regarding current findings, and need for close f/u with PCP or on call doctor. The patient has been instructed to return immediately if the symptoms worsen in any way for re-evaluation. Patient verbalized understanding and is in agreement with current care plan. All questions answered prior to discharge.       Edwin Dada P, DO 04/05/23 1621

## 2023-04-05 NOTE — Discharge Instructions (Signed)
 Follow with OBGYN for menopausal symptoms and uterine fibroids. Prescription for keflex for UTI  sent to pharmacy. Prescription for fluconazole for vaginal candidiasis (yeast infection) sent to pharmacy.

## 2023-04-05 NOTE — ED Notes (Signed)
Report rec'd from prev RN 

## 2023-04-05 NOTE — ED Provider Notes (Signed)
  EMERGENCY DEPARTMENT AT [facility] Provider Note   CSN: [id number] Arrival date & time: 04/05/23  1030     History  Chief Complaint  Patient presents with   Bloated    Diane Sexton is a 53 y.o. female.  HPI 53 year old female presents with abdominal distention and discomfort.  She states for the last 4 years she has been noticing progressive abdominal bloating and distention.  On and off abdominal pain.  She states she knows she is overweight but that when she looks at the distention it looks abnormal to her and she feels like there is something else going on such as fluid or something wrong with her liver.  No fevers, vomiting, urinary symptoms.  She has inconsistent bowel movements.  Home Medications Prior to Admission medications   Medication Sig Start Date End Date Taking? Authorizing Provider  atorvastatin (LIPITOR) 20 MG tablet Take 1 tablet (20 mg total) by mouth daily. 01/25/23   Sandford Craze, NP  lisinopril (ZESTRIL) 10 MG tablet Take 1 tablet (10 mg total) by mouth daily. 01/25/23   Sandford Craze, NP      Allergies    Oxycodone, Cyclobenzaprine, Hydrocodone-apap-dietary prod, Ibuprofen, and Tylenol [acetaminophen]    Review of Systems   Review of Systems  Constitutional:  Negative for fever.  Gastrointestinal:  Positive for abdominal distention and abdominal pain. Negative for diarrhea and vomiting.  Genitourinary:  Negative for dysuria.    Physical Exam Updated Vital Signs BP (!) 149/94   Pulse 72   Temp 98.7 F (37.1 C)   Resp 16   Ht 5\' 5"  (1.651 m)   Wt 83.9 kg   LMP 11/07/2019   SpO2 98%   BMI 30.79 kg/m  Physical Exam Vitals and nursing note reviewed.  Constitutional:      Appearance: She is well-developed.  HENT:     Head: Normocephalic and atraumatic.  Cardiovascular:     Rate and Rhythm: Normal rate and regular rhythm.     Heart sounds: Normal heart sounds.  Pulmonary:     Effort: Pulmonary effort  is normal.     Breath sounds: Normal breath sounds.  Abdominal:     Palpations: Abdomen is soft.     Tenderness: There is abdominal tenderness (mild, mostly upper).  Skin:    General: Skin is warm and dry.  Neurological:     Mental Status: She is alert.     ED Results / Procedures / Treatments   Labs (all labs ordered are listed, but only abnormal results are displayed) Labs Reviewed  COMPREHENSIVE METABOLIC PANEL - Abnormal; Notable for the following components:      Result Value   Potassium 3.1 (*)    Glucose, Bld 113 (*)    Calcium 8.7 (*)    Total Bilirubin 1.5 (*)    All other components within normal limits  CBC - Abnormal; Notable for the following components:   RBC 5.18 (*)    All other components within normal limits  URINALYSIS, ROUTINE W REFLEX MICROSCOPIC - Abnormal; Notable for the following components:   APPearance HAZY (*)    Leukocytes,Ua TRACE (*)    All other components within normal limits  URINALYSIS, MICROSCOPIC (REFLEX) - Abnormal; Notable for the following components:   Bacteria, UA MANY (*)    All other components within normal limits  LIPASE, BLOOD  TROPONIN I (HIGH SENSITIVITY)  TROPONIN I (HIGH SENSITIVITY)    EKG EKG Interpretation Date/Time:  Tuesday April 05 2023  10:52:35 EST Ventricular Rate:  76 PR Interval:  162 QRS Duration:  63 QT Interval:  531 QTC Calculation: 598 R Axis:   71  Text Interpretation: Sinus rhythm Low voltage, precordial leads Nonspecific T abnrm, anterolateral leads Prolonged QT interval similar to Oct 2024 Confirmed by Pricilla Loveless [phone]) on 04/05/2023 11:20:40 AM  Radiology No results found.  Procedures Procedures    Medications Ordered in ED Medications  iohexol (OMNIPAQUE) 300 MG/ML solution 100 mL (100 mLs Intravenous Contrast Given 04/05/23 1234)    ED Course/ Medical Decision Making/ A&P                                 Medical Decision Making Amount and/or Complexity of Data Reviewed Labs:  ordered. Radiology: ordered.  Risk Prescription drug management.   Urine appears contaminated, has no urinary symptoms I do not think this is a UTI.  CT has been ordered and is currently pending.  Benign abdominal exam. Care transferred to Dr. Wallace Cullens.        Final Clinical Impression(s) / ED Diagnoses Final diagnoses:  None    Rx / DC Orders ED Discharge Orders     None         Pricilla Loveless, MD 04/05/23 1510

## 2023-04-05 NOTE — ED Triage Notes (Signed)
 C/o abdominal bloating x 3 years but worsening. Intermittent epigastric discomfort. States last BM was last night. Had some pain in left chest last night.

## 2023-04-05 NOTE — ED Notes (Signed)
 Discharge instructions reviewed with patient. Patient verbalizes understanding, no further questions at this time. Medications/prescriptions and follow up information provided. No acute distress noted at time of departure.

## 2023-04-12 ENCOUNTER — Ambulatory Visit: Payer: Medicaid Other

## 2023-04-15 ENCOUNTER — Telehealth: Admitting: Nurse Practitioner

## 2023-04-15 DIAGNOSIS — H1033 Unspecified acute conjunctivitis, bilateral: Secondary | ICD-10-CM

## 2023-04-15 MED ORDER — OFLOXACIN 0.3 % OP SOLN: 1.0000 [drp] | Freq: Four times a day (QID) | OPHTHALMIC | 0 refills | Status: AC

## 2023-04-15 NOTE — Progress Notes (Signed)

## 2023-04-20 ENCOUNTER — Telehealth: Admitting: Family Medicine

## 2023-04-20 ENCOUNTER — Telehealth: Payer: Self-pay | Admitting: Family Medicine

## 2023-04-20 DIAGNOSIS — B351 Tinea unguium: Secondary | ICD-10-CM

## 2023-04-20 MED ORDER — CICLOPIROX 8 % EX SOLN: Freq: Every day | CUTANEOUS | 0 refills | Status: DC

## 2023-04-20 NOTE — Progress Notes (Signed)
 Virtual Visit Consent   Diane Sexton, you are scheduled for a virtual visit with a [facility] provider today. Just as with appointments in the office, your consent must be obtained to participate. Your consent will be active for this visit and any virtual visit you may have with one of our providers in the next 365 days. If you have a MyChart account, a copy of this consent can be sent to you electronically.  As this is a virtual visit, video technology does not allow for your provider to perform a traditional examination. This may limit your provider's ability to fully assess your condition. If your provider identifies any concerns that need to be evaluated in person or the need to arrange testing (such as labs, EKG, etc.), we will make arrangements to do so. Although advances in technology are sophisticated, we cannot ensure that it will always work on either your end or our end. If the connection with a video visit is poor, the visit may have to be switched to a telephone visit. With either a video or telephone visit, we are not always able to ensure that we have a secure connection.  By engaging in this virtual visit, you consent to the provision of healthcare and authorize for your insurance to be billed (if applicable) for the services provided during this visit. Depending on your insurance coverage, you may receive a charge related to this service.  I need to obtain your verbal consent now. Are you willing to proceed with your visit today? Enedelia L Mccadden has provided verbal consent on 04/20/2023 for a virtual visit (video or telephone). Freddy Finner, NP  Date: 04/20/2023 12:32 PM   Virtual Visit via Video Note   I, Freddy Finner, connected with  Diane Sexton  ([id number], [birth year]) on 04/20/23 at 12:30 PM EDT by a video-enabled telemedicine application and verified that I am speaking with the correct person using two identifiers.  Location: Patient: Virtual Visit  Location Patient: Home Provider: Virtual Visit Location Provider: Home Office   I discussed the limitations of evaluation and management by telemedicine and the availability of in person appointments. The patient expressed understanding and agreed to proceed.    History of Present Illness: Diane Sexton is a 53 y.o. who identifies as a female who was assigned female at birth, and is being seen today for toe nail fungus  Right side of toe nail- great toe- is yellow and thickening up. Tries to cut it down and it does not improved.  No spreading to other toes.  Denies injuries or trauma.  Has not tried anything as of yet.   Problems:  Patient Active Problem List   Diagnosis Date Noted   Nonintractable headache 03/01/2023   Dry cough 03/01/2023   Fibroids 01/25/2023   Depression    Diabetes type 2, controlled (HCC)    MDD (major depressive disorder), recurrent episode, moderate (HCC) 11/01/2022   Housing instability 11/01/2022   Food insecurity 11/01/2022   Severe major depression without psychotic features (HCC) 10/08/2022   Generalized anxiety disorder 10/08/2022   Hypertension 04/22/2022   Pelvic pain 04/22/2022   Fibroid uterus 04/22/2022   History of abnormal cervical Pap smear 03/05/2020   Chest pain, atypical 11/18/2010   Hyperlipidemia 06/09/2009   Low back pain 04/28/2009    Allergies:  Allergies  Allergen Reactions   Oxycodone Other (See Comments)    Upset stomach   Cyclobenzaprine Nausea Only   Hydrocodone-Apap-Dietary Prod Nausea And Vomiting  Ibuprofen Other (See Comments)    Pt states she can't tolerate    Tylenol [Acetaminophen] Nausea Only   Medications:  Current Outpatient Medications:    atorvastatin (LIPITOR) 20 MG tablet, Take 1 tablet (20 mg total) by mouth daily., Disp: 90 tablet, Rfl: 3   lisinopril (ZESTRIL) 10 MG tablet, Take 1 tablet (10 mg total) by mouth daily., Disp: 90 tablet, Rfl: 1   ofloxacin (OCUFLOX) 0.3 % ophthalmic solution,  Place 1 drop into both eyes 4 (four) times daily for 5 days., Disp: 5 mL, Rfl: 0  Observations/Objective: Patient is well-developed, well-nourished in no acute distress.  Resting comfortably  at home.  Head is normocephalic, atraumatic.  No labored breathing.  Speech is clear and coherent with logical content.  Patient is alert and oriented at baseline.  Noted great toe right side of that nail bed is raised and yellow  Assessment and Plan:  1. Nail fungus (Primary)  - ciclopirox (PENLAC) 8 % solution; Apply topically at bedtime. Apply over nail and surrounding skin. Apply daily over previous coat. After seven (7) days, may remove with alcohol and continue cycle.  Dispense: 6.6 mL; Refill: 0  -follow instructions on packaging -follow up with PCP for podiatry referral  Reviewed side effects, risks and benefits of medication.    Patient acknowledged agreement and understanding of the plan.   Past Medical, Surgical, Social History, Allergies, and Medications have been Reviewed.     Reviewed side effects, risks and benefits of medication.    Patient acknowledged agreement and understanding of the plan.   Past Medical, Surgical, Social History, Allergies, and Medications have been Reviewed.     Follow Up Instructions: I discussed the assessment and treatment plan with the patient. The patient was provided an opportunity to ask questions and all were answered. The patient agreed with the plan and demonstrated an understanding of the instructions.  A copy of instructions were sent to the patient via MyChart unless otherwise noted below.    The patient was advised to call back or seek an in-person evaluation if the symptoms worsen or if the condition fails to improve as anticipated.    Freddy Finner, NP

## 2023-04-20 NOTE — Telephone Encounter (Signed)
 Last OV 03/28/23 Next OV 05/23/23  Lidocaine Patches not on current med list. Forwarding to Dr. Denyse Amass.

## 2023-04-20 NOTE — Patient Instructions (Addendum)
 Gaige L Spurling, thank you for joining Freddy Finner, NP for today's virtual visit.  While this provider is not your primary care provider (PCP), if your PCP is located in our provider database this encounter information will be shared with them immediately following your visit.   A [facility] MyChart account gives you access to today's visit and all your visits, tests, and labs performed at [facility] " click here if you don't have a [facility] MyChart account or go to [email or web address]  Consent: (Patient) Diane Sexton provided verbal consent for this virtual visit at the beginning of the encounter.  Current Medications:  Current Outpatient Medications:    ciclopirox (PENLAC) 8 % solution, Apply topically at bedtime. Apply over nail and surrounding skin. Apply daily over previous coat. After seven (7) days, may remove with alcohol and continue cycle., Disp: 6.6 mL, Rfl: 0   atorvastatin (LIPITOR) 20 MG tablet, Take 1 tablet (20 mg total) by mouth daily., Disp: 90 tablet, Rfl: 3   lisinopril (ZESTRIL) 10 MG tablet, Take 1 tablet (10 mg total) by mouth daily., Disp: 90 tablet, Rfl: 1   ofloxacin (OCUFLOX) 0.3 % ophthalmic solution, Place 1 drop into both eyes 4 (four) times daily for 5 days., Disp: 5 mL, Rfl: 0   Medications ordered in this encounter:  Meds ordered this encounter  Medications   ciclopirox (PENLAC) 8 % solution    Sig: Apply topically at bedtime. Apply over nail and surrounding skin. Apply daily over previous coat. After seven (7) days, may remove with alcohol and continue cycle.    Dispense:  6.6 mL    Refill:  0    Supervising Provider:   Merrilee Jansky [[id number]]     *If you need refills on other medications prior to your next appointment, please contact your pharmacy*  Follow-Up: Call back or seek an in-person evaluation if the symptoms worsen or if the condition fails to improve as anticipated.  [facility] [phone]  Other Instructions   Fungal Nail Infection A fungal nail infection is a common infection of the toenails or fingernails. This condition affects toenails more often than fingernails. It often affects the great, or big, toes. More than one nail may be infected. The condition can be passed from person to person (is contagious). What are the causes? This condition is caused by a fungus, such as yeast or molds. Several types of fungi can cause the infection. These fungi are common in moist and warm areas. If your hands or feet come into contact with the fungus, it may get into a crack in your fingernail or toenail or in the surrounding skin, and cause an infection. What increases the risk? The following factors may make you more likely to develop this condition: Being of older age. Having certain medical conditions, such as: Athlete's foot. Diabetes. Poor circulation. A weak body defense system (immune system). Walking barefoot in areas where the fungus thrives, such as showers or locker rooms. Wearing shoes and socks that cause your feet to sweat. Having a nail injury or a recent nail surgery. What are the signs or symptoms? Symptoms of this condition include: A pale spot on the nail. Thickening of the nail. A nail that becomes yellow, brown, or white. A brittle or ragged nail edge. A nail that has lifted away from the nail bed. How is this diagnosed? This condition is diagnosed with a physical exam. Your health care provider may take a scraping  or clipping from your nail to test for the fungus. How is this treated? Treatment is not needed for mild infections. If you have significant nail changes, treatment may include: Antifungal medicines taken by mouth (orally). You may need to take the medicine for several weeks or several months, and you may not see the results for a long time. These medicines can cause side effects. Ask your health care provider what problems to watch  for. Antifungal nail polish or nail cream. These may be used along with oral antifungal medicines. Laser treatment of the nail. Surgery to remove the nail. This may be needed for the most severe infections. It can take a long time, usually up to a year, for the infection to go away. The infection may also come back. Follow these instructions at home: Medicines Take or apply over-the-counter and prescription medicines only as told by your health care provider. Ask your health care provider about using over-the-counter mentholated ointment on your nails. Nail care Trim your nails often. Wash and dry your hands and feet every day. Keep your feet dry. To do this: Wear absorbent socks, and change your socks frequently. Wear shoes that allow air to circulate, such as sandals or canvas tennis shoes. Throw out old shoes. If you go to a nail salon, make sure you choose one that uses clean instruments. Use antifungal foot powder on your feet and in your shoes. General instructions Do not share personal items, such as towels or nail clippers. Do not walk barefoot in shower rooms or locker rooms. Wear rubber gloves if you are working with your hands in wet areas. Keep all follow-up visits. This is important. Contact a health care provider if: You have redness, pain, or pus near the toenail or fingernail. Your infection is not getting better, or it is getting worse after several months. You have more circulation problems near the toenail or fingernail. You have brown or black discoloration of the nail that spreads to the surrounding skin. Summary A fungal nail infection is a common infection of the toenails or fingernails. Treatment is not needed for mild infections. If you have significant nail changes, treatment may include taking medicine orally and applying medicine to your nails. It can take a long time, usually up to a year, for the infection to go away. The infection may also come back. Take  or apply over-the-counter and prescription medicines only as told by your health care provider. This information is not intended to replace advice given to you by your health care provider. Make sure you discuss any questions you have with your health care provider. Document Revised: 04/21/2020 Document Reviewed: 04/21/2020 Elsevier Patient Education  2024 Elsevier Inc.  If you have been instructed to have an in-person evaluation today at a local Urgent Care facility, please use the link below. It will take you to a list of all of our available [facility], including address, phone number and hours of operation. Please do not delay care.  [facility]  If you or a family member do not have a primary care provider, use the link below to schedule a visit and establish care. When you choose a [facility] primary care physician or advanced practice provider, you gain a long-term partner in health. Find a Primary Care Provider  Learn more about [facility] in-office and virtual care options: [facility] Get Care Now

## 2023-04-20 NOTE — Telephone Encounter (Signed)
 Patient called asking if Dr Denyse Amass would be able to send in Lidocaine patches for her to [facility].

## 2023-04-21 ENCOUNTER — Ambulatory Visit: Payer: Self-pay | Admitting: Physical Therapy

## 2023-04-21 MED ORDER — LIDOCAINE 5 % EX PTCH: 1.0000 | MEDICATED_PATCH | Freq: Two times a day (BID) | CUTANEOUS | 2 refills | Status: DC

## 2023-04-21 NOTE — Telephone Encounter (Signed)
 Prior Authorization initiated for LIDO PATCH via [email or web address] KEY: [id number]

## 2023-04-21 NOTE — Telephone Encounter (Signed)
 Called and spoke with patient to let her know that I will attempt prior authorization but have not had much luck getting it approved in the past without a dx of post-herpatic neuralgia or a neurologic pain. I also offered GoodRx and SingleCare savings cards as an option. Pt verbalized understanding and states that cost is still a little too much even with savings card. Will attempt prior auth and notify pt of outcome.

## 2023-04-21 NOTE — Addendum Note (Signed)
 Addended by: Rodolph Bong on: 04/21/2023 06:44 AM   Modules accepted: Orders

## 2023-04-21 NOTE — Telephone Encounter (Signed)
Patches sent

## 2023-04-21 NOTE — Telephone Encounter (Signed)
 Patient called stating that this is needing a PA.

## 2023-04-25 NOTE — Telephone Encounter (Signed)
 Prior Authorization for Lidocaine Patch DENIED

## 2023-04-26 ENCOUNTER — Ambulatory Visit: Payer: Self-pay | Admitting: Physical Therapy

## 2023-04-29 ENCOUNTER — Telehealth: Admitting: Nurse Practitioner

## 2023-04-29 DIAGNOSIS — R3989 Other symptoms and signs involving the genitourinary system: Secondary | ICD-10-CM | POA: Diagnosis not present

## 2023-04-29 DIAGNOSIS — B379 Candidiasis, unspecified: Secondary | ICD-10-CM | POA: Diagnosis not present

## 2023-04-29 DIAGNOSIS — T3695XA Adverse effect of unspecified systemic antibiotic, initial encounter: Secondary | ICD-10-CM

## 2023-04-29 MED ORDER — CEPHALEXIN 500 MG PO CAPS: 500.0000 mg | ORAL_CAPSULE | Freq: Three times a day (TID) | ORAL | 0 refills | Status: AC

## 2023-04-29 MED ORDER — FLUCONAZOLE 150 MG PO TABS: 150.0000 mg | ORAL_TABLET | Freq: Once | ORAL | 0 refills | Status: AC

## 2023-04-29 NOTE — Progress Notes (Signed)
 Virtual Visit Consent   Diane Sexton, you are scheduled for a virtual visit with a [facility] provider today. Just as with appointments in the office, your consent must be obtained to participate. Your consent will be active for this visit and any virtual visit you may have with one of our providers in the next 365 days. If you have a MyChart account, a copy of this consent can be sent to you electronically.  As this is a virtual visit, video technology does not allow for your provider to perform a traditional examination. This may limit your provider's ability to fully assess your condition. If your provider identifies any concerns that need to be evaluated in person or the need to arrange testing (such as labs, EKG, etc.), we will make arrangements to do so. Although advances in technology are sophisticated, we cannot ensure that it will always work on either your end or our end. If the connection with a video visit is poor, the visit may have to be switched to a telephone visit. With either a video or telephone visit, we are not always able to ensure that we have a secure connection.  By engaging in this virtual visit, you consent to the provision of healthcare and authorize for your insurance to be billed (if applicable) for the services provided during this visit. Depending on your insurance coverage, you may receive a charge related to this service.  I need to obtain your verbal consent now. Are you willing to proceed with your visit today? Diane Sexton has provided verbal consent on 04/29/2023 for a virtual visit (video or telephone). Viviano Simas, FNP  Date: 04/29/2023 7:07 PM   Virtual Visit via Video Note   I, Viviano Simas, connected with  Diane Sexton  ([id number], 11-17-1970) on 04/29/23 at  7:15 PM EDT by a video-enabled telemedicine application and verified that I am speaking with the correct person using two identifiers.  Location: Patient: Virtual Visit Location  Patient: Home Provider: Virtual Visit Location Provider: Home Office   I discussed the limitations of evaluation and management by telemedicine and the availability of in person appointments. The patient expressed understanding and agreed to proceed.    History of Present Illness: Diane Sexton is a 53 y.o. who identifies as a female who was assigned female at birth, and is being seen today for pressure and pain in her lower abdomen that she feels is around her bladder   This started 2 hours ago  She urinated prior to leaving work without any burning or urgency  She does feel she needs to use the bathroom now with urgency   She was seen earlier this month in the ED with abdominal pain  Was diagnosed with UTI and vaginal yeast infection  No urine culture was sent   Patient states that she was only able to take the Diflucan and was not able to afford the medicine for the UTI at that time Symptoms improved after diflucan but returned today   She says that she does not routinely hold her urine  She does drink a lot of water   Denies any recent changes to medications   Her last menstraul cycle was three years ago  CT abdomen from ED evidences uterine fibroid   Problems:  Patient Active Problem List   Diagnosis Date Noted   Nonintractable headache 03/01/2023   Dry cough 03/01/2023   Fibroids 01/25/2023   Depression    Diabetes type 2, controlled (HCC)  MDD (major depressive disorder), recurrent episode, moderate (HCC) 11/01/2022   Housing instability 11/01/2022   Food insecurity 11/01/2022   Severe major depression without psychotic features (HCC) 10/08/2022   Generalized anxiety disorder 10/08/2022   Hypertension 04/22/2022   Pelvic pain 04/22/2022   Fibroid uterus 04/22/2022   History of abnormal cervical Pap smear 03/05/2020   Chest pain, atypical 11/18/2010   Hyperlipidemia 06/09/2009   Low back pain 04/28/2009    Allergies:  Allergies  Allergen Reactions    Oxycodone Other (See Comments)    Upset stomach   Cyclobenzaprine Nausea Only   Hydrocodone-Apap-Dietary Prod Nausea And Vomiting   Ibuprofen Other (See Comments)    Pt states she can't tolerate    Tylenol [Acetaminophen] Nausea Only   Medications:  Current Outpatient Medications:    atorvastatin (LIPITOR) 20 MG tablet, Take 1 tablet (20 mg total) by mouth daily., Disp: 90 tablet, Rfl: 3   ciclopirox (PENLAC) 8 % solution, Apply topically at bedtime. Apply over nail and surrounding skin. Apply daily over previous coat. After seven (7) days, may remove with alcohol and continue cycle., Disp: 6.6 mL, Rfl: 0   lidocaine (LIDODERM) 5 %, Place 1 patch onto the skin every 12 (twelve) hours. Remove & Discard patch within 12 hours or as directed by MD, Disp: 30 patch, Rfl: 2   lisinopril (ZESTRIL) 10 MG tablet, Take 1 tablet (10 mg total) by mouth daily., Disp: 90 tablet, Rfl: 1  Observations/Objective: Patient is well-developed, well-nourished in no acute distress.  Resting comfortably  at home.  Head is normocephalic, atraumatic.  No labored breathing.  Speech is clear and coherent with logical content.  Patient is alert and oriented at baseline.    Assessment and Plan:  1. Suspected UTI Follow up with PCP next week to discuss results from ED and for follow up UA   2. Antibiotic-induced yeast infection  Meds ordered this encounter  Medications   cephALEXin (KEFLEX) 500 MG capsule    Sig: Take 1 capsule (500 mg total) by mouth 3 (three) times daily for 7 days.    Dispense:  21 capsule    Refill:  0   fluconazole (DIFLUCAN) 150 MG tablet    Sig: Take 1 tablet (150 mg total) by mouth once for 1 dose.    Dispense:  1 tablet    Refill:  0     Follow Up Instructions: I discussed the assessment and treatment plan with the patient. The patient was provided an opportunity to ask questions and all were answered. The patient agreed with the plan and demonstrated an understanding of the  instructions.  A copy of instructions were sent to the patient via MyChart unless otherwise noted below.     The patient was advised to call back or seek an in-person evaluation if the symptoms worsen or if the condition fails to improve as anticipated.    Viviano Simas, FNP

## 2023-05-03 ENCOUNTER — Encounter: Payer: Self-pay | Admitting: Physical Therapy

## 2023-05-10 ENCOUNTER — Encounter: Payer: Self-pay | Admitting: Physical Therapy

## 2023-05-23 ENCOUNTER — Ambulatory Visit: Payer: Medicaid Other | Admitting: Family Medicine

## 2023-07-01 ENCOUNTER — Ambulatory Visit: Admitting: Family

## 2023-07-20 ENCOUNTER — Ambulatory Visit: Admitting: Family

## 2023-08-19 ENCOUNTER — Telehealth: Payer: Self-pay

## 2023-08-19 ENCOUNTER — Ambulatory Visit (INDEPENDENT_AMBULATORY_CARE_PROVIDER_SITE_OTHER): Admitting: Family

## 2023-08-19 ENCOUNTER — Other Ambulatory Visit (HOSPITAL_COMMUNITY): Payer: Self-pay

## 2023-08-19 VITALS — BP 155/100 | HR 93 | Temp 98.3°F | Resp 16 | Ht 65.0 in | Wt 189.0 lb

## 2023-08-19 DIAGNOSIS — M549 Dorsalgia, unspecified: Secondary | ICD-10-CM

## 2023-08-19 DIAGNOSIS — M5414 Radiculopathy, thoracic region: Secondary | ICD-10-CM

## 2023-08-19 DIAGNOSIS — E785 Hyperlipidemia, unspecified: Secondary | ICD-10-CM

## 2023-08-19 DIAGNOSIS — E119 Type 2 diabetes mellitus without complications: Secondary | ICD-10-CM

## 2023-08-19 DIAGNOSIS — K5909 Other constipation: Secondary | ICD-10-CM

## 2023-08-19 DIAGNOSIS — I10 Essential (primary) hypertension: Secondary | ICD-10-CM

## 2023-08-19 DIAGNOSIS — Z7985 Long-term (current) use of injectable non-insulin antidiabetic drugs: Secondary | ICD-10-CM

## 2023-08-19 DIAGNOSIS — G8929 Other chronic pain: Secondary | ICD-10-CM

## 2023-08-19 LAB — LIPID PANEL
Cholesterol: 271 mg/dL — ABNORMAL HIGH (ref <200)
HDL: 52 mg/dL (ref >=50)
LDL Cholesterol (Calc): 181 mg/dL — ABNORMAL HIGH
Non-HDL Cholesterol (Calc): 219 mg/dL — ABNORMAL HIGH (ref <130)
Total CHOL/HDL Ratio: 5.2 (calc) — ABNORMAL HIGH (ref <5.0)
Triglycerides: 215 mg/dL — ABNORMAL HIGH (ref <150)

## 2023-08-19 LAB — MICROALBUMIN / CREATININE URINE RATIO
Creatinine,U: 90 mg/dL
Microalb Creat Ratio: 15.3 mg/g (ref 0.0–30.0)
Microalb, Ur: 1.4 mg/dL (ref 0.0–1.9)

## 2023-08-19 LAB — COMPREHENSIVE METABOLIC PANEL WITH GFR
ALT: 43 U/L — ABNORMAL HIGH (ref 0–35)
AST: 36 U/L (ref 0–37)
Albumin: 4.2 g/dL (ref 3.5–5.2)
Alkaline Phosphatase: 77 U/L (ref 39–117)
BUN: 12 mg/dL (ref 6–23)
CO2: 29 meq/L (ref 19–32)
Calcium: 9.6 mg/dL (ref 8.4–10.5)
Chloride: 101 meq/L (ref 96–112)
Creatinine, Ser: 0.74 mg/dL (ref 0.40–1.20)
GFR: 91.07 mL/min (ref >60.00)
Glucose, Bld: 164 mg/dL — ABNORMAL HIGH (ref 70–99)
Potassium: 3.3 meq/L — ABNORMAL LOW (ref 3.5–5.1)
Sodium: 139 meq/L (ref 135–145)
Total Bilirubin: 0.8 mg/dL (ref 0.2–1.2)
Total Protein: 7.1 g/dL (ref 6.0–8.3)

## 2023-08-19 LAB — HEMOGLOBIN A1C: Hgb A1c MFr Bld: 7.1 % — ABNORMAL HIGH (ref 4.6–6.5)

## 2023-08-19 MED ORDER — TIRZEPATIDE 2.5 MG/0.5ML ~~LOC~~ SOAJ: 2.5000 mg | SUBCUTANEOUS | 2 refills | Status: DC

## 2023-08-19 MED ORDER — LISINOPRIL 20 MG PO TABS: 20.0000 mg | ORAL_TABLET | Freq: Every day | ORAL | 3 refills | Status: DC

## 2023-08-19 MED ORDER — LIDOCAINE 4 % EX PTCH: 1.0000 | MEDICATED_PATCH | CUTANEOUS | 2 refills | Status: DC

## 2023-08-19 MED ORDER — ATORVASTATIN CALCIUM 20 MG PO TABS: 20.0000 mg | ORAL_TABLET | Freq: Every day | ORAL | 1 refills | Status: DC

## 2023-08-19 NOTE — Telephone Encounter (Signed)
 Pharmacy Patient Advocate Encounter   Received notification from Onbase that prior authorization for Mounjaro 2.5MG /0.5ML auto-injectors  is required/requested.   Insurance verification completed.   The patient is insured through [employer] .   Per test claim: PA required; PA submitted to above mentioned insurance via CoverMyMeds Key/confirmation #/EOC [id number] Status is pending

## 2023-08-19 NOTE — Assessment & Plan Note (Addendum)
 Lab Results  Component Value Date   HGBA1C 6.9 (H) 01/25/2023   HGBA1C 6.0 (H) 12/16/2017   Lab Results  Component Value Date   LDLCALC 188 (H) 01/25/2023   CREATININE 0.78 04/05/2023  Trial of mounjaro to help with weight loss and glycemic control.  Discussed common side effects.

## 2023-08-19 NOTE — Assessment & Plan Note (Signed)
 Recommend titration of daily miralax, increased fruits/veggies/water.

## 2023-08-19 NOTE — Assessment & Plan Note (Addendum)
 BP Readings from Last 3 Encounters:  08/19/23 (!) 155/100  04/05/23 (!) 147/90  03/28/23 (!) 146/96   BP uncontrolled. Increase lisinopril  to 20mg .

## 2023-08-19 NOTE — Telephone Encounter (Signed)
 Pharmacy Patient Advocate Encounter  Received notification from [employer] that Prior Authorization for Mounjaro 2.5MG /0.5ML auto-injectors  has been APPROVED from 08/19/23 to 08/18/24. Ran test claim, Copay is $25. This test claim was processed through [facility]- copay amounts may vary at other pharmacies due to pharmacy/plan contracts, or as the patient moves through the different stages of their insurance plan.   PA #/Case ID/Reference #: [id number]

## 2023-08-19 NOTE — Patient Instructions (Signed)
 VISIT SUMMARY:  During your visit, we discussed your concerns about upper left chest tingling and bloating. We reviewed your hypertension, diabetes, and cholesterol management, and made some adjustments to your medications and lifestyle recommendations. We also addressed your nerve pain and general health maintenance needs.  YOUR PLAN:  BLOATING: You have significant bloating that may be due to dairy or gluten sensitivity, along with weight gain and post-menopausal changes. -Eliminate dairy from your diet for two weeks. If there's no improvement, eliminate gluten for two weeks. -Focus on weight loss through diet and exercise.  HYPERTENSION: Your blood pressure is slightly elevated. -Increase your lisinopril  dosage to 20 mg daily. -Recheck your blood pressure in two weeks with the nurse. -Check your potassium levels at your follow-up.  TYPE 2 DIABETES MELLITUS: Your A1c level is slightly above the target. -Order A1c and kidney function tests. -Consider Mounjaro for weight loss and glycemic control, pending insurance approval. -Schedule an eye exam and send the report to your primary care doctor.  HYPERLIPIDEMIA: Your cholesterol levels were previously high, and you are on atorvastatin . -Order a cholesterol test. -Continue taking atorvastatin .  NERVE PAIN: You have an intermittent cool, tingly sensation in your upper left chest likely due to nerve irritation. -Prescribe lidocaine  patches for your back pain.  GENERAL HEALTH MAINTENANCE: We discussed your vaccinations, bowel movements, hydration, colonoscopy follow-up, and eye exam due to diabetes. -Encourage regular bowel movements with Miralax and increased water intake. -Consider pneumonia and hepatitis B vaccinations in future visits. -Review your colonoscopy records from 2022. -Schedule an eye exam.  FOLLOW-UP: We planned follow-up visits to monitor your blood pressure, diabetes, and overall health. -Schedule a follow-up with the  nurse in two weeks for a blood pressure check. -Schedule a follow-up with your primary care doctor in three months. -Complete lab work today for A1c, cholesterol, kidney function, and urine protein.

## 2023-08-19 NOTE — Assessment & Plan Note (Signed)
 Lab Results  Component Value Date   CHOL 270 (H) 01/25/2023   HDL 44.90 01/25/2023   LDLCALC 188 (H) 01/25/2023   LDLDIRECT 174 (H) 12/24/2009   TRIG 188.0 (H) 01/25/2023   CHOLHDL 6 01/25/2023  Update lipid panel- on atorvastatin .

## 2023-08-19 NOTE — Assessment & Plan Note (Signed)
  Intermittent cool, tingly sensation in upper left chest likely due to nerve irritation. Lidocaine  patches effective for back pain. - Prescribe lidocaine  patches for back pain.

## 2023-08-19 NOTE — Progress Notes (Signed)
 Subjective:     Patient ID: Elouise LITTIE Forth, female    DOB: 09-26-1970, 53 y.o.   MRN: [id number]  Chief Complaint  Patient presents with   [facility]    Patient feels bloated   Tingling    Patient reports feeling tingling on upper left chest area on and off    HPI  Discussed the use of AI scribe software for clinical note transcription with the patient, who gave verbal consent to proceed.  History of Present Illness Waynetta L Simones is a 53 year old female with hypertension and diabetes who presents with upper left chest tingling and bloating.  She experiences a cool, tingly sensation in the upper left chest for about a year. The sensation is superficial, intermittent, and not painful, with no apparent trigger. There are no associated breast issues or palpable lumps. She has daily back pain and wonders if the chest sensation is related.  She feels extremely bloated, describing her abdomen as looking 'seven months pregnant,' especially after eating. Certain foods worsen the bloating, but specific triggers are unidentified. She is concerned about her weight, currently at 189 pounds with a BMI of 31.4, and desires weight loss. She experiences constipation and uses Miralax intermittently. She drinks a lot of water and is considering dietary changes.  She takes lisinopril  10 mg for hypertension and atorvastatin  for high cholesterol. Her last A1c level was 6.9. She has a family history of diabetes. 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      Health Maintenance Due  Topic Date Due   FOOT EXAM  Never done   OPHTHALMOLOGY EXAM  Never done   Diabetic kidney evaluation - Urine ACR  Never done   DTaP/Tdap/Td (1 - Tdap) Never done   Pneumococcal Vaccine 27-66 Years old (1 of 2 - PCV) Never done   Hepatitis B Vaccines (1 of 3 - 19+ 3-dose series) Never done   Colonoscopy  Never done   Zoster Vaccines- Shingrix (1 of 2) Never  done   COVID-19 Vaccine (4 - 2024-25 season) 10/03/2022   HEMOGLOBIN A1C  07/26/2023    Past Medical History:  Diagnosis Date   Constipation    Depression    bi-polar   Diabetes type 2, controlled (HCC)    DUB (dysfunctional uterine bleeding)    Fibroids    History of trichomoniasis 2018   Hyperlipemia    Hypertension    PMDD (premenstrual dysphoric disorder)     Past Surgical History:  Procedure Laterality Date   APPENDECTOMY  2009   COLOMBIA  2008    Family History  Problem Relation Age of Onset   Hypertension Mother    Neuropathy Mother    Hypertension Father    Diabetes Mellitus II Father    Diabetes Sister        pre-diabetic   Diabetes Mellitus II Brother    Dementia Maternal Grandmother    Heart attack Maternal Grandmother 48   Diabetes Mellitus II Paternal Grandmother    Diabetes Mellitus II Paternal Aunt    Diabetes Mellitus II Paternal Aunt    Diabetes Mellitus II Paternal Uncle    Diabetes Mellitus II Paternal Uncle     Social History   Socioeconomic History   Marital status: Single    Spouse name: Not on file   Number of children: 0   Years of education: Not on file   Highest education level: Associate degree: academic  program  Occupational History   Occupation: Agricultural engineer: [employer]  Tobacco Use   Smoking status: Never   Smokeless tobacco: Never  Vaping Use   Vaping status: Never Used  Substance and Sexual Activity   Alcohol use: Not Currently    Alcohol/week: 1.0 standard drink of alcohol    Types: 1 Standard drinks or equivalent per week   Drug use: No   Sexual activity: Not Currently    Partners: Female, Female    Birth control/protection: None  Other Topics Concern   Not on file  Social History Narrative   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)   Lives with mother   No children   Single, never married   Completed associates, she is in school for [employer] in Criminal Justice/Human  services   Enjoys walking, reading, writing   No pets   Social Drivers of Health   Financial Resource Strain: Medium Risk (08/19/2023)   Overall Financial Resource Strain (CARDIA)    Difficulty of Paying Living Expenses: Somewhat hard  Food Insecurity: Food Insecurity Present (08/19/2023)   Hunger Vital Sign    Worried About Running Out of Food in the Last Year: Sometimes true    Ran Out of Food in the Last Year: Sometimes true  Transportation Needs: No Transportation Needs (08/19/2023)   PRAPARE - Administrator, Civil Service (Medical): No    Lack of Transportation (Non-Medical): No  Physical Activity: Inactive (08/19/2023)   Exercise Vital Sign    Days of Exercise per Week: 0 days    Minutes of Exercise per Session: Not on file  Stress: Stress Concern Present (08/19/2023)   [employer] of Occupational Health - Occupational Stress Questionnaire    Feeling of Stress: To some extent  Social Connections: Moderately Isolated (08/19/2023)   Social Connection and Isolation Panel    Frequency of Communication with Friends and Family: More than three times a week    Frequency of Social Gatherings with Friends and Family: Once a week    Attends Religious Services: More than 4 times per year    Active Member of [employer] or Organizations: No    Attends Engineer, structural: Not on file    Marital Status: Never married  Intimate Partner Violence: Not At Risk (10/08/2022)   Humiliation, Afraid, Rape, and Kick questionnaire    Fear of Current or Ex-Partner: No    Emotionally Abused: No    Physically Abused: No    Sexually Abused: No    Outpatient Medications Prior to Visit  Medication Sig Dispense Refill   atorvastatin  (LIPITOR) 20 MG tablet Take 1 tablet (20 mg total) by mouth daily. 90 tablet 3   lisinopril  (ZESTRIL ) 10 MG tablet Take 1 tablet (10 mg total) by mouth daily. 90 tablet 1   ciclopirox  (PENLAC ) 8 % solution Apply topically at bedtime. Apply over nail and  surrounding skin. Apply daily over previous coat. After seven (7) days, may remove with alcohol and continue cycle. 6.6 mL 0   lidocaine  (LIDODERM ) 5 % Place 1 patch onto the skin every 12 (twelve) hours. Remove & Discard patch within 12 hours or as directed by MD 30 patch 2   No facility-administered medications prior to visit.    Allergies  Allergen Reactions   Oxycodone  Other (See Comments)    Upset stomach   Cyclobenzaprine  Nausea Only   Hydrocodone -Apap-Dietary Prod Nausea And Vomiting   Ibuprofen  Other (See  Comments)    Pt states she can't tolerate    Tylenol  [Acetaminophen ] Nausea Only    ROS See HPI    Objective:    Physical Exam Constitutional:      General: She is not in acute distress.    Appearance: Normal appearance. She is well-developed.  HENT:     Head: Normocephalic and atraumatic.     Right Ear: External ear normal.     Left Ear: External ear normal.  Eyes:     General: No scleral icterus. Neck:     Thyroid : No thyromegaly.  Cardiovascular:     Rate and Rhythm: Normal rate and regular rhythm.     Heart sounds: Normal heart sounds. No murmur heard. Pulmonary:     Effort: Pulmonary effort is normal. No respiratory distress.     Breath sounds: Normal breath sounds. No wheezing.  Musculoskeletal:     Cervical back: Neck supple.  Skin:    General: Skin is warm and dry.  Neurological:     Mental Status: She is alert and oriented to person, place, and time.  Psychiatric:        Mood and Affect: Mood normal.        Behavior: Behavior normal.        Thought Content: Thought content normal.        Judgment: Judgment normal.      BP (!) 149/94 (BP Location: Right Arm, Patient Position: Sitting, Cuff Size: Large)   Pulse 93   Temp 98.3 F (36.8 C) (Oral)   Resp 16   Ht 5' 5 (1.651 m)   Wt 189 lb (85.7 kg)   LMP 11/07/2019   SpO2 98%   BMI 31.45 kg/m  Wt Readings from Last 3 Encounters:  08/19/23 189 lb (85.7 kg)  04/05/23 185 lb (83.9 kg)   03/28/23 185 lb (83.9 kg)       Assessment & Plan:   Problem List Items Addressed This Visit       Unprioritized   Thoracic radiculopathy    Intermittent cool, tingly sensation in upper left chest likely due to nerve irritation. Lidocaine  patches effective for back pain. - Prescribe lidocaine  patches for back pain.      Hypertension - Primary   BP Readings from Last 3 Encounters:  08/19/23 (!) 149/94  04/05/23 (!) 147/90  03/28/23 (!) 146/96   BP uncontrolled. Increase lisinopril  to 20mg .        Relevant Medications   lisinopril  (ZESTRIL ) 20 MG tablet   atorvastatin  (LIPITOR) 20 MG tablet   Hyperlipidemia   Lab Results  Component Value Date   CHOL 270 (H) 01/25/2023   HDL 44.90 01/25/2023   LDLCALC 188 (H) 01/25/2023   LDLDIRECT 174 (H) 12/24/2009   TRIG 188.0 (H) 01/25/2023   CHOLHDL 6 01/25/2023  Update lipid panel- on atorvastatin .        Relevant Medications   lisinopril  (ZESTRIL ) 20 MG tablet   atorvastatin  (LIPITOR) 20 MG tablet   Other Relevant Orders   Lipid panel   Diabetes type 2, controlled (HCC)   Lab Results  Component Value Date   HGBA1C 6.9 (H) 01/25/2023   HGBA1C 6.0 (H) 12/16/2017   Lab Results  Component Value Date   LDLCALC 188 (H) 01/25/2023   CREATININE 0.78 04/05/2023         Relevant Medications   lisinopril  (ZESTRIL ) 20 MG tablet   tirzepatide (MOUNJARO) 2.5 MG/0.5ML Pen   atorvastatin  (LIPITOR) 20 MG tablet   Other  Relevant Orders   HgB A1c   Urine Microalbumin w/creat. ratio   Comp Met (CMET)   Other Visit Diagnoses       Chronic back pain, unspecified back location, unspecified back pain laterality       Relevant Medications   lidocaine  (HM LIDOCAINE  PATCH) 4 %      General Health Maintenance Discussed vaccinations, bowel movements, hydration, colonoscopy follow-up, and eye exam due to diabetes. - Encourage regular bowel movements with Miralax and increased water intake. - Consider pneumonia and hepatitis B  vaccinations in future visits. - Review colonoscopy records from 2022. - Schedule eye exam.  I have discontinued Nabeeha L. Riggin's lisinopril , ciclopirox , and lidocaine . I am also having her start on lisinopril , tirzepatide, and lidocaine . Additionally, I am having her maintain her atorvastatin .  Meds ordered this encounter  Medications   lisinopril  (ZESTRIL ) 20 MG tablet    Sig: Take 1 tablet (20 mg total) by mouth daily.    Dispense:  90 tablet    Refill:  3    Supervising Provider:   DOMENICA BLACKBIRD A [[id number]]   tirzepatide (MOUNJARO) 2.5 MG/0.5ML Pen    Sig: Inject 2.5 mg into the skin once a week.    Dispense:  2 mL    Refill:  2    Supervising Provider:   DOMENICA BLACKBIRD A [[id number]]   atorvastatin  (LIPITOR) 20 MG tablet    Sig: Take 1 tablet (20 mg total) by mouth daily.    Dispense:  90 tablet    Refill:  1    Supervising Provider:   DOMENICA BLACKBIRD A [[id number]]   lidocaine  (HM LIDOCAINE  PATCH) 4 %    Sig: Place 1 patch onto the skin daily.    Dispense:  30 patch    Refill:  2    Supervising Provider:   DOMENICA BLACKBIRD A [[id number]]

## 2023-08-20 ENCOUNTER — Telehealth: Payer: Self-pay | Admitting: Family

## 2023-08-20 ENCOUNTER — Ambulatory Visit: Payer: Self-pay | Admitting: Family

## 2023-08-20 DIAGNOSIS — E876 Hypokalemia: Secondary | ICD-10-CM

## 2023-08-20 DIAGNOSIS — E785 Hyperlipidemia, unspecified: Secondary | ICD-10-CM

## 2023-08-20 MED ORDER — ATORVASTATIN CALCIUM 40 MG PO TABS: 40.0000 mg | ORAL_TABLET | Freq: Every day | ORAL | 1 refills | Status: DC

## 2023-08-20 MED ORDER — POTASSIUM CHLORIDE CRYS ER 20 MEQ PO TBCR: 20.0000 meq | EXTENDED_RELEASE_TABLET | Freq: Every day | ORAL | 3 refills | Status: DC

## 2023-08-20 NOTE — Telephone Encounter (Signed)
 Colo results are available in [facility] records.  Could you please abstract to HCM? thanks

## 2023-08-20 NOTE — Telephone Encounter (Addendum)
 Cholesterol is very high.  I just resent a new rx for lipitor 40mg  instead of 20mg .  If she already picked up the 20's she can take two tabs daily until they run out.    A1C up a bit at 7.1.  It looks like her insurance approved the Mounjaro  which is great news.  One of her liver tests is mildly elevated- most likely cause is fatty liver.  Weight loss and diet can help this.  Potassium is a bit low. This is not new for her.  I would like for her to add a potassium supplement once daily and repeat bmet in 1 week.

## 2023-08-22 ENCOUNTER — Telehealth: Payer: Self-pay

## 2023-08-22 NOTE — Telephone Encounter (Signed)
 Results printed for scanning and abstracted

## 2023-08-22 NOTE — Telephone Encounter (Signed)
 Patient notified of results, medication dose increase, comments and recommendations. Patient has appointment 8/01 for nurse visit and labs, she prefers to have potasium check at that time.

## 2023-08-22 NOTE — Telephone Encounter (Signed)
 Called patient back and talked to her

## 2023-08-22 NOTE — Telephone Encounter (Signed)
 Copied from CRM [id number]. Topic: Clinical - Lab/Test Results >> Aug 22, 2023  1:29 PM Abigail D wrote: Reason for CRM: Patient is returning Ruth's call for lab results, advised patient that Levorn could give her a call back. She is requesting someone give a call back at 2:30 pm that is the only time she will be available to receive this call.

## 2023-08-22 NOTE — Telephone Encounter (Signed)
 Called patient but no answer, left voice mail for patient to call back.

## 2023-08-30 ENCOUNTER — Other Ambulatory Visit

## 2023-09-02 ENCOUNTER — Ambulatory Visit: Admitting: Neurology

## 2023-09-02 ENCOUNTER — Other Ambulatory Visit (INDEPENDENT_AMBULATORY_CARE_PROVIDER_SITE_OTHER)

## 2023-09-02 DIAGNOSIS — I10 Essential (primary) hypertension: Secondary | ICD-10-CM

## 2023-09-02 DIAGNOSIS — E785 Hyperlipidemia, unspecified: Secondary | ICD-10-CM | POA: Diagnosis not present

## 2023-09-02 NOTE — Addendum Note (Signed)
 Addended by: TRUDY CURVIN RAMAN on: 09/02/2023 02:27 PM   Modules accepted: Orders

## 2023-09-02 NOTE — Progress Notes (Signed)
 Patient comes in today for blood pressure check.   Diane Sexton should be taking lisinopril  20 mg daily for blood pressure control (increased from lisinopril  10 mg at last visit). Nanea did not increase her dosage of blood pressure medication. I scheduled another visit in 2 weeks to evaluate her on the increased dosage. Advised her she can take Lisinopril  10 mg - two tablets until she picks up increased dosage. She expressed understanding. No charged this visit.

## 2023-09-03 LAB — BASIC METABOLIC PANEL WITH GFR
BUN: 14 mg/dL (ref 7–25)
CO2: 26 mmol/L (ref 20–32)
Calcium: 9.6 mg/dL (ref 8.6–10.4)
Chloride: 103 mmol/L (ref 98–110)
Creat: 0.78 mg/dL (ref 0.50–1.03)
Glucose, Bld: 122 mg/dL — ABNORMAL HIGH (ref 65–99)
Potassium: 3.2 mmol/L — ABNORMAL LOW (ref 3.5–5.3)
Sodium: 143 mmol/L (ref 135–146)
eGFR: 90 mL/min/1.73m2 (ref >=60)

## 2023-09-05 ENCOUNTER — Ambulatory Visit: Payer: Self-pay | Admitting: Family

## 2023-09-05 DIAGNOSIS — E876 Hypokalemia: Secondary | ICD-10-CM

## 2023-09-05 NOTE — Telephone Encounter (Signed)
 Potassium is a bit low.  Please take 2 tabs today, then one tab once daily. Repeat bmet in 1 week.

## 2023-09-21 ENCOUNTER — Ambulatory Visit

## 2023-09-21 ENCOUNTER — Other Ambulatory Visit

## 2023-09-28 ENCOUNTER — Telehealth: Admitting: Physician Assistant

## 2023-09-28 DIAGNOSIS — R140 Abdominal distension (gaseous): Secondary | ICD-10-CM

## 2023-09-28 MED ORDER — FAMOTIDINE 20 MG PO TABS: 20.0000 mg | ORAL_TABLET | Freq: Two times a day (BID) | ORAL | 0 refills | Status: DC

## 2023-09-28 MED ORDER — SIMETHICONE 125 MG PO CHEW: 125.0000 mg | CHEWABLE_TABLET | Freq: Four times a day (QID) | ORAL | 0 refills | Status: DC | PRN

## 2023-09-28 NOTE — Progress Notes (Signed)
 [name], Diane Sexton are scheduled for a virtual visit with your provider today.    Just as we do with appointments in the office, we must obtain your consent to participate.  Your consent will be active for this visit and any virtual visit you may have with one of our providers in the next 365 days.    If you have a MyChart account, I can also send a copy of this consent to you electronically.  All virtual visits are billed to your insurance company just like a traditional visit in the office.  As this is a virtual visit, video technology does not allow for your provider to perform a traditional examination.  This may limit your provider's ability to fully assess your condition.  If your provider identifies any concerns that need to be evaluated in person or the need to arrange testing such as labs, EKG, etc, we will make arrangements to do so.    Although advances in technology are sophisticated, we cannot ensure that it will always work on either your end or our end.  If the connection with a video visit is poor, we may have to switch to a telephone visit.  With either a video or telephone visit, we are not always able to ensure that we have a secure connection.   I need to obtain your verbal consent now.   Are you willing to proceed with your visit today?   Diane Sexton has provided verbal consent on 09/28/2023 for a virtual visit (video or telephone).   Lynden GORMAN Snuffer, [place] 09/28/2023  5:31 PM   Date:  09/28/2023   ID:  Diane Sexton, DOB 1970-08-04, MRN [id number]  Patient Location: Home Provider Location: Home Office   Participants: Patient and Provider for Visit and Wrap up  Method of visit: Video  Location of Patient: Home Location of Provider: Home Office Consent was obtain for visit over the video. Services rendered by provider: Visit was performed via video  A video enabled telemedicine application was used and I verified that I am speaking with the correct person  using two identifiers.  PCP:  Daryl Setter, NP   Chief Complaint:  abd pain  History of Present Illness:    Diane Sexton is a 53 y.o. female with history as stated below. Presents video telehealth for an acute care visit  Pt states that she has had abd bloating for months. States that she has had some epigastric discomfort and nausea after bending down or if something is pressure up against her stomach. She also reports that certain foods make her have heartburn.  Denies vomiting. She has had some constipation.  Past Medical, Surgical, Social History, Allergies, and Medications have been Reviewed.  Past Medical History:  Diagnosis Date   Constipation    Depression    bi-polar   Diabetes type 2, controlled (HCC)    DUB (dysfunctional uterine bleeding)    Fibroids    History of trichomoniasis 2018   Hyperlipemia    Hypertension    PMDD (premenstrual dysphoric disorder)     No outpatient medications have been marked as taking for the 09/28/23 encounter (Appointment) with [facility] PROVIDER.     Allergies:   Oxycodone , Cyclobenzaprine , Hydrocodone -apap-dietary prod, Ibuprofen , and Tylenol  [acetaminophen ]   ROS See HPI for history of present illness.  Physical Exam Constitutional:      Appearance: Normal appearance.  Neurological:     Mental Status: She is alert.  MDM: Pt with abd bloating for several months with nausea with eating. Previously had CT 04/2023 which was reassuring. Sxs sound consistent with reflux though would consider gastritis, h pylori, pud.  May benefit from eval with GI. Will tx with pepcid  simethicone    Tests Ordered: No orders of the defined types were placed in this encounter.   Medication Changes: No orders of the defined types were placed in this encounter.    Disposition:  Follow up  Signed, Lynden GORMAN Snuffer, PA-C  09/28/2023 5:31 PM

## 2023-09-28 NOTE — Patient Instructions (Signed)
  Paxtyn L Gawlik, thank you for joining [employer], PA-C for today's virtual visit.  While this provider is not your primary care provider (PCP), if your PCP is located in our provider database this encounter information will be shared with them immediately following your visit.   A [facility] MyChart account gives you access to today's visit and all your visits, tests, and labs performed at [facility]  click here if you don't have a [facility] MyChart account or go to [email or web address]  Consent: (Patient) Diane Sexton provided verbal consent for this virtual visit at the beginning of the encounter.  Current Medications:  Current Outpatient Medications:    famotidine  (PEPCID ) 20 MG tablet, Take 1 tablet (20 mg total) by mouth 2 (two) times daily for 14 days., Disp: 28 tablet, Rfl: 0   simethicone  (MYLICON) 125 MG chewable tablet, Chew 1 tablet (125 mg total) by mouth every 6 (six) hours as needed for up to 14 days for flatulence., Disp: 14 tablet, Rfl: 0   atorvastatin  (LIPITOR) 40 MG tablet, Take 1 tablet (40 mg total) by mouth daily., Disp: 90 tablet, Rfl: 1   lidocaine  (HM LIDOCAINE  PATCH) 4 %, Place 1 patch onto the skin daily., Disp: 30 patch, Rfl: 2   lisinopril  (ZESTRIL ) 20 MG tablet, Take 1 tablet (20 mg total) by mouth daily., Disp: 90 tablet, Rfl: 3   potassium chloride  SA (KLOR-CON  M) 20 MEQ tablet, Take 1 tablet (20 mEq total) by mouth daily., Disp: 30 tablet, Rfl: 3   tirzepatide  (MOUNJARO ) 2.5 MG/0.5ML Pen, Inject 2.5 mg into the skin once a week., Disp: 2 mL, Rfl: 2   Medications ordered in this encounter:  Meds ordered this encounter  Medications   famotidine  (PEPCID ) 20 MG tablet    Sig: Take 1 tablet (20 mg total) by mouth 2 (two) times daily for 14 days.    Dispense:  28 tablet    Refill:  0   simethicone  (MYLICON) 125 MG chewable tablet    Sig: Chew 1 tablet (125 mg total) by mouth every 6 (six) hours as needed for up to 14 days  for flatulence.    Dispense:  14 tablet    Refill:  0     *If you need refills on other medications prior to your next appointment, please contact your pharmacy*  Follow-Up: Call back or seek an in-person evaluation if the symptoms worsen or if the condition fails to improve as anticipated.  [facility] [phone]  Other Instructions Take pepcid  and simethicone  as directed  Follow up with your regular doctor in 1 week for reassessment and seek care sooner if your symptoms worsen or fail to improve.    If you have been instructed to have an in-person evaluation today at a local Urgent Care facility, please use the link below. It will take you to a list of all of our available [facility], including address, phone number and hours of operation. Please do not delay care.  [facility]  If you or a family member do not have a primary care provider, use the link below to schedule a visit and establish care. When you choose a [facility] primary care physician or advanced practice provider, you gain a long-term partner in health. Find a Primary Care Provider  Learn more about [facility]'s in-office and virtual care options: [facility] - Get Care Now

## 2023-10-13 ENCOUNTER — Other Ambulatory Visit (HOSPITAL_BASED_OUTPATIENT_CLINIC_OR_DEPARTMENT_OTHER): Payer: Self-pay

## 2023-10-13 ENCOUNTER — Ambulatory Visit (INDEPENDENT_AMBULATORY_CARE_PROVIDER_SITE_OTHER): Admitting: Family

## 2023-10-13 VITALS — BP 138/80 | HR 82 | Resp 18 | Ht 65.0 in | Wt 187.6 lb

## 2023-10-13 DIAGNOSIS — R140 Abdominal distension (gaseous): Secondary | ICD-10-CM | POA: Diagnosis not present

## 2023-10-13 DIAGNOSIS — R7989 Other specified abnormal findings of blood chemistry: Secondary | ICD-10-CM | POA: Diagnosis not present

## 2023-10-13 DIAGNOSIS — E119 Type 2 diabetes mellitus without complications: Secondary | ICD-10-CM

## 2023-10-13 DIAGNOSIS — E785 Hyperlipidemia, unspecified: Secondary | ICD-10-CM

## 2023-10-13 DIAGNOSIS — R202 Paresthesia of skin: Secondary | ICD-10-CM | POA: Diagnosis not present

## 2023-10-13 DIAGNOSIS — R1310 Dysphagia, unspecified: Secondary | ICD-10-CM | POA: Diagnosis not present

## 2023-10-13 DIAGNOSIS — Z7985 Long-term (current) use of injectable non-insulin antidiabetic drugs: Secondary | ICD-10-CM

## 2023-10-13 MED ORDER — PANTOPRAZOLE SODIUM 40 MG PO TBEC: 40.0000 mg | DELAYED_RELEASE_TABLET | Freq: Every day | ORAL | 0 refills | Status: DC

## 2023-10-13 MED ORDER — TIRZEPATIDE 2.5 MG/0.5ML ~~LOC~~ SOAJ
2.5000 mg | SUBCUTANEOUS | 2 refills | Status: DC
  Filled 2023-10-13: qty 2, 28d supply, fill #0

## 2023-10-13 NOTE — Assessment & Plan Note (Signed)
  Chronic bloating with epigastric discomfort and nausea, possibly related to IBS. Constipation noted. Dairy sensitivity suspected. - Recommend trial of dairy-free diet for one week to see if this helps symptoms. - Provide list of FODMAP diet.  - Referral to gastroenterology for further evaluation.

## 2023-10-13 NOTE — Assessment & Plan Note (Signed)
  Elevated liver function test (mild elevation of ALT) suggest fatty liver. Weight loss through Mounjaro  may improve condition. - Discuss potential benefits of Mounjaro  for fatty liver disease.

## 2023-10-13 NOTE — Assessment & Plan Note (Signed)
 Was very elevated last visit. Now back on statin. Update today.

## 2023-10-13 NOTE — Assessment & Plan Note (Signed)
 Never able to start mounjaro - now on new insurance. Will reorder under new insurance.  Discussed common side effects/benefits.

## 2023-10-13 NOTE — Progress Notes (Signed)
 Subjective:     Patient ID: Diane Sexton, female    DOB: [birth year], 53 y.o.   MRN: [id number]  Chief Complaint  Patient presents with   Tingling    Hands & feet --onset: 1 week    Follow-up    Abd bloating    HPI  Discussed the use of AI scribe software for clinical note transcription with the patient, who gave verbal consent to proceed.  History of Present Illness  Diane Sexton is a 53 year old female with diabetes who presents with tingling in the extremities and abdominal bloating.  She experiences tingling in her hands, arms, feet, and legs, with sharp pain at the bottom of her feet, possibly related to wearing high heels. The tingling in her hands extends up her arms and may be influenced by her sleeping position.  Abdominal bloating is significant, with epigastric discomfort and nausea after bending or applying pressure to her stomach. Certain foods cause heartburn, and she uses Miralax intermittently for constipation. Bowel movements occur every other day, with cheese consumption causing further delay. She drinks a lot of water.   She has not been taking prescribed potassium supplement.     Health Maintenance Due  Topic Date Due   OPHTHALMOLOGY EXAM  Never done   Pneumococcal Vaccine: 50+ Years (1 of 2 - PCV) Never done   Hepatitis B Vaccines 19-59 Average Risk (1 of 3 - 19+ 3-dose series) Never done   Zoster Vaccines- Shingrix (1 of 2) Never done   COVID-19 Vaccine (4 - 2025-26 season) 10/03/2023    Past Medical History:  Diagnosis Date   Constipation    Depression    bi-polar   Diabetes type 2, controlled (HCC)    DUB (dysfunctional uterine bleeding)    Fibroids    History of trichomoniasis 2018   Hyperlipemia    Hypertension    PMDD (premenstrual dysphoric disorder)     Past Surgical History:  Procedure Laterality Date   APPENDECTOMY  2009   COLOMBIA  2008    Family History  Problem Relation Age of Onset   Hypertension Mother     Neuropathy Mother    Hypertension Father    Diabetes Mellitus II Father    Diabetes Sister        pre-diabetic   Diabetes Mellitus II Brother    Dementia Maternal Grandmother    Heart attack Maternal Grandmother 48   Diabetes Mellitus II Paternal Grandmother    Diabetes Mellitus II Paternal Aunt    Diabetes Mellitus II Paternal Aunt    Diabetes Mellitus II Paternal Uncle    Diabetes Mellitus II Paternal Uncle     Social History   Socioeconomic History   Marital status: Single    Spouse name: Not on file   Number of children: 0   Years of education: Not on file   Highest education level: Associate degree: academic program  Occupational History   Occupation: Agricultural engineer: [employer]  Tobacco Use   Smoking status: Never   Smokeless tobacco: Never  Vaping Use   Vaping status: Never Used  Substance and Sexual Activity   Alcohol use: Not Currently    Alcohol/week: 1.0 standard drink of alcohol    Types: 1 Standard drinks or equivalent per week   Drug use: No   Sexual activity: Not Currently    Partners: Female, Female    Birth control/protection: None  Other Topics Concern   Not on  file  Social History Narrative   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[employer])   Lives with mother   No children   Single, never married   Completed associates, she is in school for [employer] in Criminal Justice/Human services   Enjoys walking, reading, writing   No pets   Social Drivers of Corporate investment banker Strain: Medium Risk (08/19/2023)   Overall Financial Resource Strain (CARDIA)    Difficulty of Paying Living Expenses: Somewhat hard  Food Insecurity: Food Insecurity Present (08/19/2023)   Hunger Vital Sign    Worried About Running Out of Food in the Last Year: Sometimes true    Ran Out of Food in the Last Year: Sometimes true  Transportation Needs: No Transportation Needs (08/19/2023)   PRAPARE - Administrator, Civil Service  (Medical): No    Lack of Transportation (Non-Medical): No  Physical Activity: Inactive (08/19/2023)   Exercise Vital Sign    Days of Exercise per Week: 0 days    Minutes of Exercise per Session: Not on file  Stress: Stress Concern Present (08/19/2023)   [employer] of Occupational Health - Occupational Stress Questionnaire    Feeling of Stress: To some extent  Social Connections: Moderately Isolated (08/19/2023)   Social Connection and Isolation Panel    Frequency of Communication with Friends and Family: More than three times a week    Frequency of Social Gatherings with Friends and Family: Once a week    Attends Religious Services: More than 4 times per year    Active Member of [employer] or Organizations: No    Attends Engineer, structural: Not on file    Marital Status: Never married  Intimate Partner Violence: Not At Risk (10/08/2022)   Humiliation, Afraid, Rape, and Kick questionnaire    Fear of Current or Ex-Partner: No    Emotionally Abused: No    Physically Abused: No    Sexually Abused: No    Outpatient Medications Prior to Visit  Medication Sig Dispense Refill   atorvastatin  (LIPITOR) 40 MG tablet Take 1 tablet (40 mg total) by mouth daily. 90 tablet 1   famotidine  (PEPCID ) 20 MG tablet Take 1 tablet (20 mg total) by mouth 2 (two) times daily for 14 days. 28 tablet 0   lidocaine  (HM LIDOCAINE  PATCH) 4 % Place 1 patch onto the skin daily. 30 patch 2   lisinopril  (ZESTRIL ) 20 MG tablet Take 1 tablet (20 mg total) by mouth daily. 90 tablet 3   potassium chloride  SA (KLOR-CON  M) 20 MEQ tablet Take 1 tablet (20 mEq total) by mouth daily. 30 tablet 3   simethicone  (MYLICON) 125 MG chewable tablet Chew 1 tablet (125 mg total) by mouth every 6 (six) hours as needed for up to 14 days for flatulence. 14 tablet 0   tirzepatide  (MOUNJARO ) 2.5 MG/0.5ML Pen Inject 2.5 mg into the skin once a week. 2 mL 2   No facility-administered medications prior to visit.    Allergies   Allergen Reactions   Oxycodone  Other (See Comments)    Upset stomach   Cyclobenzaprine  Nausea Only   Hydrocodone -Apap-Dietary Prod Nausea And Vomiting   Ibuprofen  Other (See Comments)    Pt states she can't tolerate    Tylenol  [Acetaminophen ] Nausea Only    ROS See HPI    Objective:    Physical Exam Constitutional:      General: She is not in acute distress.    Appearance: Normal appearance. She  is well-developed.  HENT:     Head: Normocephalic and atraumatic.     Right Ear: External ear normal.     Left Ear: External ear normal.  Eyes:     General: No scleral icterus. Neck:     Thyroid : No thyromegaly.  Cardiovascular:     Rate and Rhythm: Normal rate and regular rhythm.     Heart sounds: Normal heart sounds. No murmur heard. Pulmonary:     Effort: Pulmonary effort is normal. No respiratory distress.     Breath sounds: Normal breath sounds. No wheezing.  Musculoskeletal:     Cervical back: Neck supple.  Skin:    General: Skin is warm and dry.  Neurological:     Mental Status: She is alert and oriented to person, place, and time.  Psychiatric:        Mood and Affect: Mood normal.        Behavior: Behavior normal.        Thought Content: Thought content normal.        Judgment: Judgment normal.    Diabetic Foot Exam - Simple   Simple Foot Form Diabetic Foot exam was performed with the following findings: Yes 10/13/2023  3:53 PM  Visual Inspection No deformities, no ulcerations, no other skin breakdown bilaterally: Yes Sensation Testing Intact to touch and monofilament testing bilaterally: Yes Pulse Check Posterior Tibialis and Dorsalis pulse intact bilaterally: Yes Comments       BP 138/80   Pulse 82   Resp 18   Ht 5' 5 (1.651 m)   Wt 187 lb 9.6 oz (85.1 kg)   LMP 11/07/2019   SpO2 98%   BMI 31.22 kg/m  Wt Readings from Last 3 Encounters:  10/13/23 187 lb 9.6 oz (85.1 kg)  08/19/23 189 lb (85.7 kg)  04/05/23 185 lb (83.9 kg)        Assessment & Plan:   Problem List Items Addressed This Visit       Unprioritized   Paresthesia    Intermittent tingling in extremities. Possible causes include B12 deficiency, diabetic neuropathy, or less likely, carpal tunnel syndrome in her upper extremities. She feels that symptoms not severe enough for medication. Could consider trial of gabapentin if symptoms worsen. - Order B12 and folate levels.      Relevant Orders   B12 and Folate Panel   Hyperlipidemia   Was very elevated last visit. Now back on statin. Update today.       Relevant Orders   Lipid panel   Dysphagia   New. No improvement with pepcid . D/c pepcid , trial of pantoprazole , refer to GI for further evaluation.       Relevant Medications   pantoprazole  (PROTONIX ) 40 MG tablet   Other Relevant Orders   Ambulatory referral to Gastroenterology   Diabetes type 2, controlled (HCC)   Never able to start mounjaro - now on new insurance. Will reorder under new insurance.  Discussed common side effects/benefits.       Relevant Medications   tirzepatide  (MOUNJARO ) 2.5 MG/0.5ML Pen   Other Relevant Orders   Basic Metabolic Panel (BMET)   Urine Microalbumin w/creat. ratio   Ambulatory referral to Optometry   Abnormal LFTs - Primary    Elevated liver function test (mild elevation of ALT) suggest fatty liver. Weight loss through Mounjaro  may improve condition. - Discuss potential benefits of Mounjaro  for fatty liver disease.      Abdominal bloating    Chronic bloating with epigastric discomfort and nausea, possibly  related to IBS. Constipation noted. Dairy sensitivity suspected. - Recommend trial of dairy-free diet for one week to see if this helps symptoms. - Provide list of FODMAP diet.  - Referral to gastroenterology for further evaluation.       I am having Tocarra L. Turnley start on pantoprazole . I am also having her maintain her lisinopril , lidocaine , atorvastatin , potassium chloride  SA, famotidine ,  simethicone , and tirzepatide .  Meds ordered this encounter  Medications   pantoprazole  (PROTONIX ) 40 MG tablet    Sig: Take 1 tablet (40 mg total) by mouth daily.    Dispense:  90 tablet    Refill:  0    Supervising Provider:   DOMENICA BLACKBIRD A [[id number]]   tirzepatide  (MOUNJARO ) 2.5 MG/0.5ML Pen    Sig: Inject 2.5 mg into the skin once a week.    Dispense:  2 mL    Refill:  2    Supervising Provider:   DOMENICA BLACKBIRD A [[id number]]

## 2023-10-13 NOTE — Patient Instructions (Signed)
 VISIT SUMMARY:  Today, we addressed your diabetes management, tingling in your extremities, abdominal bloating, and other health concerns. We discussed new medications, dietary changes, and follow-up tests to help manage your symptoms and improve your overall health.  YOUR PLAN:  TYPE 2 DIABETES MELLITUS: Your current glucose level is slightly elevated. -We will resend your Mounjaro  prescription to [facility]. -Contact [facility] for a copay card and prior authorization. -We will recheck your A1c in three months.  PERIPHERAL NEUROPATHY, POSSIBLE DIABETIC ETIOLOGY: You have intermittent tingling in your extremities, which may be due to diabetic neuropathy or other causes. -We will order B12 and folate levels to check for deficiencies.  OBESITY: We discussed using Mounjaro  for weight loss. -Continue your exercise regimen. -Mounjaro  may help you lose 1-2 pounds per week.  NONALCOHOLIC FATTY LIVER DISEASE: Your elevated liver function tests suggest fatty liver disease. -Weight loss through Mounjaro  may improve your condition.  ABDOMINAL BLOATING, LIKELY IRRITABLE BOWEL SYNDROME AND CONSTIPATION: You have chronic bloating and discomfort, possibly related to IBS and constipation. -Try a dairy-free diet for one week. -Avoid high FODMAP foods. -We may refer you to gastroenterology for further evaluation.  GASTROESOPHAGEAL REFLUX DISEASE WITH DYSPHAGIA: You have a burning sensation and difficulty swallowing with certain foods, indicating GERD. -We will prescribe pantoprazole  once daily. -Consider seeing a gastroenterologist for a possible endoscopy if symptoms persist.  HYPERTENSION: Your hypertension management is ongoing.  HYPERLIPIDEMIA: Your cholesterol levels were previously high, but you are now compliant with atorvastatin . -We will order a repeat cholesterol panel.  HYPOKALEMIA, POSSIBLE: Your previous labs showed slightly low potassium. -We will order a repeat potassium level. -Consider  potassium supplementation if levels remain low.  MENOPAUSE: We discussed your menopausal symptoms, including hot flashes.  GENERAL HEALTH MAINTENANCE: Routine health maintenance was discussed, including immunizations and screenings. -We offer you a tetanus shot. -We offer you a flu shot. -We will order a urine test for proteinuria. -We will refer you to ophthalmology for an eye exam.

## 2023-10-13 NOTE — Assessment & Plan Note (Signed)
  Intermittent tingling in extremities. Possible causes include B12 deficiency, diabetic neuropathy, or less likely, carpal tunnel syndrome in her upper extremities. She feels that symptoms not severe enough for medication. Could consider trial of gabapentin if symptoms worsen. - Order B12 and folate levels.

## 2023-10-13 NOTE — Assessment & Plan Note (Signed)
 New. No improvement with pepcid . D/c pepcid , trial of pantoprazole , refer to GI for further evaluation.

## 2023-10-14 ENCOUNTER — Telehealth: Payer: Self-pay | Admitting: Family

## 2023-10-14 DIAGNOSIS — E785 Hyperlipidemia, unspecified: Secondary | ICD-10-CM

## 2023-10-14 LAB — MICROALBUMIN / CREATININE URINE RATIO
Creatinine,U: 141.2 mg/dL
Microalb Creat Ratio: 8.6 mg/g (ref 0.0–30.0)
Microalb, Ur: 1.2 mg/dL (ref 0.0–1.9)

## 2023-10-14 LAB — BASIC METABOLIC PANEL WITH GFR
BUN: 10 mg/dL (ref 6–23)
CO2: 28 meq/L (ref 19–32)
Calcium: 9.7 mg/dL (ref 8.4–10.5)
Chloride: 100 meq/L (ref 96–112)
Creatinine, Ser: 0.9 mg/dL (ref 0.40–1.20)
GFR: 71.93 mL/min (ref >60.00)
Glucose, Bld: 83 mg/dL (ref 70–99)
Potassium: 3.3 meq/L — ABNORMAL LOW (ref 3.5–5.1)
Sodium: 140 meq/L (ref 135–145)

## 2023-10-14 LAB — LIPID PANEL
Cholesterol: 212 mg/dL — ABNORMAL HIGH (ref 0–200)
HDL: 46.1 mg/dL (ref >39.00)
LDL Cholesterol: 122 mg/dL — ABNORMAL HIGH (ref 0–99)
NonHDL: 165.91
Total CHOL/HDL Ratio: 5
Triglycerides: 222 mg/dL — ABNORMAL HIGH (ref 0.0–149.0)
VLDL: 44.4 mg/dL — ABNORMAL HIGH (ref 0.0–40.0)

## 2023-10-14 LAB — B12 AND FOLATE PANEL
Folate: 7.7 ng/mL (ref >5.9)
Vitamin B-12: 273 pg/mL (ref 211–911)

## 2023-10-14 MED ORDER — ATORVASTATIN CALCIUM 80 MG PO TABS: 80.0000 mg | ORAL_TABLET | Freq: Every day | ORAL | 1 refills | Status: DC

## 2023-10-14 NOTE — Telephone Encounter (Signed)
 Potassium is low, please restart Kdur.   Cholesterol is improving but still above goal. Please increase atorvastatin  to 80 mg once weekly.

## 2023-10-14 NOTE — Telephone Encounter (Signed)
 Called patient but no answer, left voice mail for patinet to call back.  Advised to start potassium

## 2023-10-17 NOTE — Telephone Encounter (Signed)
 Patient notified of results, she reports she never took potasium when prescribed before, advised patient to start taking. She was also advised of medication dose increase.

## 2023-11-18 ENCOUNTER — Emergency Department (HOSPITAL_COMMUNITY)
Admission: EM | Admit: 2023-11-18 | Discharge: 2023-11-18 | Disposition: A | Discharge status: Home / Self Care | Attending: Emergency Medicine | Admitting: Emergency Medicine

## 2023-11-18 ENCOUNTER — Other Ambulatory Visit: Payer: Self-pay

## 2023-11-18 ENCOUNTER — Emergency Department (HOSPITAL_COMMUNITY)

## 2023-11-18 ENCOUNTER — Encounter (HOSPITAL_COMMUNITY): Payer: Self-pay | Admitting: *Deleted

## 2023-11-18 DIAGNOSIS — I10 Essential (primary) hypertension: Secondary | ICD-10-CM | POA: Diagnosis not present

## 2023-11-18 DIAGNOSIS — R079 Chest pain, unspecified: Secondary | ICD-10-CM

## 2023-11-18 DIAGNOSIS — R0789 Other chest pain: Secondary | ICD-10-CM | POA: Insufficient documentation

## 2023-11-18 DIAGNOSIS — Z79899 Other long term (current) drug therapy: Secondary | ICD-10-CM | POA: Diagnosis not present

## 2023-11-18 DIAGNOSIS — E119 Type 2 diabetes mellitus without complications: Secondary | ICD-10-CM | POA: Diagnosis not present

## 2023-11-18 LAB — COMPREHENSIVE METABOLIC PANEL WITH GFR
ALT: 47 U/L — ABNORMAL HIGH (ref 0–44)
AST: 42 U/L — ABNORMAL HIGH (ref 15–41)
Albumin: 4.5 g/dL (ref 3.5–5.0)
Alkaline Phosphatase: 78 U/L (ref 38–126)
Anion gap: 11 (ref 5–15)
BUN: 11 mg/dL (ref 6–20)
CO2: 28 mmol/L (ref 22–32)
Calcium: 9.9 mg/dL (ref 8.9–10.3)
Chloride: 100 mmol/L (ref 98–111)
Creatinine, Ser: 0.89 mg/dL (ref 0.44–1.00)
GFR, Estimated: >60 mL/min (ref >60)
Glucose, Bld: 125 mg/dL — ABNORMAL HIGH (ref 70–99)
Potassium: 3.5 mmol/L (ref 3.5–5.1)
Sodium: 139 mmol/L (ref 135–145)
Total Bilirubin: 1.5 mg/dL — ABNORMAL HIGH (ref 0.0–1.2)
Total Protein: 7.7 g/dL (ref 6.5–8.1)

## 2023-11-18 LAB — CBC WITH DIFFERENTIAL/PLATELET
Abs Immature Granulocytes: 0.05 K/uL (ref 0.00–0.07)
Basophils Absolute: 0.1 K/uL (ref 0.0–0.1)
Basophils Relative: 1 %
Eosinophils Absolute: 0.3 K/uL (ref 0.0–0.5)
Eosinophils Relative: 2 %
HCT: 42.9 % (ref 36.0–46.0)
Hemoglobin: 13.9 g/dL (ref 12.0–15.0)
Immature Granulocytes: 0 %
Lymphocytes Relative: 31 %
Lymphs Abs: 3.5 K/uL (ref 0.7–4.0)
MCH: 26.4 pg (ref 26.0–34.0)
MCHC: 32.4 g/dL (ref 30.0–36.0)
MCV: 81.6 fL (ref 80.0–100.0)
Monocytes Absolute: 0.8 K/uL (ref 0.1–1.0)
Monocytes Relative: 7 %
Neutro Abs: 6.6 K/uL (ref 1.7–7.7)
Neutrophils Relative %: 59 %
Platelets: 344 K/uL (ref 150–400)
RBC: 5.26 MIL/uL — ABNORMAL HIGH (ref 3.87–5.11)
RDW: 14.2 % (ref 11.5–15.5)
WBC: 11.3 K/uL — ABNORMAL HIGH (ref 4.0–10.5)
nRBC: 0 % (ref 0.0–0.2)

## 2023-11-18 LAB — LIPASE, BLOOD: Lipase: 25 U/L (ref 11–51)

## 2023-11-18 LAB — TROPONIN T, HIGH SENSITIVITY: Troponin T High Sensitivity: <15 ng/L (ref 0–19)

## 2023-11-18 MED ORDER — FAMOTIDINE 20 MG PO TABS: 20.0000 mg | ORAL_TABLET | Freq: Two times a day (BID) | ORAL | 0 refills | Status: DC

## 2023-11-18 NOTE — ED Provider Notes (Signed)
 [facility] AT [facility] Provider Note   CSN: [id number] Arrival date & time: 11/18/23  9251     Patient presents with: Chest Pain   Diane Sexton is a 53 y.o. female.   Patient here with left-sided chest pain intermittently for the last several months.  Patient states pain comes and goes.  Very short lasting.  Will feel somewhat tingling and cool.  She denies any recent surgery or travel.  Does not have any active pain.  She does history of high cholesterol diabetes hypertension.  She denies any exertional symptoms.  She denies any shortness of breath or cancer history.  Pain comes at random times.  Seems to maybe happen when she is lying down in bed.  Denies any cough fever chills.  The history is provided by the patient.       Prior to Admission medications   Medication Sig Start Date End Date Taking? Authorizing Provider  atorvastatin  (LIPITOR) 80 MG tablet Take 1 tablet (80 mg total) by mouth daily. 10/14/23   O'Sullivan, Melissa, NP  famotidine  (PEPCID ) 20 MG tablet Take 1 tablet (20 mg total) by mouth 2 (two) times daily for 14 days. 11/18/23 12/02/23  Tinita Brooker, DO  lidocaine  (HM LIDOCAINE  PATCH) 4 % Place 1 patch onto the skin daily. 08/19/23   O'Sullivan, Melissa, NP  lisinopril  (ZESTRIL ) 20 MG tablet Take 1 tablet (20 mg total) by mouth daily. 08/19/23   O'Sullivan, Melissa, NP  pantoprazole  (PROTONIX ) 40 MG tablet Take 1 tablet (40 mg total) by mouth daily. 10/13/23   O'Sullivan, Melissa, NP  potassium chloride  SA (KLOR-CON  M) 20 MEQ tablet Take 1 tablet (20 mEq total) by mouth daily. 08/20/23   O'Sullivan, Melissa, NP  simethicone  (MYLICON) 125 MG chewable tablet Chew 1 tablet (125 mg total) by mouth every 6 (six) hours as needed for up to 14 days for flatulence. 09/28/23 10/12/23  Couture, Cortni S, PA-C  tirzepatide  (MOUNJARO ) 2.5 MG/0.5ML Pen Inject 2.5 mg into the skin once a week. 10/13/23   O'Sullivan, Melissa, NP    Allergies:  Oxycodone , Cyclobenzaprine , Hydrocodone -apap-dietary prod, Ibuprofen , and Tylenol  [acetaminophen ]    Review of Systems  Updated Vital Signs BP (!) 168/106   Pulse 94   Temp 98.2 F (36.8 C) (Oral)   Resp 19   Ht 5' 5 (1.651 m)   Wt 82.1 kg   LMP 11/07/2019   SpO2 100%   BMI 30.12 kg/m   Physical Exam Vitals and nursing note reviewed.  Constitutional:      General: She is not in acute distress.    Appearance: She is well-developed. She is not ill-appearing.  HENT:     Head: Normocephalic and atraumatic.  Eyes:     Extraocular Movements: Extraocular movements intact.     Conjunctiva/sclera: Conjunctivae normal.     Pupils: Pupils are equal, round, and reactive to light.  Cardiovascular:     Rate and Rhythm: Normal rate and regular rhythm.     Pulses:          Radial pulses are 2+ on the right side and 2+ on the left side.     Heart sounds: Normal heart sounds. No murmur heard. Pulmonary:     Effort: Pulmonary effort is normal. No respiratory distress.     Breath sounds: Normal breath sounds. No decreased breath sounds or wheezing.  Abdominal:     Palpations: Abdomen is soft.     Tenderness: There is no abdominal tenderness.  Musculoskeletal:        General: No swelling.     Cervical back: Normal range of motion and neck supple.  Skin:    General: Skin is warm and dry.     Capillary Refill: Capillary refill takes less than 2 seconds.  Neurological:     General: No focal deficit present.     Mental Status: She is alert.  Psychiatric:        Mood and Affect: Mood normal.     (all labs ordered are listed, but only abnormal results are displayed) Labs Reviewed  CBC WITH DIFFERENTIAL/PLATELET - Abnormal; Notable for the following components:      Result Value   WBC 11.3 (*)    RBC 5.26 (*)    All other components within normal limits  COMPREHENSIVE METABOLIC PANEL WITH GFR - Abnormal; Notable for the following components:   Glucose, Bld 125 (*)    AST 42 (*)     ALT 47 (*)    Total Bilirubin 1.5 (*)    All other components within normal limits  LIPASE, BLOOD  TROPONIN T, HIGH SENSITIVITY    EKG: EKG Interpretation Date/Time:  Friday November 18 2023 07:59:30 EDT Ventricular Rate:  94 PR Interval:  149 QRS Duration:  63 QT Interval:  449 QTC Calculation: 562 R Axis:   28  Text Interpretation: Sinus rhythm Low voltage, precordial leads Confirmed by Ruthe Cornet [id number]) on 11/18/2023 8:43:00 AM  Radiology: [facility] Chest Portable 1 View Result Date: 11/18/2023 EXAM: 1 VIEW(S) XRAY OF THE CHEST 11/18/2023 09:10:00 AM COMPARISON: 11/15/2022 CLINICAL HISTORY: CP. Per chart - Here by POV from home for recurrent intermittent L sided chest discomfort. Described as cool, prickly, tingling sensation. Comes and goes. No aggravating or alleviating factors. Onset > 6 months ago. Has been seen in ED and by PCP for the ; same. Rates 10/10 when it comes. Mentions sharp chest/heart pain on Saturday a week ago. FINDINGS: LUNGS AND PLEURA: No focal pulmonary opacity. No pulmonary edema. No pleural effusion. No pneumothorax. HEART AND MEDIASTINUM: No acute abnormality of the cardiac and mediastinal silhouettes. BONES AND SOFT TISSUES: No acute osseous abnormality. IMPRESSION: 1. No acute cardiopulmonary abnormality. Electronically signed by: Lynwood Seip MD 11/18/2023 09:26 AM EDT RP Workstation: [id number]     Procedures   Medications Ordered in the ED - No data to display                                  Medical Decision Making Amount and/or Complexity of Data Reviewed Labs: ordered. Radiology: ordered.   Diane Sexton is here left-sided chest pain last several months.  Overall atypical sounding pain for PE ACS.  She has had multiple cardiac risk factors with hypertension high cholesterol diabetes but overall story more concerning for reflux related process or MSK related process or some other sort of stressor.  She has no PE risk factors.  Wells  criteria 0 and doubt PE.  EKG shows sinus rhythm per my review and interpretation.  No ischemic changes.  Overall and somewhat chronic nature of her discomfort.  Is not having any pain now.  Pain is nonexertional.  Seems to be when she is lying flat in bed at times.  Overall we will get CBC troponin chest x-ray CMP and lipase.  At this time she is very well-appearing.  Have no concern for dissection or other acute process.  Overall lab  work per my review and interpretation shows no significant leukocytosis anemia or electrolyte abnormality.  Troponins normal.  Chest x-ray no evidence of pneumonia or pneumothorax per my review and interpretation.  Overall I do think that this could be stress related process or GI related process or MSK related process.  This pain has been somewhat chronic.  She does have some cardiac risk factors however we will refer her to cardiology for outpatient follow-up.  Do not think she needs a repeat troponin today.  Pain was over 2 hours ago very short-lived.  Overall I think she stable for discharge.  Will have her restart Pepcid  and follow-up with primary care and cardiology.  Understands return precautions.  This chart was dictated using voice recognition software.  Despite best efforts to proofread,  errors can occur which can change the documentation meaning.      Final diagnoses:  Nonspecific chest pain    ED Discharge Orders          Ordered    Ambulatory referral to Cardiology       Comments: If you have not heard from the Cardiology office within the next 72 hours please call [phone].   11/18/23 1013    famotidine  (PEPCID ) 20 MG tablet  2 times daily        11/18/23 1013               Jackson, Juliene, DO 11/18/23 1015

## 2023-11-18 NOTE — ED Triage Notes (Addendum)
 Here by POV from home for recurrent intermittent L sided chest discomfort. Described as cool, prickly, tingling sensation. Comes and goes. No aggravating or alleviating factors. Onset > 6 months ago. Has been seen in ED and by PCP for the same. Rates 10/10 when it comes. Mentions sharp chest/heart pain on Saturday a week ago. Denies NVD, fever, cough, rash, syncope, sob, URI sx, or injury/fall. Aklert, NAD, calm, interactive, steady gait.

## 2023-11-18 NOTE — Discharge Instructions (Signed)
 Return things worsen.  Take medication as prescribed.  Follow-up with your primary care doctor and cardiology.

## 2023-11-22 ENCOUNTER — Encounter: Admitting: Family

## 2023-12-13 ENCOUNTER — Telehealth: Payer: Self-pay

## 2023-12-13 NOTE — Telephone Encounter (Signed)
 Prior Authorization initiated for Lido Patch 5% via [email or web address] KEY: [id number]

## 2023-12-14 ENCOUNTER — Other Ambulatory Visit (HOSPITAL_COMMUNITY): Payer: Self-pay

## 2023-12-14 ENCOUNTER — Telehealth: Payer: Self-pay

## 2023-12-14 DIAGNOSIS — E119 Type 2 diabetes mellitus without complications: Secondary | ICD-10-CM

## 2023-12-14 MED ORDER — OZEMPIC (0.25 OR 0.5 MG/DOSE) 2 MG/1.5ML ~~LOC~~ SOPN: 0.2500 mg | PEN_INJECTOR | SUBCUTANEOUS | 1 refills | Status: DC

## 2023-12-14 NOTE — Telephone Encounter (Signed)
 Needs PA for Mounjaro  2.5mg  please. (Notification just received today from [facility])

## 2023-12-14 NOTE — Telephone Encounter (Signed)
 Prior Authorization for LIDOCAINE  PATCH 5% APPROVED PA# [id number] Valid: 12/13/23-12/12/24

## 2023-12-14 NOTE — Telephone Encounter (Signed)
 Diane Sexton, Diane Sexton, CPhT to Me (Selected Message)     12/14/23  2:34 PM Good afternoon. The patient has [place] medicaid plan and Mounjaro  is non-preferred. The preferred meds are Byetta, Trulicity, Victoza, and Ozempic. The patient will need [place] trial and failure of 2 of the preferred before they will pay for the Mounjaro .

## 2023-12-14 NOTE — Telephone Encounter (Signed)
 Please cancel Ozempic at pharmacy. Pt changed her mind.

## 2023-12-15 ENCOUNTER — Other Ambulatory Visit: Payer: Self-pay

## 2023-12-15 NOTE — Telephone Encounter (Signed)
 LMOM at [facility] provider line to cancel Ozempic prescription. Med also d/c from med list.

## 2023-12-19 NOTE — Telephone Encounter (Signed)
 Please have someone contact pt when new referral has been placed,

## 2023-12-28 ENCOUNTER — Ambulatory Visit (INDEPENDENT_AMBULATORY_CARE_PROVIDER_SITE_OTHER): Admitting: Family

## 2023-12-28 VITALS — BP 133/84 | HR 80 | Temp 99.1°F | Resp 16 | Ht 65.0 in | Wt 188.4 lb

## 2023-12-28 DIAGNOSIS — E785 Hyperlipidemia, unspecified: Secondary | ICD-10-CM | POA: Diagnosis not present

## 2023-12-28 DIAGNOSIS — Z0000 Encounter for general adult medical examination without abnormal findings: Secondary | ICD-10-CM

## 2023-12-28 DIAGNOSIS — M25551 Pain in right hip: Secondary | ICD-10-CM | POA: Diagnosis not present

## 2023-12-28 DIAGNOSIS — E876 Hypokalemia: Secondary | ICD-10-CM | POA: Diagnosis not present

## 2023-12-28 DIAGNOSIS — T7491XA Unspecified adult maltreatment, confirmed, initial encounter: Secondary | ICD-10-CM | POA: Insufficient documentation

## 2023-12-28 DIAGNOSIS — Q670 Congenital facial asymmetry: Secondary | ICD-10-CM | POA: Diagnosis not present

## 2023-12-28 DIAGNOSIS — M25552 Pain in left hip: Secondary | ICD-10-CM | POA: Diagnosis not present

## 2023-12-28 DIAGNOSIS — I10 Essential (primary) hypertension: Secondary | ICD-10-CM | POA: Diagnosis not present

## 2023-12-28 DIAGNOSIS — Z0001 Encounter for general adult medical examination with abnormal findings: Secondary | ICD-10-CM

## 2023-12-28 DIAGNOSIS — D72829 Elevated white blood cell count, unspecified: Secondary | ICD-10-CM

## 2023-12-28 DIAGNOSIS — E119 Type 2 diabetes mellitus without complications: Secondary | ICD-10-CM

## 2023-12-28 DIAGNOSIS — G8929 Other chronic pain: Secondary | ICD-10-CM

## 2023-12-28 DIAGNOSIS — M549 Dorsalgia, unspecified: Secondary | ICD-10-CM

## 2023-12-28 DIAGNOSIS — E538 Deficiency of other specified B group vitamins: Secondary | ICD-10-CM

## 2023-12-28 MED ORDER — LIDOCAINE 4 % EX PTCH: 1.0000 | MEDICATED_PATCH | CUTANEOUS | 2 refills | Status: AC

## 2023-12-28 MED ORDER — ATORVASTATIN CALCIUM 80 MG PO TABS
80.0000 mg | ORAL_TABLET | Freq: Every day | ORAL | 1 refills | Status: DC
Start: 2023-12-28 — End: 2023-12-31

## 2023-12-28 MED ORDER — LISINOPRIL 20 MG PO TABS
20.0000 mg | ORAL_TABLET | Freq: Every day | ORAL | 1 refills | Status: AC
Start: 2023-12-28 — End: ?

## 2023-12-28 MED ORDER — OZEMPIC (0.25 OR 0.5 MG/DOSE) 2 MG/1.5ML ~~LOC~~ SOPN: PEN_INJECTOR | SUBCUTANEOUS | 2 refills | Status: AC

## 2023-12-28 MED ORDER — POTASSIUM CHLORIDE CRYS ER 20 MEQ PO TBCR: 20.0000 meq | EXTENDED_RELEASE_TABLET | Freq: Every day | ORAL | 1 refills | Status: AC

## 2023-12-28 NOTE — Assessment & Plan Note (Signed)
 This is very concerning and she feels trapped. Recommend that she speak with social worker to discuss safer temporary housing options.  Referral placed.

## 2023-12-28 NOTE — Assessment & Plan Note (Signed)
  Routine wellness visit with lifestyle modification discussion. Declined vaccines. - Continue regular exercise at [employer] and workplace gym. - Continue weight loss efforts.  - Maintain low-salt diet and reduce red meat intake. - Encouraged increased consumption of chicken and fish. - Encouraged reduction of starches and sweets. - Mammo/Pap/Colo up to date.

## 2023-12-28 NOTE — Assessment & Plan Note (Signed)
(  Low normal b12)    Intermittent tingling with low-normal B12 levels. - Recommended B12 oral 500 mcg daily for 1-2 months.
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
## 2023-12-28 NOTE — Assessment & Plan Note (Addendum)
 Lab Results  Component Value Date   CHOL 212 (H) 10/13/2023   HDL 46.10 10/13/2023   LDLCALC 122 (H) 10/13/2023   LDLDIRECT 174 (H) 12/24/2009   TRIG 222.0 (H) 10/13/2023   CHOLHDL 5 10/13/2023   Cholesterol slightly elevated despite atorvastatin . Discussed statin benefits and side effects. - Continue atorvastatin  40 mg oral daily. - Ordered lipid panel.

## 2023-12-28 NOTE — Assessment & Plan Note (Addendum)
 A1c at 7.1, slightly above goal. Discussed weight loss medications for glycemic control and addressed safety concerns. - Prescribed Ozempic  0.25 mg subcutaneous weekly, increase to 0.5 mg if tolerated. - Monitor for side effects such as nausea and constipation. - Ordered A1c test. -Recommended that she schedule DM eye exam.

## 2023-12-28 NOTE — Assessment & Plan Note (Addendum)
  Possible nerve-related issues such as trigeminal neuralgia or Bell's palsy. She is not sure if this is new for her or not.  - Referred to neurology for evaluation.
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
## 2023-12-28 NOTE — Assessment & Plan Note (Addendum)
 BP Readings from Last 3 Encounters:  12/28/23 133/84  11/18/23 136/83  10/13/23 138/80    Blood pressure managed with lisinopril , beneficial for kidney protection in diabetes. Discussed side effects. - Continue lisinopril  20 mg oral daily. - Monitor blood pressure and potassium levels.

## 2023-12-29 LAB — LIPID PANEL
Cholesterol: 164 mg/dL (ref <200)
HDL: 50 mg/dL (ref >=50)
LDL Cholesterol (Calc): 93 mg/dL
Non-HDL Cholesterol (Calc): 114 mg/dL (ref <130)
Total CHOL/HDL Ratio: 3.3 (calc) (ref <5.0)
Triglycerides: 109 mg/dL (ref <150)

## 2023-12-29 LAB — HEMOGLOBIN A1C
Hgb A1c MFr Bld: 7.2 % — ABNORMAL HIGH (ref <5.7)
Mean Plasma Glucose: 160 mg/dL
eAG (mmol/L): 8.9 mmol/L

## 2023-12-29 LAB — COMPREHENSIVE METABOLIC PANEL WITH GFR
AG Ratio: 1.8 (calc) (ref 1.0–2.5)
ALT: 137 U/L — ABNORMAL HIGH (ref 6–29)
AST: 80 U/L — ABNORMAL HIGH (ref 10–35)
Albumin: 4.4 g/dL (ref 3.6–5.1)
Alkaline phosphatase (APISO): 98 U/L (ref 37–153)
BUN: 12 mg/dL (ref 7–25)
CO2: 29 mmol/L (ref 20–32)
Calcium: 9.5 mg/dL (ref 8.6–10.4)
Chloride: 102 mmol/L (ref 98–110)
Creat: 0.79 mg/dL (ref 0.50–1.03)
Globulin: 2.5 g/dL (ref 1.9–3.7)
Glucose, Bld: 86 mg/dL (ref 65–99)
Potassium: 3.8 mmol/L (ref 3.5–5.3)
Sodium: 140 mmol/L (ref 135–146)
Total Bilirubin: 1.1 mg/dL (ref 0.2–1.2)
Total Protein: 6.9 g/dL (ref 6.1–8.1)
eGFR: 88 mL/min/1.73m2 (ref >=60)

## 2023-12-29 LAB — CBC WITH DIFFERENTIAL/PLATELET
Absolute Lymphocytes: 3338 {cells}/uL (ref 850–3900)
Absolute Monocytes: 818 {cells}/uL (ref 200–950)
Basophils Absolute: 78 {cells}/uL (ref 0–200)
Basophils Relative: 0.7 %
Eosinophils Absolute: 190 {cells}/uL (ref 15–500)
Eosinophils Relative: 1.7 %
HCT: 41.9 % (ref 35.9–46.0)
Hemoglobin: 14 g/dL (ref 11.7–15.5)
MCH: 27.6 pg (ref 27.0–33.0)
MCHC: 33.4 g/dL (ref 31.6–35.4)
MCV: 82.5 fL (ref 81.4–101.7)
MPV: 10.8 fL (ref 7.5–12.5)
Monocytes Relative: 7.3 %
Neutro Abs: 6776 {cells}/uL (ref 1500–7800)
Neutrophils Relative %: 60.5 %
Platelets: 340 Thousand/uL (ref 140–400)
RBC: 5.08 Million/uL (ref 3.80–5.10)
RDW: 14 % (ref 11.0–15.0)
Total Lymphocyte: 29.8 %
WBC: 11.2 Thousand/uL — ABNORMAL HIGH (ref 3.8–10.8)

## 2023-12-31 ENCOUNTER — Ambulatory Visit: Payer: Self-pay | Admitting: Family

## 2023-12-31 DIAGNOSIS — R7989 Other specified abnormal findings of blood chemistry: Secondary | ICD-10-CM

## 2023-12-31 DIAGNOSIS — D72829 Elevated white blood cell count, unspecified: Secondary | ICD-10-CM

## 2023-12-31 DIAGNOSIS — E785 Hyperlipidemia, unspecified: Secondary | ICD-10-CM

## 2023-12-31 NOTE — Telephone Encounter (Signed)
 Please advise pt that her liver function testing is mildly elevated.  I am not convinced that this is related to her being on statin medication, but just to be sure, I would like her to stop atorvastatin  and repeat LFT in 1 month.   Sugar is up a touch more at 7.2 from 7.1.  Continue to work on healthy diet and regular exercise.  Hopefully she will be able to start the Ozempic  as well which will be helpful.   Her white blood cell count is mildly elevated which is not new for her, but I think it would be a good idea for her to see the hematology specialist.

## 2024-01-05 ENCOUNTER — Encounter: Payer: Self-pay | Admitting: Neurology

## 2024-01-05 ENCOUNTER — Ambulatory Visit: Payer: Self-pay | Admitting: Neurology

## 2024-01-05 VITALS — BP 133/83 | HR 90 | Ht 65.0 in | Wt 191.5 lb

## 2024-01-05 DIAGNOSIS — G510 Bell's palsy: Secondary | ICD-10-CM

## 2024-01-05 DIAGNOSIS — Z9189 Other specified personal risk factors, not elsewhere classified: Secondary | ICD-10-CM | POA: Diagnosis not present

## 2024-01-05 NOTE — Patient Instructions (Addendum)
 No additional workup or treatment needed at this time  Continue current medications Continue follow-up PCP and return as needed

## 2024-01-05 NOTE — Progress Notes (Signed)
 [facility]  PATIENT: Diane Sexton DOB: [birth year]  REQUESTING CLINICIAN: Daryl Setter, NP HISTORY FROM: Patient REASON FOR VISIT: Right facial asymmetry   HISTORICAL  CHIEF COMPLAINT:  Chief Complaint  Patient presents with   [phone]    Pt is here Alone. Pt states that her left eye looks different from her right eye.     HISTORY OF PRESENT ILLNESS:  Discussed the use of AI scribe software for clinical note transcription with the patient, who gave verbal consent to proceed.  History of Present Illness   Diane Sexton is a 53 year old female with history of hypertension, obesity, increased anxiety and stress who presents with facial asymmetry and concerns about Bell's palsy.  Approximately three weeks ago, she developed facial asymmetry with a drooping left eyelid and a crooked smile, initially noticed in photographs and later confirmed by her primary care physician. She describes the left side of her face as feeling 'a little weird.'  She denies issues with oral function such as brushing her teeth or water spilling from her mouth. She experiences twitching on the left side of her lips. No numbness, tingling, or weakness on the left side of her body. Her eyes appear different, with one droopy. Again these symptoms started 3 weeks ago, she did not initially seek medical help, and per patient, they are improving.  She is experiencing significant stress due to her living situation with a roommate, which she finds physically and mentally unhealthy. She is in the process of applying for a new apartment and has support from her sister.     OTHER MEDICAL CONDITIONS: Hypertension, obesity, increased risk   REVIEW OF SYSTEMS: Full 14 system review of systems performed and negative with exception of: As noted in the HPI  ALLERGIES: Allergies  Allergen Reactions   Oxycodone  Other (See Comments)    Upset stomach   Cyclobenzaprine  Nausea Only    Hydrocodone -Apap-Dietary Prod Nausea And Vomiting   Ibuprofen  Other (See Comments)    Pt states she can't tolerate    Tylenol  [Acetaminophen ] Nausea Only    HOME MEDICATIONS: Outpatient Medications Prior to Visit  Medication Sig Dispense Refill   lidocaine  (HM LIDOCAINE  PATCH) 4 % Place 1 patch onto the skin daily. 30 patch 2   lisinopril  (ZESTRIL ) 20 MG tablet Take 1 tablet (20 mg total) by mouth daily. 90 tablet 1   potassium chloride  SA (KLOR-CON  M) 20 MEQ tablet Take 1 tablet (20 mEq total) by mouth daily. 90 tablet 1   famotidine  (PEPCID ) 20 MG tablet Take 1 tablet (20 mg total) by mouth 2 (two) times daily for 14 days. (Patient not taking: Reported on 01/05/2024) 28 tablet 0   Semaglutide ,0.25 or 0.5MG /DOS, (OZEMPIC , 0.25 OR 0.5 MG/DOSE,) 2 MG/1.5ML SOPN 0.25mg  sq weekly for 4 weeks, then 0.5mg  weekly thereafter (Patient not taking: Reported on 01/05/2024) 1.5 mL 2   simethicone  (MYLICON) 125 MG chewable tablet Chew 1 tablet (125 mg total) by mouth every 6 (six) hours as needed for up to 14 days for flatulence. (Patient not taking: Reported on 01/05/2024) 14 tablet 0   No facility-administered medications prior to visit.    PAST MEDICAL HISTORY: Past Medical History:  Diagnosis Date   Constipation    Depression    bi-polar   Diabetes type 2, controlled (HCC)    DUB (dysfunctional uterine bleeding)    Fibroids    History of trichomoniasis 2018   Hyperlipemia    Hypertension    PMDD (premenstrual  dysphoric disorder)     PAST SURGICAL HISTORY: Past Surgical History:  Procedure Laterality Date   APPENDECTOMY  2009   UAE  2008    FAMILY HISTORY: Family History  Problem Relation Age of Onset   Hypertension Mother    Neuropathy Mother    Hypertension Father    Diabetes Mellitus II Father    Diabetes Sister        pre-diabetic   Diabetes Mellitus II Brother    Dementia Maternal Grandmother    Heart attack Maternal Grandmother 48   Diabetes Mellitus II Paternal  Grandmother    Diabetes Mellitus II Paternal Aunt    Diabetes Mellitus II Paternal Aunt    Diabetes Mellitus II Paternal Uncle    Diabetes Mellitus II Paternal Uncle     SOCIAL HISTORY: Social History   Socioeconomic History   Marital status: Single    Spouse name: Not on file   Number of children: 0   Years of education: Not on file   Highest education level: Associate degree: academic program  Occupational History   Occupation: Agricultural Engineer: [employer]  Tobacco Use   Smoking status: Never   Smokeless tobacco: Never  Vaping Use   Vaping status: Never Used  Substance and Sexual Activity   Alcohol use: Not Currently    Alcohol/week: 1.0 standard drink of alcohol    Types: 1 Standard drinks or equivalent per week   Drug use: No   Sexual activity: Not Currently    Partners: Female, Female    Birth control/protection: None  Other Topics Concern   Not on file  Social History Narrative   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)   Lives with mother   No children   Single, never married   Completed associates, she is in school for [employer] in Criminal Justice/Human services   Enjoys walking, reading, writing   No pets   Social Drivers of Health   Financial Resource Strain: Medium Risk (08/19/2023)   Overall Financial Resource Strain (CARDIA)    Difficulty of Paying Living Expenses: Somewhat hard  Food Insecurity: Food Insecurity Present (08/19/2023)   Hunger Vital Sign    Worried About Running Out of Food in the Last Year: Sometimes true    Ran Out of Food in the Last Year: Sometimes true  Transportation Needs: No Transportation Needs (08/19/2023)   PRAPARE - Administrator, Civil Service (Medical): No    Lack of Transportation (Non-Medical): No  Physical Activity: Inactive (08/19/2023)   Exercise Vital Sign    Days of Exercise per Week: 0 days    Minutes of Exercise per Session: Not on file  Stress: Stress Concern  Present (08/19/2023)   [employer] of Occupational Health - Occupational Stress Questionnaire    Feeling of Stress: To some extent  Social Connections: Moderately Isolated (08/19/2023)   Social Connection and Isolation Panel    Frequency of Communication with Friends and Family: More than three times a week    Frequency of Social Gatherings with Friends and Family: Once a week    Attends Religious Services: More than 4 times per year    Active Member of [employer] or Organizations: No    Attends Banker Meetings: Not on file    Marital Status: Never married  Intimate Partner Violence: Not At Risk (10/08/2022)   Humiliation, Afraid, Rape, and Kick questionnaire    Fear of Current or Ex-Partner: No  Emotionally Abused: No    Physically Abused: No    Sexually Abused: No    PHYSICAL EXAM  GENERAL EXAM/CONSTITUTIONAL: Vitals:  Vitals:   01/05/24 0822  BP: 133/83  Pulse: 90  SpO2: 98%  Weight: 191 lb 8 oz (86.9 kg)  Height: 5' 5 (1.651 m)   Body mass index is 31.87 kg/m. Wt Readings from Last 3 Encounters:  01/05/24 191 lb 8 oz (86.9 kg)  12/28/23 188 lb 6.4 oz (85.5 kg)  11/18/23 181 lb (82.1 kg)   Patient is in no distress; well developed, nourished and groomed; neck is supple  MUSCULOSKELETAL: Gait, strength, tone, movements noted in Neurologic exam below  NEUROLOGIC: MENTAL STATUS:      No data to display         awake, alert, oriented to person, place and time recent and remote memory intact normal attention and concentration language fluent, comprehension intact, naming intact fund of knowledge appropriate  CRANIAL NERVE:  2nd, 3rd, 4th, 6th - Visual fields full to confrontation, extraocular muscles intact, no nystagmus 5th - facial sensation symmetric 7th -left facial asymmetry, difficulty raising her eyebrows but able to close eyes, smile is symmetric, able to puff cheeks 8th - hearing intact 9th - palate elevates symmetrically, uvula  midline 11th - shoulder shrug symmetric 12th - tongue protrusion midline  MOTOR:  normal bulk and tone, full strength in the BUE, BLE  SENSORY:  normal and symmetric to light touch  COORDINATION:  finger-nose-finger, fine finger movements normal  GAIT/STATION:  normal   DIAGNOSTIC DATA (LABS, IMAGING, TESTING) - I reviewed patient records, labs, notes, testing and imaging myself where available.  Lab Results  Component Value Date   WBC 11.2 (H) 12/28/2023   HGB 14.0 12/28/2023   HCT 41.9 12/28/2023   MCV 82.5 12/28/2023   PLT 340 12/28/2023      Component Value Date/Time   NA 140 12/28/2023 1557   K 3.8 12/28/2023 1557   CL 102 12/28/2023 1557   CO2 29 12/28/2023 1557   GLUCOSE 86 12/28/2023 1557   BUN 12 12/28/2023 1557   CREATININE 0.79 12/28/2023 1557   CALCIUM  9.5 12/28/2023 1557   PROT 6.9 12/28/2023 1557   ALBUMIN 4.5 11/18/2023 0904   AST 80 (H) 12/28/2023 1557   ALT 137 (H) 12/28/2023 1557   ALKPHOS 78 11/18/2023 0904   BILITOT 1.1 12/28/2023 1557   GFRNONAA >60 11/18/2023 0904   GFRAA >60 05/06/2018 1940   Lab Results  Component Value Date   CHOL 164 12/28/2023   HDL 50 12/28/2023   LDLCALC 93 12/28/2023   LDLDIRECT 174 (H) 12/24/2009   TRIG 109 12/28/2023   CHOLHDL 3.3 12/28/2023   Lab Results  Component Value Date   HGBA1C 7.2 (H) 12/28/2023   Lab Results  Component Value Date   VITAMINB12 273 10/13/2023   Lab Results  Component Value Date   TSH 3.69 01/25/2023     ASSESSMENT AND PLAN  53 y.o. year old female with    Bell's palsy Left-sided facial droop and asymmetry, secondary to Bell's palsy.  Symptoms began three weeks ago and are improving. No treatment initiated as it is beyond the initial three-day window for steroid therapy. No associated ear pain, hyperacusis, or significant functional impairment. No imaging required unless symptoms worsen or recur.  Most patient will obtain full recovery. - Perform facial exercises to  strengthen facial muscles, including closing eyes, raising eyebrows, smiling and puffing cheeks. - Monitor for worsening symptoms, such  as increased drooping or functional impairment, and seek medical attention if these occur.  Psychosocial stress Significant stress due to a difficult living situation with a roommate, contributing to overall well-being. Stress is acknowledged as a potential factor in the development of Bell's palsy. She has support from family and is actively seeking a new living arrangement to alleviate stress. - Continue to seek support from family and work on finding a new living arrangement to reduce stress.     1. Bell's palsy   2. At increased risk for stress     Patient Instructions  No additional workup or treatment needed at this time  Continue current medications Continue follow-up PCP and return as needed  No orders of the defined types were placed in this encounter.   No orders of the defined types were placed in this encounter.   Return if symptoms worsen or fail to improve.    Pastor Falling, MD 01/05/2024, 8:55 AM  [facility] [address], [address] [place], [place] [address] [phone]

## 2024-01-06 ENCOUNTER — Ambulatory Visit

## 2024-01-09 ENCOUNTER — Telehealth: Payer: Self-pay

## 2024-01-09 NOTE — Progress Notes (Signed)
 Complex Care Management Note  Care Guide Note 01/09/2024 Name: Diane Sexton MRN: [id number] DOB: 1970/09/27  Diane Sexton is a 52 y.o. year old female who sees Daryl Setter, NP for primary care. I reached out to Joene L Osmun by phone today to offer complex care management services.  [name] was given information about Complex Care Management services today including:   The Complex Care Management services include support from the care team which includes your Nurse Care Manager, Clinical Social Worker, or Pharmacist.  The Complex Care Management team is here to help remove barriers to the health concerns and goals most important to you. Complex Care Management services are voluntary, and the patient may decline or stop services at any time by request to their care team member.   Complex Care Management Consent Status: Patient agreed to services and verbal consent obtained.   Follow up plan:  Telephone appointment with complex care management team member scheduled for:  01/12/24 at 2:30 p.m.   Encounter Outcome:  Patient Scheduled  Dreama [facility]  [facility], [facility] VBCI Assistant Direct Dial: [phone]  Fax: [phone]

## 2024-01-12 ENCOUNTER — Other Ambulatory Visit: Payer: Self-pay | Admitting: Licensed Clinical Social Worker
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
## 2024-01-12 NOTE — Patient Instructions (Signed)
 Visit Information  Thank you for taking time to visit with me today. Please don't hesitate to contact me if I can be of assistance to you in the future.   Please call the care guide team at [phone] if you need to cancel, schedule, or reschedule an appointment.    Call 911 if you are experiencing a Mental Health or Behavioral Health Crisis or need someone to talk to.  Cena Ligas, LCSW Clinical Social Worker [facility] Population Health

## 2024-01-13 ENCOUNTER — Ambulatory Visit

## 2024-01-13 ENCOUNTER — Inpatient Hospital Stay: Attending: Hematology & Oncology

## 2024-01-13 ENCOUNTER — Inpatient Hospital Stay: Admitting: Family

## 2024-01-13 ENCOUNTER — Other Ambulatory Visit: Payer: Self-pay | Admitting: Family

## 2024-01-13 ENCOUNTER — Encounter: Payer: Self-pay | Admitting: Family

## 2024-01-13 VITALS — BP 146/88 | HR 80 | Temp 98.3°F | Resp 18 | Ht 65.5 in | Wt 189.4 lb

## 2024-01-13 DIAGNOSIS — D72829 Elevated white blood cell count, unspecified: Secondary | ICD-10-CM | POA: Diagnosis not present

## 2024-01-13 LAB — CBC WITH DIFFERENTIAL (CANCER CENTER ONLY)
Abs Immature Granulocytes: 0.05 K/uL (ref 0.00–0.07)
Basophils Absolute: 0.1 K/uL (ref 0.0–0.1)
Basophils Relative: 1 %
Eosinophils Absolute: 0.2 K/uL (ref 0.0–0.5)
Eosinophils Relative: 3 %
HCT: 40.5 % (ref 36.0–46.0)
Hemoglobin: 13.7 g/dL (ref 12.0–15.0)
Immature Granulocytes: 1 %
Lymphocytes Relative: 30 %
Lymphs Abs: 2.9 K/uL (ref 0.7–4.0)
MCH: 27.1 pg (ref 26.0–34.0)
MCHC: 33.8 g/dL (ref 30.0–36.0)
MCV: 80.2 fL (ref 80.0–100.0)
Monocytes Absolute: 0.6 K/uL (ref 0.1–1.0)
Monocytes Relative: 6 %
Neutro Abs: 5.8 K/uL (ref 1.7–7.7)
Neutrophils Relative %: 59 %
Platelet Count: 292 K/uL (ref 150–400)
RBC: 5.05 MIL/uL (ref 3.87–5.11)
RDW: 14.5 % (ref 11.5–15.5)
WBC Count: 9.6 K/uL (ref 4.0–10.5)
nRBC: 0 % (ref 0.0–0.2)

## 2024-01-13 LAB — CMP (CANCER CENTER ONLY)
ALT: 87 U/L — ABNORMAL HIGH (ref 0–44)
AST: 43 U/L — ABNORMAL HIGH (ref 15–41)
Albumin: 4.3 g/dL (ref 3.5–5.0)
Alkaline Phosphatase: 89 U/L (ref 38–126)
Anion gap: 11 (ref 5–15)
BUN: 14 mg/dL (ref 6–20)
CO2: 27 mmol/L (ref 22–32)
Calcium: 9.1 mg/dL (ref 8.9–10.3)
Chloride: 103 mmol/L (ref 98–111)
Creatinine: 0.79 mg/dL (ref 0.44–1.00)
GFR, Estimated: >60 mL/min (ref >60)
Glucose, Bld: 201 mg/dL — ABNORMAL HIGH (ref 70–99)
Potassium: 3.9 mmol/L (ref 3.5–5.1)
Sodium: 142 mmol/L (ref 135–145)
Total Bilirubin: 1 mg/dL (ref 0.0–1.2)
Total Protein: 6.8 g/dL (ref 6.5–8.1)

## 2024-01-13 LAB — LACTATE DEHYDROGENASE: LDH: 181 U/L (ref 105–235)

## 2024-01-13 LAB — SAVE SMEAR(SSMR), FOR PROVIDER SLIDE REVIEW
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
## 2024-01-16 ENCOUNTER — Encounter: Payer: Self-pay | Admitting: *Deleted

## 2024-01-16 ENCOUNTER — Telehealth: Payer: Self-pay

## 2024-01-16 NOTE — Progress Notes (Signed)
 Complex Care Management Care Guide Note  01/16/2024 Name: Diane Sexton MRN: [id number] DOB: Nov 13, 1970  Diane Sexton is a 53 y.o. year old female who is a primary care patient of Daryl Setter, NP. I reached out to Manilla L Chavero by phone today to assist with scheduling  with the Licensed Clinical Child Psychotherapist.  Follow up plan: Unsuccessful telephone outreach attempt made. A HIPAA compliant phone message was left for the patient providing contact information and requesting a return call.  Dreama [facility]  [facility], [facility] VBCI Assistant Direct Dial: [phone]  Fax: [phone]

## 2024-01-16 NOTE — Progress Notes (Signed)
 Complex Care Management Note  Care Guide Note 01/16/2024 Name: Diane Sexton MRN: [id number] DOB: [birth year]  Diane Sexton is a 53 y.o. year old female who sees Daryl Setter, NP for primary care. I reached out to Diane Sexton by phone today to offer complex care management services.  Diane Sexton was given information about Complex Care Management services today including:   The Complex Care Management services include support from the care team which includes your Nurse Care Manager, Clinical Social Worker, or Pharmacist.  The Complex Care Management team is here to help remove barriers to the health concerns and goals most important to you. Complex Care Management services are voluntary, and the patient may decline or stop services at any time by request to their care team member.   Complex Care Management Consent Status: Patient agreed to services and verbal consent obtained.   Follow up plan:  Telephone appointment with complex care management team member scheduled for:  02/08/24 at 2:00 p.m.   Encounter Outcome:  Patient Scheduled  [facility]iane [facility]  [facility], [facility] VBCI Assistant Direct Dial: [phone]  Fax: [phone]
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
## 2024-02-03 ENCOUNTER — Other Ambulatory Visit: Payer: Self-pay

## 2024-02-03 ENCOUNTER — Telehealth: Payer: Self-pay

## 2024-02-03 ENCOUNTER — Other Ambulatory Visit

## 2024-02-03 ENCOUNTER — Encounter: Payer: Self-pay | Admitting: Gastroenterology

## 2024-02-03 DIAGNOSIS — E785 Hyperlipidemia, unspecified: Secondary | ICD-10-CM | POA: Diagnosis not present

## 2024-02-03 DIAGNOSIS — R7989 Other specified abnormal findings of blood chemistry: Secondary | ICD-10-CM | POA: Diagnosis not present

## 2024-02-03 DIAGNOSIS — R400 Somnolence: Secondary | ICD-10-CM

## 2024-02-03 DIAGNOSIS — R0683 Snoring: Secondary | ICD-10-CM

## 2024-02-03 LAB — HEPATIC FUNCTION PANEL
ALT: 25 U/L (ref 3–35)
AST: 16 U/L (ref 5–37)
Albumin: 4 g/dL (ref 3.5–5.2)
Alkaline Phosphatase: 58 U/L (ref 39–117)
Bilirubin, Direct: 0.2 mg/dL (ref 0.1–0.3)
Total Bilirubin: 1.1 mg/dL (ref 0.2–1.2)
Total Protein: 6.4 g/dL (ref 6.0–8.3)

## 2024-02-03 LAB — LIPID PANEL
Cholesterol: 231 mg/dL — ABNORMAL HIGH (ref 28–200)
HDL: 42.1 mg/dL
LDL Cholesterol: 154 mg/dL — ABNORMAL HIGH (ref 10–99)
NonHDL: 188.4
Total CHOL/HDL Ratio: 5
Triglycerides: 170 mg/dL — ABNORMAL HIGH (ref 10.0–149.0)
VLDL: 34 mg/dL (ref 0.0–40.0)

## 2024-02-03 NOTE — Telephone Encounter (Signed)
 Patient came in to the office to report per oncology she needs referral for sleep apnea.  As per ov note with Camie Pepper NP 01/13/24  I did recommend she follow with PCP regarding a sleep study for possible sleep apnea Patient reports feeling tired during the day and snoring.

## 2024-02-06 ENCOUNTER — Other Ambulatory Visit (HOSPITAL_BASED_OUTPATIENT_CLINIC_OR_DEPARTMENT_OTHER): Payer: Self-pay | Admitting: Family

## 2024-02-06 ENCOUNTER — Ambulatory Visit (INDEPENDENT_AMBULATORY_CARE_PROVIDER_SITE_OTHER)

## 2024-02-06 ENCOUNTER — Encounter: Payer: Self-pay | Admitting: Adult Health

## 2024-02-06 ENCOUNTER — Ambulatory Visit: Payer: Self-pay

## 2024-02-06 ENCOUNTER — Other Ambulatory Visit: Payer: Self-pay | Admitting: Family

## 2024-02-06 ENCOUNTER — Ambulatory Visit (INDEPENDENT_AMBULATORY_CARE_PROVIDER_SITE_OTHER): Admitting: Adult Health

## 2024-02-06 VITALS — BP 134/86 | HR 84 | Temp 98.1°F | Ht 65.5 in | Wt 189.0 lb

## 2024-02-06 VITALS — BP 136/82 | Ht 65.5 in | Wt 189.0 lb

## 2024-02-06 DIAGNOSIS — M24851 Other specific joint derangements of right hip, not elsewhere classified: Secondary | ICD-10-CM | POA: Diagnosis not present

## 2024-02-06 DIAGNOSIS — R400 Somnolence: Secondary | ICD-10-CM | POA: Diagnosis not present

## 2024-02-06 DIAGNOSIS — R5383 Other fatigue: Secondary | ICD-10-CM

## 2024-02-06 DIAGNOSIS — R0683 Snoring: Secondary | ICD-10-CM | POA: Diagnosis not present

## 2024-02-06 DIAGNOSIS — M533 Sacrococcygeal disorders, not elsewhere classified: Secondary | ICD-10-CM | POA: Diagnosis not present

## 2024-02-06 DIAGNOSIS — G8929 Other chronic pain: Secondary | ICD-10-CM

## 2024-02-06 DIAGNOSIS — Z1231 Encounter for screening mammogram for malignant neoplasm of breast: Secondary | ICD-10-CM

## 2024-02-06 DIAGNOSIS — M25552 Pain in left hip: Secondary | ICD-10-CM | POA: Diagnosis present

## 2024-02-06 DIAGNOSIS — M25551 Pain in right hip: Secondary | ICD-10-CM | POA: Diagnosis not present

## 2024-02-06 DIAGNOSIS — E785 Hyperlipidemia, unspecified: Secondary | ICD-10-CM

## 2024-02-06 DIAGNOSIS — M24852 Other specific joint derangements of left hip, not elsewhere classified: Secondary | ICD-10-CM | POA: Diagnosis not present

## 2024-02-06 MED ORDER — METHYLPREDNISOLONE ACETATE 40 MG/ML IJ SUSP
40.0000 mg | Freq: Once | INTRAMUSCULAR | Status: AC
  Administered 2024-02-06: 40 mg via INTRA_ARTICULAR

## 2024-02-06 NOTE — Telephone Encounter (Signed)
 Called patient twice, but no answer and no voice mail set up

## 2024-02-06 NOTE — Patient Instructions (Signed)
 Set up for home sleep study  Work on healthy weight loss  Do not  drive if sleepy  Follow up in 6 weeks -[facility] and As needed
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
## 2024-02-06 NOTE — Telephone Encounter (Signed)
 Please advise pt that her liver function testing has returned to normal. Please advise pt that her cholesterol is elevated.  I would recommend that she start atorvastatin  once daily to decrease her risk of heart attack and stroke.

## 2024-02-06 NOTE — Addendum Note (Signed)
 Addended by: CHARLES ROGUE A on: 02/06/2024 01:17 PM   Modules accepted: Orders

## 2024-02-06 NOTE — Progress Notes (Signed)
 "  @Patient  ID: Diane Sexton, female    DOB: 1970-10-11, 54 y.o.   MRN: [id number]  Chief Complaint  Patient presents with   Consult    sleep    Referring provider: Daryl Setter, NP  HPI: 54 year old female seen for sleep consult February 06, 2024 for loud snoring and daytime sleepiness Medical history significant for hypertension    TEST/EVENTS : Reviewed 02/06/2024  Discussed the use of AI scribe software for clinical note transcription with the patient, who gave verbal consent to proceed.  History of Present Illness Diane Sexton is a 54 year old female with hypertension and type 2 diabetes who presents for a sleep consult due to long-standing loud snoring and daytime sleepiness.  She experiences loud snoring that often wakes her up, along with mouth breathing and teeth grinding during sleep. No use of sleep aids. Consumes one cup of coffee daily. No history of sleep talking, sleep walking, cataplexy, or narcolepsy-like symptoms.  Has chronic headaches.  Has daytime fatigue, low energy poor concentration.  Her sleep routine involves going to bed between 9 and 11 PM, taking up to an hour to fall asleep, and waking up once during the night to urinate. She experiences daytime sleepiness, often dozing off while watching TV. Her weight is 189 pounds with BMI at 30.  Her past medical history includes hypertension, type 2 diabetes, high cholesterol, allergies, and frequent headaches. She is currently taking lisinopril  for hypertension and  managing her diabetes with diet control.  Family history is significant for a brother with sleep apnea.   She does not smoke and works a day shift.  Patient is single.  Has no children.  Works in marine scientist.     Allergies[1]  Immunization History  Administered Date(s) Administered   PFIZER(Purple Top)SARS-COV-2 Vaccination 07/19/2019, 08/07/2019   Pfizer Covid-19 Vaccine Bivalent Booster 39yrs & up 04/02/2020    Past Medical  History:  Diagnosis Date   Constipation    Depression    bi-polar   Diabetes type 2, controlled (HCC)    DUB (dysfunctional uterine bleeding)    Fibroids    History of trichomoniasis 2018   Hyperlipemia    Hypertension    PMDD (premenstrual dysphoric disorder)     Tobacco History: Tobacco Use History[2] Counseling given: Not Answered   Outpatient Medications Prior to Visit  Medication Sig Dispense Refill   lidocaine  (HM LIDOCAINE  PATCH) 4 % Place 1 patch onto the skin daily. 30 patch 2   lisinopril  (ZESTRIL ) 20 MG tablet Take 1 tablet (20 mg total) by mouth daily. 90 tablet 1   potassium chloride  SA (KLOR-CON  M) 20 MEQ tablet Take 1 tablet (20 mEq total) by mouth daily. (Patient not taking: Reported on 02/06/2024) 90 tablet 1   Semaglutide ,0.25 or 0.5MG /DOS, (OZEMPIC , 0.25 OR 0.5 MG/DOSE,) 2 MG/1.5ML SOPN 0.25mg  sq weekly for 4 weeks, then 0.5mg  weekly thereafter (Patient not taking: Reported on 02/06/2024) 1.5 mL 2   No facility-administered medications prior to visit.     Review of Systems:   Constitutional:   No  weight loss, night sweats,  Fevers, chills,+ fatigue, or  lassitude.  HEENT:   No headaches,  Difficulty swallowing,  Tooth/dental problems, or  Sore throat,                No sneezing, itching, ear ache, nasal congestion, post nasal drip,   CV:  No chest pain,  Orthopnea, PND, swelling in lower extremities, anasarca, dizziness, palpitations, syncope.   GI  No heartburn, indigestion, abdominal pain, nausea, vomiting, diarrhea, change in bowel habits, loss of appetite, bloody stools.   Resp: No shortness of breath with exertion or at rest.  No excess mucus, no productive cough,  No non-productive cough,  No coughing up of blood.  No change in color of mucus.  No wheezing.  No chest wall deformity  Skin: no rash or lesions.  GU: no dysuria, change in color of urine, no urgency or frequency.  No flank pain, no hematuria   MS:  No joint pain or swelling.  No  decreased range of motion.  No back pain.    Physical Exam  BP 134/86   Pulse 84   Temp 98.1 F (36.7 C)   Ht 5' 5.5 (1.664 m) Comment: Per pt  Wt 189 lb (85.7 kg)   LMP 11/07/2019   SpO2 95% Comment: RA  BMI 30.97 kg/m   GEN: A/Ox3; pleasant , NAD, well nourished    HEENT:  [place]/AT,  EACs-clear, TMs-wnl, NOSE-clear, THROAT-clear, no lesions, no postnasal drip or exudate noted.   NECK:  Supple w/ fair ROM; no JVD; normal carotid impulses w/o bruits; no thyromegaly or nodules palpated; no lymphadenopathy.    RESP  Clear  P & A; w/o, wheezes/ rales/ or rhonchi. no accessory muscle use, no dullness to percussion  CARD:  RRR, no m/r/g, no peripheral edema, pulses intact, no cyanosis or clubbing.  GI:   Soft & nt; nml bowel sounds; no organomegaly or masses detected.   Musco: Warm bil, no deformities or joint swelling noted.   Neuro: alert, no focal deficits noted.    Skin: Warm, no lesions or rashes    Lab Results:Reviewed 02/06/2024   CBC    Component Value Date/Time   WBC 9.6 01/13/2024 0902   WBC 11.2 (H) 12/28/2023 1557   RBC 5.05 01/13/2024 0902   HGB 13.7 01/13/2024 0902   HCT 40.5 01/13/2024 0902   PLT 292 01/13/2024 0902   MCV 80.2 01/13/2024 0902   MCH 27.1 01/13/2024 0902   MCHC 33.8 01/13/2024 0902   RDW 14.5 01/13/2024 0902   LYMPHSABS 2.9 01/13/2024 0902   MONOABS 0.6 01/13/2024 0902   EOSABS 0.2 01/13/2024 0902   BASOSABS 0.1 01/13/2024 0902    BMET    Component Value Date/Time   NA 142 01/13/2024 0902   K 3.9 01/13/2024 0902   CL 103 01/13/2024 0902   CO2 27 01/13/2024 0902   GLUCOSE 201 (H) 01/13/2024 0902   BUN 14 01/13/2024 0902   CREATININE 0.79 01/13/2024 0902   CREATININE 0.79 12/28/2023 1557   CALCIUM  9.1 01/13/2024 0902   GFRNONAA >60 01/13/2024 0902   GFRAA >60 05/06/2018 1940    BNP No results found for: BNP  ProBNP No results found for: PROBNP  Imaging: No results found.       No data to display           No results found for: NITRICOXIDE     02/06/2024   12:00 PM  Results of the [place] flowsheet  Sitting and reading 3  Watching TV 3  Sitting, inactive in a public place (e.g. a theatre or a meeting) 2  As a passenger in a car for an hour without a break 0  Lying down to rest in the afternoon when circumstances permit 3  Sitting and talking to someone 2  Sitting quietly after a lunch without alcohol 3  In a car, while stopped for a few minutes in  traffic 0  Total score 16        Assessment & Plan:   Assessment and Plan Assessment & Plan Suspected obstructive sleep apnea  -ongoing daily symptoms of  Chronic loud snoring, mouth breathing, teeth grinding, and daytime fatigue suggest obstructive sleep apnea. She has a family history of sleep apnea in her brother and has not had a prior sleep study. Symptoms include headaches, , and daytime sleepiness. Potential complications of untreated sleep apnea, such as cardiovascular stress, hypertension, arrhythmias, and stroke, were discussed. A home sleep study was ordered A follow-up appointment is scheduled in six weeks, either virtual or in-person, to discuss the sleep study results. Information on sleep apnea and the home sleep study process was provided.  - discussed how weight can impact sleep and risk for sleep disordered breathing - discussed options to assist with weight loss: combination of diet modification, cardiovascular and strength training exercises   - had an extensive discussion regarding the adverse health consequences related to untreated sleep disordered breathing - specifically discussed the risks for hypertension, coronary artery disease, cardiac dysrhythmias, cerebrovascular disease, and diabetes - lifestyle modification discussed   - discussed how sleep disruption can increase risk of accidents, particularly when driving - safe driving practices were discussed    BMI 30 -ongoing struggles with weight loss   Discussed healthy weight loss.           Diane Switala, NP 02/06/2024     [1]  Allergies Allergen Reactions   Cyclobenzaprine  Nausea Only   Hydrocodone -Apap-Dietary Prod Nausea And Vomiting   Ibuprofen  Nausea And Vomiting        Oxycodone  Other (See Comments)    Upset stomach   Tylenol  [Acetaminophen ] Nausea Only  [2]  Social History Tobacco Use  Smoking Status Never  Smokeless Tobacco Never   "

## 2024-02-08 ENCOUNTER — Other Ambulatory Visit: Payer: Self-pay | Admitting: *Deleted
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
## 2024-02-12 ENCOUNTER — Other Ambulatory Visit (HOSPITAL_BASED_OUTPATIENT_CLINIC_OR_DEPARTMENT_OTHER): Payer: Self-pay

## 2024-02-12 MED ORDER — ATORVASTATIN CALCIUM 20 MG PO TABS
20.0000 mg | ORAL_TABLET | Freq: Every day | ORAL | 1 refills | Status: AC
  Filled 2024-02-12: qty 90, 90d supply, fill #0

## 2024-02-12 NOTE — Telephone Encounter (Signed)
Please mail her a letter.

## 2024-02-13 ENCOUNTER — Other Ambulatory Visit (HOSPITAL_BASED_OUTPATIENT_CLINIC_OR_DEPARTMENT_OTHER): Payer: Self-pay

## 2024-02-16 ENCOUNTER — Ambulatory Visit

## 2024-02-16 NOTE — Telephone Encounter (Signed)
 Mailed out to pt

## 2024-02-16 NOTE — Telephone Encounter (Signed)
 See letter.

## 2024-02-21 ENCOUNTER — Telehealth: Payer: Self-pay | Admitting: *Deleted

## 2024-02-22 ENCOUNTER — Encounter: Payer: Self-pay | Admitting: Obstetrics and Gynecology

## 2024-02-22 ENCOUNTER — Ambulatory Visit: Admitting: Obstetrics and Gynecology

## 2024-02-22 ENCOUNTER — Other Ambulatory Visit (HOSPITAL_COMMUNITY)
Admission: RE | Admit: 2024-02-22 | Discharge: 2024-02-22 | Disposition: A | Source: Ambulatory Visit | Attending: Obstetrics and Gynecology | Admitting: Obstetrics and Gynecology

## 2024-02-22 ENCOUNTER — Telehealth: Payer: Self-pay | Admitting: *Deleted

## 2024-02-22 VITALS — BP 140/82 | HR 79 | Wt 186.0 lb

## 2024-02-22 DIAGNOSIS — Z124 Encounter for screening for malignant neoplasm of cervix: Secondary | ICD-10-CM | POA: Insufficient documentation

## 2024-02-22 DIAGNOSIS — N760 Acute vaginitis: Secondary | ICD-10-CM
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
## 2024-02-22 NOTE — Telephone Encounter (Signed)
 Pt has been scheduled and made aware of all appt info. NFN

## 2024-02-22 NOTE — Telephone Encounter (Signed)
 HST order was placed on 02/06/24. Patient has [facility] mutual which is not in network with [employer]. She will probably have to pick up one of our HST machines

## 2024-02-22 NOTE — Telephone Encounter (Signed)
 Copied from CRM #[id number]. Topic: Clinical - Request for Lab/Test Order >> Feb 20, 2024  9:14 AM Ismael A wrote: Reason for CRM: patient calling regarding home sleep test, she has not heard back regarding it and would like to know when to expect a follow up on this or when she can expect to receive her kit. She states she has an upcoming appt with her pcp where she needs to give them an update on how sleep test went -  I advised the order was placed and is pending review - please follow up w/ pt at ph# [phone]

## 2024-02-23 ENCOUNTER — Other Ambulatory Visit: Payer: Self-pay | Admitting: *Deleted

## 2024-02-23 LAB — CERVICOVAGINAL ANCILLARY ONLY
Bacterial Vaginitis (gardnerella): NEGATIVE
Candida Glabrata: NEGATIVE
Candida Vaginitis: NEGATIVE
Chlamydia: NEGATIVE
Comment: NEGATIVE
Comment: NEGATIVE
Comment: NEGATIVE
Comment: NEGATIVE
Comment: NEGATIVE
Comment: NORMAL
Neisseria Gonorrhea: NEGATIVE
Trichomonas: NEGATIVE

## 2024-02-24 ENCOUNTER — Ambulatory Visit: Payer: Self-pay | Admitting: Obstetrics and Gynecology

## 2024-02-24 NOTE — Patient Instructions (Signed)
 Visit Information  Ms. Alfred was given information about Medicaid Managed Care team care coordination services as a part of their [facility] Medicaid benefit.   If you would like to schedule transportation through your [facility], please call the following number at least 2 days in advance of your appointment: [phone]   Rides for urgent appointments can also be made after hours by calling Member Services.  Call the Behavioral Health Crisis Line at [phone], at any time, 24 hours a day, 7 days a week. If you are in danger or need immediate medical attention call 911.  Care plan and visit instructions communicated with the patient verbally today. Patient agrees to receive a copy in MyChart. Active MyChart status and patient understanding of how to access instructions and care plan via MyChart confirmed with patient.     Licensed Clinical Social Worker will follow up with patient on 03/15/24 11am  Natan Hartog, LCSW [place]  [facility], [facility] Licensed Clinical Social Worker  Direct Dial: [phone]     Following is a copy of your plan of care:   Goals Addressed             This Visit's Progress    VBCI Social Work Care Plan       Problems:   Depression    CSW Clinical Goal(s):   Over the next 90 days the Patient  will follow up with up with a therapist fr ongoing mental health support as evidenced by patient report.             Over the next 30 days patient will follow up with a woman's support group as evidenced by patient report   Interventions:  Mental Health:  Evaluation of current treatment plan related to family stressors Active listening / Reflection utilized Mining Engineer reviewed Caregiver stress acknowledged :  Emotional Support Provided Motivational Interviewing employed Participation in counseling encourage : CSW will provide resources for  review Participation in support group encouraged : CSW will provide resources for review  Patient Goals/Self-Care Activities:  Continue taking your medication as prescribed.   Patient to review resources for ongoing mental health support   Plan:   Telephone follow up appointment with care management team member scheduled for:  03/15/24 11am

## 2024-02-24 NOTE — Patient Outreach (Signed)
 Complex Care Management   Visit Note  02/24/2024  Name:  Diane Sexton MRN: [id number] DOB: 1970/03/07  Situation: Referral received for Complex Care Management related to Mental/Behavioral Health diagnosis depression I obtained verbal consent from Patient.  Visit completed with Patient  on the phone  Background:   Past Medical History:  Diagnosis Date   Constipation    Depression    bi-polar   Diabetes type 2, controlled (HCC)    DUB (dysfunctional uterine bleeding)    Fibroids    History of trichomoniasis 2018   Hyperlipemia    Hypertension    PMDD (premenstrual dysphoric disorder)     Assessment: family and work stressors discussed-patient able to verbalize positive coping strategies to manage stress/conflicts. Patient open to ongoing mental health support Patient Reported Symptoms:  Cognitive Cognitive Status: Alert and oriented to person, place, and time, Normal speech and language skills Cognitive/Intellectual Conditions Management [RPT]: None reported or documented in medical history or problem list      Neurological Neurological Review of Symptoms: No symptoms reported    HEENT HEENT Symptoms Reported: No symptoms reported      Cardiovascular Cardiovascular Symptoms Reported:  (tingling sensation upper left part of chest-behing left chest-saw doctor scan nothing but did not find anything)    Respiratory Respiratory Symptoms Reported: No symptoms reported    Endocrine Endocrine Symptoms Reported: No symptoms reported Is patient diabetic?: Yes Is patient checking blood sugars at home?: No Endocrine Comment: manages with diet and excercise  Gastrointestinal Gastrointestinal Symptoms Reported: No symptoms reported      Genitourinary Genitourinary Symptoms Reported: No symptoms reported    Integumentary Integumentary Symptoms Reported: No symptoms reported    Musculoskeletal Musculoskelatal Symptoms Reviewed: No symptoms reported        Psychosocial  Psychosocial Symptoms Reported: Depression - if selected complete PHQ 2-9 Additional Psychological Details: discussed recent life changes/stressors-family and job stress Behavioral Management Strategies: Coping strategies, Counseling Major Change/Loss/Stressor/Fears (CP): Relationship concerns, School or job Behaviors When Feeling Stressed/Fearful: spritual practices, mindfulness, optimism Techniques to [employer] with Loss/Stress/Change: Diversional activities, Spiritual practice(s) Quality of Family Relationships: supportive    02/24/2024    PHQ2-9 Depression Screening   Little interest or pleasure in doing things Not at all  Feeling down, depressed, or hopeless Not at all  PHQ-2 - Total Score 0  Trouble falling or staying asleep, or sleeping too much    Feeling tired or having little energy    Poor appetite or overeating     Feeling bad about yourself - or that you are a failure or have let yourself or your family down    Trouble concentrating on things, such as reading the newspaper or watching television    Moving or speaking so slowly that other people could have noticed.  Or the opposite - being so fidgety or restless that you have been moving around a lot more than usual    Thoughts that you would be better off dead, or hurting yourself in some way    PHQ2-9 Total Score    If you checked off any problems, how difficult have these problems made it for you to do your work, take care of things at home, or get along with other people    Depression Interventions/Treatment      There were no vitals filed for this visit.    Medications Reviewed Today     Reviewed by Ermalinda Lenn HERO, LCSW (Social Worker) on 02/23/24 at 1616  Med List Status: <None>  Medication Order Taking? Sig Documenting Provider Last Dose Status Informant  atorvastatin  (LIPITOR) 20 MG tablet [id number]  Take 1 tablet (20 mg total) by mouth daily.  Patient not taking: Reported on 02/23/2024   Daryl Setter, NP   Active   lidocaine  (HM LIDOCAINE  PATCH) 4 % [id number] Yes Place 1 patch onto the skin daily. O'Sullivan, Melissa, NP  Active   lisinopril  (ZESTRIL ) 20 MG tablet [id number] Yes Take 1 tablet (20 mg total) by mouth daily. Daryl Setter, NP  Active   potassium chloride  SA (KLOR-CON  M) 20 MEQ tablet [id number]  Take 1 tablet (20 mEq total) by mouth daily.  Patient not taking: Reported on 02/23/2024   Daryl Setter, NP  Active   Semaglutide ,0.25 or 0.5MG /DOS, (OZEMPIC , 0.25 OR 0.5 MG/DOSE,) 2 MG/1.5ML SOPN [id number]  0.25mg  sq weekly for 4 weeks, then 0.5mg  weekly thereafter  Patient not taking: Reported on 02/23/2024   Daryl Setter, NP  Active             Recommendation:   PCP Follow-up Specialty provider follow-up as scheduled Ongoing mental health counseling  Follow Up Plan:   Telephone follow up appointment date/time:  03/15/24 11am  Shain Pauwels, LCSW [place]  [facility], [facility] Licensed Clinical Social Worker  Direct Dial: [phone]

## 2024-02-27 ENCOUNTER — Ambulatory Visit: Admitting: Physical Therapy

## 2024-02-28 ENCOUNTER — Encounter: Payer: Self-pay | Admitting: Family

## 2024-02-28 ENCOUNTER — Telehealth: Payer: Self-pay | Admitting: Family

## 2024-02-28 ENCOUNTER — Telehealth: Payer: Self-pay | Admitting: *Deleted

## 2024-02-28 LAB — CYTOLOGY - PAP
Adequacy: ABSENT
Comment: NEGATIVE
Diagnosis: UNDETERMINED — AB
High risk HPV: NEGATIVE

## 2024-02-28 NOTE — Progress Notes (Unsigned)
" ° ° °  GYNECOLOGY VISIT  Patient name: Diane Sexton  Date of birth: Aug 23, 1970 Chief Complaint:   Vaginal Pain   History:  Discussed the use of AI scribe software for clinical note transcription with the patient, who gave verbal consent to proceed.  History of Present Illness      The following portions of the patient's history were reviewed and updated as appropriate: allergies, current medications, past family history, past medical history, past social history, past surgical history and problem list.   Health Maintenance:   Last pap     Component Value Date/Time   DIAGPAP  01/11/2022 1036    - Negative for intraepithelial lesion or malignancy (NILM)   DIAGPAP  12/14/2018 1052    - Negative for intraepithelial lesion or malignancy (NILM)   DIAGPAP  11/29/2017 0000    NEGATIVE FOR INTRAEPITHELIAL LESIONS OR MALIGNANCY.   HPVHIGH Negative 01/11/2022 1036   HPVHIGH Negative 12/14/2018 1052   ADEQPAP  01/11/2022 1036    Satisfactory for evaluation; transformation zone component ABSENT.   ADEQPAP  12/14/2018 1052    Satisfactory for evaluation; transformation zone component PRESENT.   ADEQPAP  11/29/2017 0000    Satisfactory for evaluation  endocervical/transformation zone component PRESENT.    Health Maintenance  Topic Date Due   Eye exam for diabetics  Never done   COVID-19 Vaccine (4 - 2025-26 season) 10/03/2023   Flu Shot  05/01/2024*   DTaP/Tdap/Td vaccine (1 - Tdap) 10/12/2024*   Hepatitis B Vaccine (1 of 3 - 19+ 3-dose series) 12/27/2024*   Kidney health urinalysis for diabetes  04/11/2024   Hemoglobin A1C  06/26/2024   Complete foot exam   10/12/2024   Yearly kidney function blood test for diabetes  01/12/2025   Breast Cancer Screening  03/02/2025   Pap with HPV screening  01/12/2027   Colon Cancer Screening  08/30/2030   HPV Vaccine (No Doses Required) Completed   Hepatitis C Screening  Completed   HIV Screening  Completed    Meningitis B Vaccine  Aged Out   Pneumococcal Vaccine for age over 32  Discontinued   Zoster (Shingles) Vaccine  Discontinued  *Topic was postponed. The date shown is not the original due date.      Review of Systems:  {Ros - complete:30496} Comprehensive review of systems was otherwise negative.   Objective:  Physical Exam BP (!) 140/82   Pulse 79   Wt 186 lb (84.4 kg)   LMP 11/07/2019   BMI 30.48 kg/m    Physical Exam   Labs and Imaging US  LIMITED JOINT SPACE STRUCTURES LOW RIGHT Result Date: 02/11/2024 SI Joint Injection with Ultrasound Guidance Procedure Note Diane Sexton [birth year] Indications: Pain Procedure Details Verbal consent obtained. Risks (including potential skin lightening, and potential atrophy), benefits, and alternatives have been discussed with the patient. Patient prone. The right SI joint was identified with US . Chloraprep for prep and ethyl chloride for anesthesia.  Under sterile conditions, SI joint injected at approximately a 45 degree angle with a solution of 40mg  Depo-Medrol  and 2cc Mepivacaine 2%. No resistance met and medication flowed freely. Please see medical record for associated US  images.       Assessment & Plan:  Assessment and Plan Assessment & Plan        *** Routine preventative health maintenance measures emphasized.  Carter Quarry, MD Minimally Invasive [facility]necologic [facility], [facility] "
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
## 2024-02-29 NOTE — Telephone Encounter (Signed)
 Email sent to patient with the information requested as recorded during ov from Last year
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
## 2024-03-01 ENCOUNTER — Ambulatory Visit

## 2024-03-01 ENCOUNTER — Other Ambulatory Visit (HOSPITAL_BASED_OUTPATIENT_CLINIC_OR_DEPARTMENT_OTHER): Payer: Self-pay

## 2024-03-01 DIAGNOSIS — N958 Other specified menopausal and perimenopausal disorders: Secondary | ICD-10-CM

## 2024-03-01 DIAGNOSIS — N760 Acute vaginitis: Secondary | ICD-10-CM

## 2024-03-02 ENCOUNTER — Ambulatory Visit: Admitting: Physician Assistant

## 2024-03-02 ENCOUNTER — Other Ambulatory Visit (HOSPITAL_BASED_OUTPATIENT_CLINIC_OR_DEPARTMENT_OTHER): Payer: Self-pay

## 2024-03-02 ENCOUNTER — Ambulatory Visit: Payer: Self-pay | Admitting: Gastroenterology

## 2024-03-02 MED ORDER — REPLENS VAGINAL MOISTURIZER VA GEL
1.0000 | Freq: Every day | VAGINAL | 8 refills | Status: AC
  Filled 2024-03-02: qty 6.7, 8d supply, fill #0

## 2024-03-02 NOTE — Addendum Note (Signed)
 Addended by: Aniket Paye, SOLA O on: 03/02/2024 01:24 PM   Modules accepted: Orders

## 2024-03-05 ENCOUNTER — Ambulatory Visit: Admitting: Cardiology

## 2024-03-06 ENCOUNTER — Other Ambulatory Visit (HOSPITAL_BASED_OUTPATIENT_CLINIC_OR_DEPARTMENT_OTHER): Payer: Self-pay

## 2024-03-07 NOTE — Progress Notes (Unsigned)
 "   Cardiology Clinic Note   Patient Name: Diane Sexton Date of Encounter: 03/09/2024  Primary Care Provider:  Daryl Setter, NP Primary Cardiologist:  None  Patient Profile    Diane Sexton 54 year old female presents to the clinic today for evaluation of her chest pain.  Past Medical History    Past Medical History:  Diagnosis Date   Constipation    Depression    bi-polar   Diabetes type 2, controlled (HCC)    DUB (dysfunctional uterine bleeding)    Fibroids    History of trichomoniasis 2018   Hyperlipemia    Hypertension    PMDD (premenstrual dysphoric disorder)    Past Surgical History:  Procedure Laterality Date   APPENDECTOMY  2009   [place]  2008    Allergies  Allergies[1]  History of Present Illness    Diane Sexton has a PMH of HTN, HLD, type 2 diabetes, low back pain, generalized anxiety disorder, depression, and abnormal LFTs.  She reports a family history of heart disease with her maternal great heart attack and her mom having CHF.  She denies sibling history of heart disease and reports that her dad did not have any heart disease.  She was seen and evaluated in the emergency department at [facility].  She presented on 11/18/2023.  She reported intermittent left-sided chest pain that had been present for the last several months.  She noted that the pain would come and go.  She described it as short in duration.  She also noted a tingling and cool type sensation.  She denied recent travel or surgery.  She denied pain with activity.  She noted that the pain would come on at random times.  She noted that it could happen with laying down in bed.  She denied fever and chills.  Her EKG showed sinus rhythm.  She was not noted to have risk factors for PE.  Wells criteria was 0.  During the time of evaluation she was not having pain.  Her lab work showed slightly elevated glucose, AST of 42, ALT of 47 and total bilirubin slightly elevated at  1.5.  Her WBC showed slightly elevated white blood cell count at 11.3 and slightly elevated RBCs at 5.26.  Her lipase was within normal limits.  Her cardiac troponin was less than 15.  Her chest x-ray showed no acute cardiopulmonary abnormalities.  It was felt that her symptoms may be related to stress, GI process, or be MSK in nature.  It was felt that her pain was somewhat chronic.  She was referred to cardiology for further evaluation.  She presents to the clinic today to establish care and for evaluation of her chest pain.  She states she has occasional intermittent episodes of brief chest discomfort which she describes as tingling.  This is nonexertional.  She reports that the episodes last for seconds and then dissipate without intervention.  She reports that she had an episode of dizziness about 1 week ago.  That last for 30 minutes.  She had not eaten that day.  She reports that she hydrates well.  She does report shortness of breath that has been present since having COVID.  This has been present stable for several years.  We reviewed options for further prognostication.  I reassured her that her chest discomfort does not appear to be related to her heart.  Appears to be related to precordial pain.  I will order a magnesium level, 7-day cardiac  event monitor and start ezetimibe  10 mg daily.  We will also repeat fasting lipids and LFTs in about 8 weeks.  Today she denies chest pain, shortness of breath, lower extremity edema, fatigue, palpitations, melena, hematuria, hemoptysis, diaphoresis, weakness, presyncope, syncope, orthopnea, and PND.   Home Medications    Prior to Admission medications  Medication Sig Start Date End Date Taking? Authorizing Provider  atorvastatin  (LIPITOR) 20 MG tablet Take 1 tablet (20 mg total) by mouth daily. Patient not taking: Reported on 02/23/2024 02/12/24   O'Sullivan, Melissa, NP  lidocaine  (HM LIDOCAINE  PATCH) 4 % Place 1 patch onto the skin daily. 12/28/23    O'Sullivan, Melissa, NP  lisinopril  (ZESTRIL ) 20 MG tablet Take 1 tablet (20 mg total) by mouth daily. 12/28/23   O'Sullivan, Melissa, NP  potassium chloride  SA (KLOR-CON  M) 20 MEQ tablet Take 1 tablet (20 mEq total) by mouth daily. Patient not taking: Reported on 02/23/2024 12/28/23   O'Sullivan, Melissa, NP  Replens Vaginal Moisturizer GEL Place 1 Application vaginally daily. 03/02/24   Ajewole, Christana, MD  Semaglutide ,0.25 or 0.5MG /DOS, (OZEMPIC , 0.25 OR 0.5 MG/DOSE,) 2 MG/1.5ML SOPN 0.25mg  sq weekly for 4 weeks, then 0.5mg  weekly thereafter Patient not taking: Reported on 02/23/2024 12/28/23   Daryl Setter, NP    Family History    Family History  Problem Relation Age of Onset   Hypertension Mother    Neuropathy Mother    Hypertension Father    Diabetes Mellitus II Father    Diabetes Sister        pre-diabetic   Diabetes Mellitus II Brother    Dementia Maternal Grandmother    Heart attack Maternal Grandmother 48   Diabetes Mellitus II Paternal Grandmother    Diabetes Mellitus II Paternal Aunt    Diabetes Mellitus II Paternal Aunt    Diabetes Mellitus II Paternal Uncle    Diabetes Mellitus II Paternal Uncle    She indicated that her mother is alive. She indicated that her father is deceased. She indicated that her sister is alive. She indicated that her brother is alive. She indicated that her maternal grandmother is deceased. She indicated that her maternal grandfather is deceased. She indicated that her paternal grandmother is deceased. She indicated that her paternal grandfather is deceased. She indicated that both of her paternal aunts are deceased. She indicated that one of her two paternal uncles is deceased.  Social History    Social History   Socioeconomic History   Marital status: Single    Spouse name: Not on file   Number of children: 0   Years of education: Not on file   Highest education level: Associate degree: academic program  Occupational History    Occupation: Agricultural Engineer: [employer]  Tobacco Use   Smoking status: Never   Smokeless tobacco: Never  Vaping Use   Vaping status: Never Used  Substance and Sexual Activity   Alcohol use: Not Currently    Alcohol/week: 1.0 standard drink of alcohol    Types: 1 Standard drinks or equivalent per week   Drug use: No   Sexual activity: Not Currently    Partners: Female, Female    Birth control/protection: None  Other Topics Concern   Not on file  Social History Narrative   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)   Lives with mother   No children   Single, never married   Completed associates, she is in school for [employer] in Criminal Justice/Human  services   Enjoys walking, reading, writing   No pets   Social Drivers of Health   Tobacco Use: Low Risk (02/06/2024)   Patient History    Smoking Tobacco Use: Never    Smokeless Tobacco Use: Never    Passive Exposure: Not on file  Financial Resource Strain: Medium Risk (08/19/2023)   Overall Financial Resource Strain (CARDIA)    Difficulty of Paying Living Expenses: Somewhat hard  Food Insecurity: No Food Insecurity (02/23/2024)   Epic    Worried About Radiation Protection Practitioner of Food in the Last Year: Never true    Ran Out of Food in the Last Year: Never true  Transportation Needs: No Transportation Needs (01/13/2024)   Epic    Lack of Transportation (Medical): No    Lack of Transportation (Non-Medical): No  Physical Activity: Inactive (08/19/2023)   Exercise Vital Sign    Days of Exercise per Week: 0 days    Minutes of Exercise per Session: Not on file  Stress: Stress Concern Present (08/19/2023)   [employer] of Occupational Health - Occupational Stress Questionnaire    Feeling of Stress: To some extent  Social Connections: Moderately Isolated (08/19/2023)   Social Connection and Isolation Panel    Frequency of Communication with Friends and Family: More than three times a week    Frequency of  Social Gatherings with Friends and Family: Once a week    Attends Religious Services: More than 4 times per year    Active Member of [employer] or Organizations: No    Attends Banker Meetings: Not on file    Marital Status: Never married  Intimate Partner Violence: Not At Risk (02/23/2024)   Epic    Fear of Current or Ex-Partner: No    Emotionally Abused: No    Physically Abused: No    Sexually Abused: No  Depression (PHQ2-9): Low Risk (02/23/2024)   Depression (PHQ2-9)    PHQ-2 Score: 0  Recent Concern: Depression (PHQ2-9) - Medium Risk (01/13/2024)   Depression (PHQ2-9)    PHQ-2 Score: 5  Alcohol Screen: Low Risk (01/13/2024)   Alcohol Screen    Last Alcohol Screening Score (AUDIT): 0  Housing: Unknown (02/23/2024)   Epic    Unable to Pay for Housing in the Last Year: No    Number of Times Moved in the Last Year: Not on file    Homeless in the Last Year: No  Utilities: Not At Risk (02/23/2024)   Epic    Threatened with loss of utilities: No  Health Literacy: Adequate Health Literacy (10/08/2022)   B1300 Health Literacy    Frequency of need for help with medical instructions: Never     Review of Systems    General:  No chills, fever, night sweats or weight changes.  Cardiovascular:  No chest pain, dyspnea on exertion, edema, orthopnea, palpitations, paroxysmal nocturnal dyspnea. Dermatological: No rash, lesions/masses Respiratory: No cough, dyspnea Urologic: No hematuria, dysuria Abdominal:   No nausea, vomiting, diarrhea, bright red blood per rectum, melena, or hematemesis Neurologic:  No visual changes, wkns, changes in mental status. All other systems reviewed and are otherwise negative except as noted above.  Physical Exam    VS:  BP 135/85   Pulse 81   Ht 5' 5.5 (1.664 m)   Wt 190 lb 0.6 oz (86.2 kg)   LMP 11/07/2019   SpO2 97%   BMI 31.14 kg/m  , BMI Body mass index is 31.14 kg/m. GEN: Well nourished, well developed, in no acute  distress. HEENT:  normal. Neck: Supple, no JVD, carotid bruits, or masses. Cardiac: RRR, no murmurs, rubs, or gallops. No clubbing, cyanosis, edema.  Radials/DP/PT 2+ and equal bilaterally.  Respiratory:  Respirations regular and unlabored, clear to auscultation bilaterally. GI: Soft, nontender, nondistended, BS + x 4. MS: no deformity or atrophy. Skin: warm and dry, no rash. Neuro:  Strength and sensation are intact. Psych: Normal affect.  Accessory Clinical Findings    Recent Labs: 01/13/2024: BUN 14; Creatinine 0.79; Hemoglobin 13.7; Platelet Count 292; Potassium 3.9; Sodium 142 02/03/2024: ALT 25   Recent Lipid Panel    Component Value Date/Time   CHOL 231 (H) 02/03/2024 0802   TRIG 170.0 (H) 02/03/2024 0802   HDL 42.10 02/03/2024 0802   CHOLHDL 5 02/03/2024 0802   VLDL 34.0 02/03/2024 0802   LDLCALC 154 (H) 02/03/2024 0802   LDLCALC 93 12/28/2023 1557   LDLDIRECT 174 (H) 12/24/2009 2053         ECG personally reviewed by me today- EKG Interpretation Date/Time:  Friday March 09 2024 14:56:29 EST Ventricular Rate:  81 PR Interval:  152 QRS Duration:  72 QT Interval:  408 QTC Calculation: 473 R Axis:   -11  Text Interpretation: Normal sinus rhythm Confirmed by Emelia Hazy [phone]) on 03/09/2024 3:30:59 PM         Assessment & Plan   1.  Palpitations- reports palpitations each night.  Reports an episode of dizziness that lasted for 30 minutes last week.  She has not had any further episodes of lightheadedness, presyncope and syncope.  She reports hydrating well.  During that event she remembered that she had not eaten through the day. Order magnesium Avoid triggers 7 day ZIO monitor  Chest discomfort-no chest pain today.  Reports intermittent episodes of chest discomfort over the past several months.  Denies exertional chest pain.  Emergency department visit 10/25 showed elevated liver enzymes and a cardiac troponin of less than 15.  Chest x-ray was unremarkable.  Chest pain  appears to be precordial in nature. Heart healthy low-sodium diet Increase physical activity as tolerated No plans for ischemic evaluation  Hyperlipidemia-LDL 154 on 02/03/2024.  Total cholesterol 231, triglycerides 170.  Elevated ALT ST with statin therapy.  No longer taking atorvastatin . High-fiber diet Increase physical activity as tolerated Start ezetimibe  10 mg daily Ordered fasting lipids and LFTs in 8 weeks  Essential hypertension-BP today 135/85. Maintain blood pressure log Heart healthy low-sodium diet Avoid secondary causes of hypertension Continue lisinopril   Type 2 diabetes-A1c 7.2 on 12/28/2023 Carb modified diet Continue weight loss Continue semaglutide .   Follows with PCP  Disposition: Follow-up with Dr. Kate or me in 6 weeks.   Hazy HERO. Rukia Mcgillivray NP-C     03/09/2024, 3:31 PM [facility] [address] [place], [place] [address] Office [phone]    Notice: This dictation was prepared with Dragon dictation along with smaller phrase technology. Any transcriptional errors that result from this process are unintentional and may not be corrected upon review.   I spent 14 minutes examining this patient, reviewing medications, and using patient centered shared decision making involving their cardiac care.   I spent  20 minutes reviewing past medical history,  medications, and prior cardiac tests.     [1]  Allergies Allergen Reactions   Cyclobenzaprine  Nausea Only   Hydrocodone -Apap-Dietary Prod Nausea And Vomiting   Ibuprofen  Nausea And Vomiting        Oxycodone  Other (See Comments)    Upset  stomach   Tylenol  [Acetaminophen ] Nausea Only   "

## 2024-03-08 ENCOUNTER — Ambulatory Visit (HOSPITAL_BASED_OUTPATIENT_CLINIC_OR_DEPARTMENT_OTHER)
Admission: RE | Admit: 2024-03-08 | Discharge: 2024-03-08 | Disposition: A | Payer: Self-pay | Source: Ambulatory Visit | Attending: Family | Admitting: Family

## 2024-03-08 ENCOUNTER — Encounter (HOSPITAL_BASED_OUTPATIENT_CLINIC_OR_DEPARTMENT_OTHER): Payer: Self-pay

## 2024-03-08 DIAGNOSIS — Z1231 Encounter for screening mammogram for malignant neoplasm of breast: Secondary | ICD-10-CM

## 2024-03-09 ENCOUNTER — Encounter: Payer: Self-pay | Admitting: General Practice

## 2024-03-09 ENCOUNTER — Ambulatory Visit: Admitting: General Practice

## 2024-03-09 ENCOUNTER — Ambulatory Visit

## 2024-03-09 ENCOUNTER — Other Ambulatory Visit: Payer: Self-pay

## 2024-03-09 ENCOUNTER — Other Ambulatory Visit (HOSPITAL_BASED_OUTPATIENT_CLINIC_OR_DEPARTMENT_OTHER): Payer: Self-pay

## 2024-03-09 VITALS — BP 135/85 | HR 81 | Ht 65.5 in | Wt 190.0 lb

## 2024-03-09 DIAGNOSIS — R0789 Other chest pain: Secondary | ICD-10-CM

## 2024-03-09 DIAGNOSIS — E119 Type 2 diabetes mellitus without complications: Secondary | ICD-10-CM

## 2024-03-09 DIAGNOSIS — E782 Mixed hyperlipidemia: Secondary | ICD-10-CM

## 2024-03-09 DIAGNOSIS — R002 Palpitations: Secondary | ICD-10-CM

## 2024-03-09 DIAGNOSIS — I10 Essential (primary) hypertension: Secondary | ICD-10-CM

## 2024-03-09 MED ORDER — EZETIMIBE 10 MG PO TABS
10.0000 mg | ORAL_TABLET | Freq: Every day | ORAL | 1 refills | Status: AC
  Filled 2024-03-09: qty 30, 30d supply, fill #0

## 2024-03-09 NOTE — Patient Instructions (Addendum)
 Medication Instructions:  Your physician has recommended you make the following change in your medication: Zetia  10 mg by mouth daily  *If you need a refill on your cardiac medications before your next appointment, please call your pharmacy*  Lab Work: Your provider has ordered a lab work Magnesium to have done on Monday 03/12/24 at any [facility].   Your physician recommends that you return for lab work in: 8 weeks for fasting lipid and liver panel  If you have labs (blood work) drawn today and your tests are completely normal, you will receive your results only by: MyChart Message (if you have MyChart) OR A paper copy in the mail If you have any lab test that is abnormal or we need to change your treatment, we will call you to review the results.  Testing/Procedures: Your physician has recommended that you wear an 7 day monitor. Monitors are medical devices that record the hearts electrical activity. Doctors most often us  these monitors to diagnose arrhythmias. Arrhythmias are problems with the speed or rhythm of the heartbeat. The monitor is a small, portable device. You can wear one while you do your normal daily activities. This is usually used to diagnose what is causing palpitations/syncope (passing out).   Follow-Up: At [facility], you and your health needs are our priority.  As part of our continuing mission to provide you with exceptional heart care, our providers are all part of one team.  This team includes your primary Cardiologist (physician) and Advanced Practice Providers or APPs (Physician Assistants and Nurse Practitioners) who all work together to provide you with the care you need, when you need it.  Your next appointment:   6 week(s)  Provider:   Josefa Beauvais, NP          We recommend signing up for the patient portal called MyChart.  Sign up information is provided on this After Visit Summary.  MyChart is used to connect with patients for Virtual Visits  (Telemedicine).  Patients are able to view lab/test results, encounter notes, upcoming appointments, etc.  Non-urgent messages can be sent to your provider as well.   To learn more about what you can do with MyChart, go to [email or web address].   Other Instructions ZIO XT- Long Term Monitor Instructions  Your physician has requested you wear a ZIO patch monitor for 7 days.  This is a single patch monitor. [employer] supplies one patch monitor per enrollment. Additional stickers are not available. Please do not apply patch if you will be having a Nuclear Stress Test,  Echocardiogram, Cardiac CT, MRI, or Chest Xray during the period you would be wearing the  monitor. The patch cannot be worn during these tests. You cannot remove and re-apply the  ZIO XT patch monitor.  Your ZIO patch monitor will be mailed 3 day USPS to your address on file. It may take 3-5 days  to receive your monitor after you have been enrolled.  Once you have received your monitor, please review the enclosed instructions. Your monitor  has already been registered assigning a specific monitor serial # to you.  Billing and Patient Assistance Program Information  We have supplied [employer] with any of your insurance information on file for billing purposes. [employer] offers a sliding scale Patient Assistance Program for patients that do not have  insurance, or whose insurance does not completely cover the cost of the ZIO monitor.  You must apply for the Patient Assistance Program to qualify for this discounted rate.  To apply, please call [employer] at [phone], select option 4, select option 2, ask to apply for  Patient Assistance Program. Meredeth will ask your household income, and how many people  are in your household. They will quote your out-of-pocket cost based on that information.  [employer] will also be able to set up a 53-month, interest-free payment plan if needed.  Applying the monitor   Shave hair from upper  left chest.  Hold abrader disc by orange tab. Rub abrader in 40 strokes over the upper left chest as  indicated in your monitor instructions.  Clean area with 4 enclosed alcohol pads. Let dry.  Apply patch as indicated in monitor instructions. Patch will be placed under collarbone on left  side of chest with arrow pointing upward.  Rub patch adhesive wings for 2 minutes. Remove white label marked 1. Remove the white  label marked 2. Rub patch adhesive wings for 2 additional minutes.  While looking in a mirror, press and release button in center of patch. A small green light will  flash 3-4 times. This will be your only indicator that the monitor has been turned on.  Do not shower for the first 24 hours. You may shower after the first 24 hours.  Press the button if you feel a symptom. You will hear a small click. Record Date, Time and  Symptom in the Patient Logbook.  When you are ready to remove the patch, follow instructions on the last 2 pages of Patient  Logbook. Stick patch monitor onto the last page of Patient Logbook.  Place Patient Logbook in the blue and white box. Use locking tab on box and tape box closed  securely. The blue and white box has prepaid postage on it. Please place it in the mailbox as  soon as possible. Your physician should have your test results approximately 7 days after the  monitor has been mailed back to [facility].  Call [facility] Customer Care at [phone] if you have questions regarding  your ZIO XT patch monitor. Call them immediately if you see an orange light blinking on your  monitor.  If your monitor falls off in less than 4 days, contact our Monitor department at [phone].  If your monitor becomes loose or falls off after 4 days call [employer] at [phone] for  suggestions on securing your monitor

## 2024-03-09 NOTE — Progress Notes (Unsigned)
Enrolled patient for a 7 day Zio XT monitor to be mailed to patients home  Diane Sexton to read

## 2024-03-12 ENCOUNTER — Ambulatory Visit

## 2024-03-15 ENCOUNTER — Ambulatory Visit: Payer: Self-pay

## 2024-03-15 ENCOUNTER — Telehealth: Admitting: *Deleted

## 2024-03-23 ENCOUNTER — Ambulatory Visit: Admitting: Adult Health

## 2024-03-26 ENCOUNTER — Encounter

## 2024-03-28 ENCOUNTER — Ambulatory Visit: Payer: Self-pay | Admitting: Gastroenterology

## 2024-03-30 ENCOUNTER — Ambulatory Visit: Admitting: Obstetrics and Gynecology

## 2024-03-30 ENCOUNTER — Ambulatory Visit: Admitting: Family

## 2024-05-14 ENCOUNTER — Ambulatory Visit: Admitting: General Practice
